# Patient Record
Sex: Female | Born: 1945 | Race: Black or African American | Hispanic: No | Marital: Married | Smoking: Never smoker
Health system: Southern US, Community
[De-identification: ages and names within clinical notes are randomized; demographics above are authoritative.]

## PROBLEM LIST (undated history)

## (undated) DIAGNOSIS — E119 Type 2 diabetes mellitus without complications: Secondary | ICD-10-CM

## (undated) DIAGNOSIS — H269 Unspecified cataract: Secondary | ICD-10-CM

## (undated) DIAGNOSIS — R011 Cardiac murmur, unspecified: Secondary | ICD-10-CM

## (undated) DIAGNOSIS — G459 Transient cerebral ischemic attack, unspecified: Secondary | ICD-10-CM

## (undated) DIAGNOSIS — I10 Essential (primary) hypertension: Secondary | ICD-10-CM

## (undated) DIAGNOSIS — D649 Anemia, unspecified: Secondary | ICD-10-CM

## (undated) HISTORY — DX: Anemia, unspecified: D64.9

## (undated) HISTORY — DX: Transient cerebral ischemic attack, unspecified: G45.9

## (undated) HISTORY — DX: Type 2 diabetes mellitus without complications: E11.9

## (undated) HISTORY — DX: Cardiac murmur, unspecified: R01.1

## (undated) HISTORY — DX: Essential (primary) hypertension: I10

## (undated) HISTORY — DX: Unspecified cataract: H26.9

---

## 2004-02-21 HISTORY — PX: DILATION AND CURETTAGE OF UTERUS: SHX78

## 2005-04-06 ENCOUNTER — Ambulatory Visit (HOSPITAL_COMMUNITY): Admission: RE | Admit: 2005-04-06 | Discharge: 2005-04-06 | Payer: Self-pay | Admitting: Family Medicine

## 2006-01-16 ENCOUNTER — Other Ambulatory Visit: Admission: RE | Admit: 2006-01-16 | Discharge: 2006-01-16 | Payer: Self-pay | Admitting: Obstetrics and Gynecology

## 2006-04-10 ENCOUNTER — Ambulatory Visit (HOSPITAL_COMMUNITY): Admission: RE | Admit: 2006-04-10 | Discharge: 2006-04-10 | Payer: Self-pay | Admitting: Family Medicine

## 2006-04-10 IMAGING — MG MM DIGITAL SCREENING BILAT
4 series · 4 of 4 positions shown · non-contrast
Comparison: none

DG SCREEN MAMMOGRAM BILATERAL
Bilateral CC and MLO view(s) were taken.
Prior study comparison: [DATE], bilateral screening mammogram.

DIGITAL SCREENING MAMMOGRAM WITH CAD:
There are scattered fibroglandular densities.  There is no dominant mass, architectural distortion 
or calcification to suggest malignancy.

[R CC]
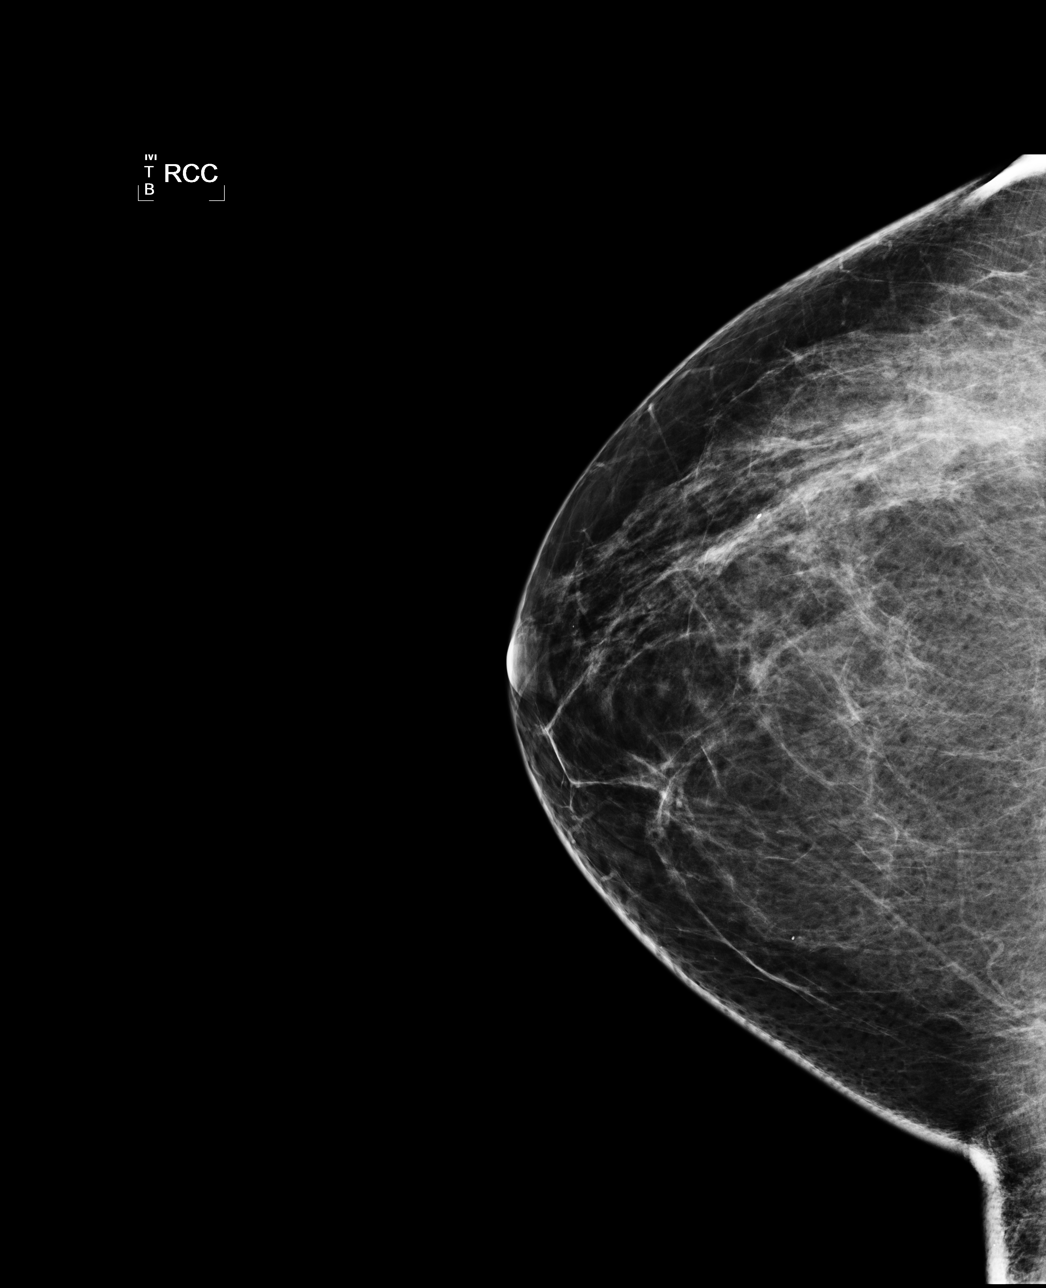

[R MLO]
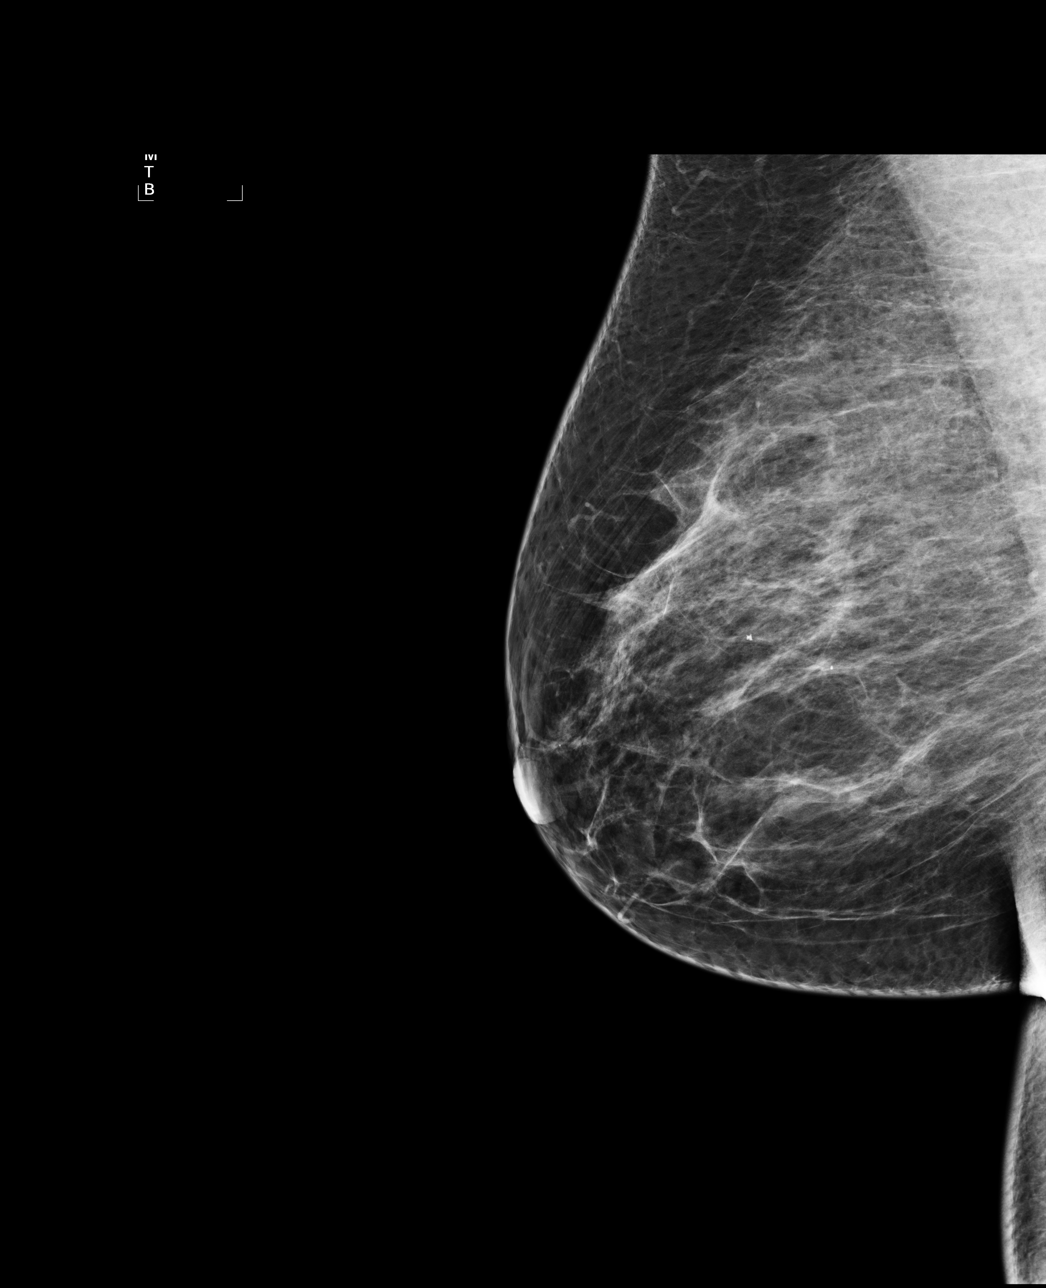

[L CC]
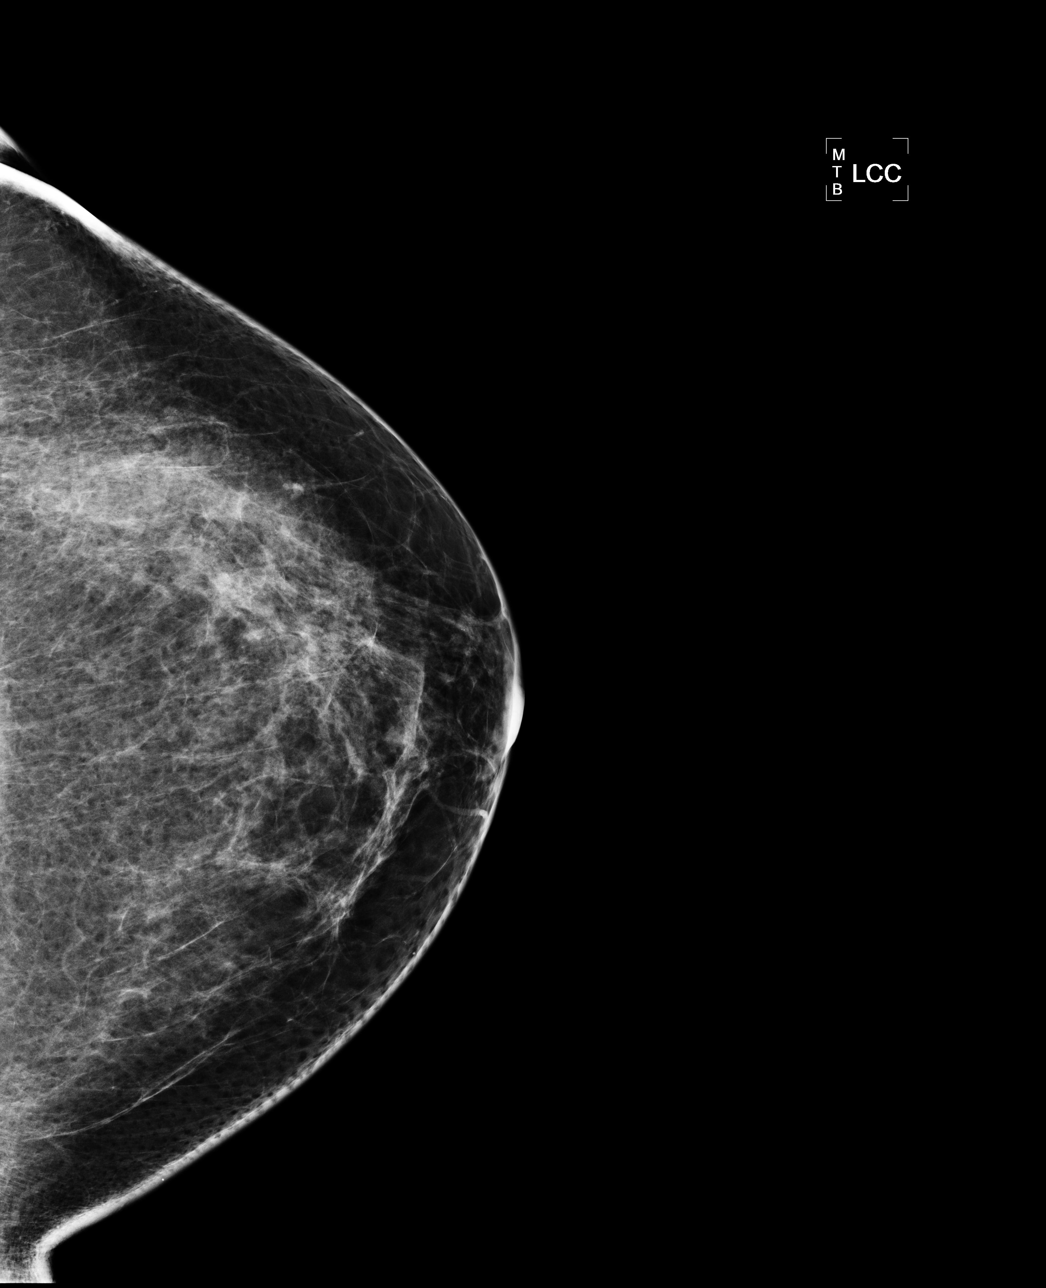

[L MLO]
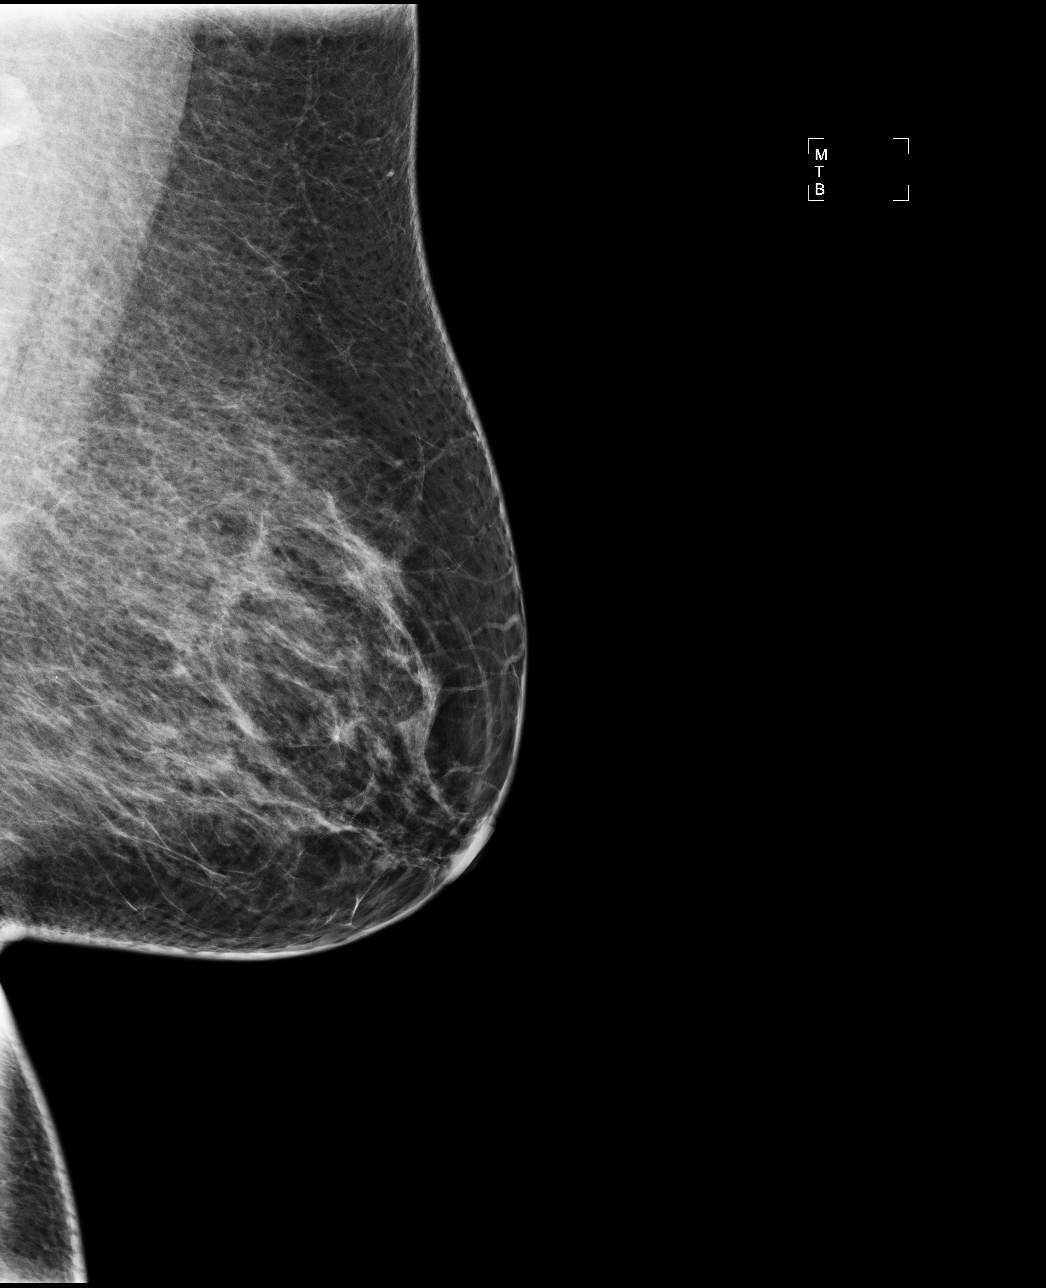

[4 of 4 positions shown; findings below may reference images not displayed]

IMPRESSION: No mammographic evidence of malignancy.  Suggest yearly screening mammography.

ASSESSMENT: Negative - BI-RADS 1

Screening mammogram in 1 year.
ANALYZED BY COMPUTER AIDED DETECTION. , THIS PROCEDURE WAS A DIGITAL MAMMOGRAM.

## 2007-04-12 ENCOUNTER — Ambulatory Visit (HOSPITAL_COMMUNITY): Admission: RE | Admit: 2007-04-12 | Discharge: 2007-04-12 | Payer: Self-pay | Admitting: Family Medicine

## 2007-04-12 IMAGING — MG MM DIGITAL SCREENING BILAT
4 series · 4 of 4 positions shown · non-contrast
Comparison: Prior studies.

DG SCREEN MAMMOGRAM BILATERAL
Bilateral CC and MLO view(s) were taken.

DIGITAL SCREENING MAMMOGRAM WITH CAD:

[R CC]
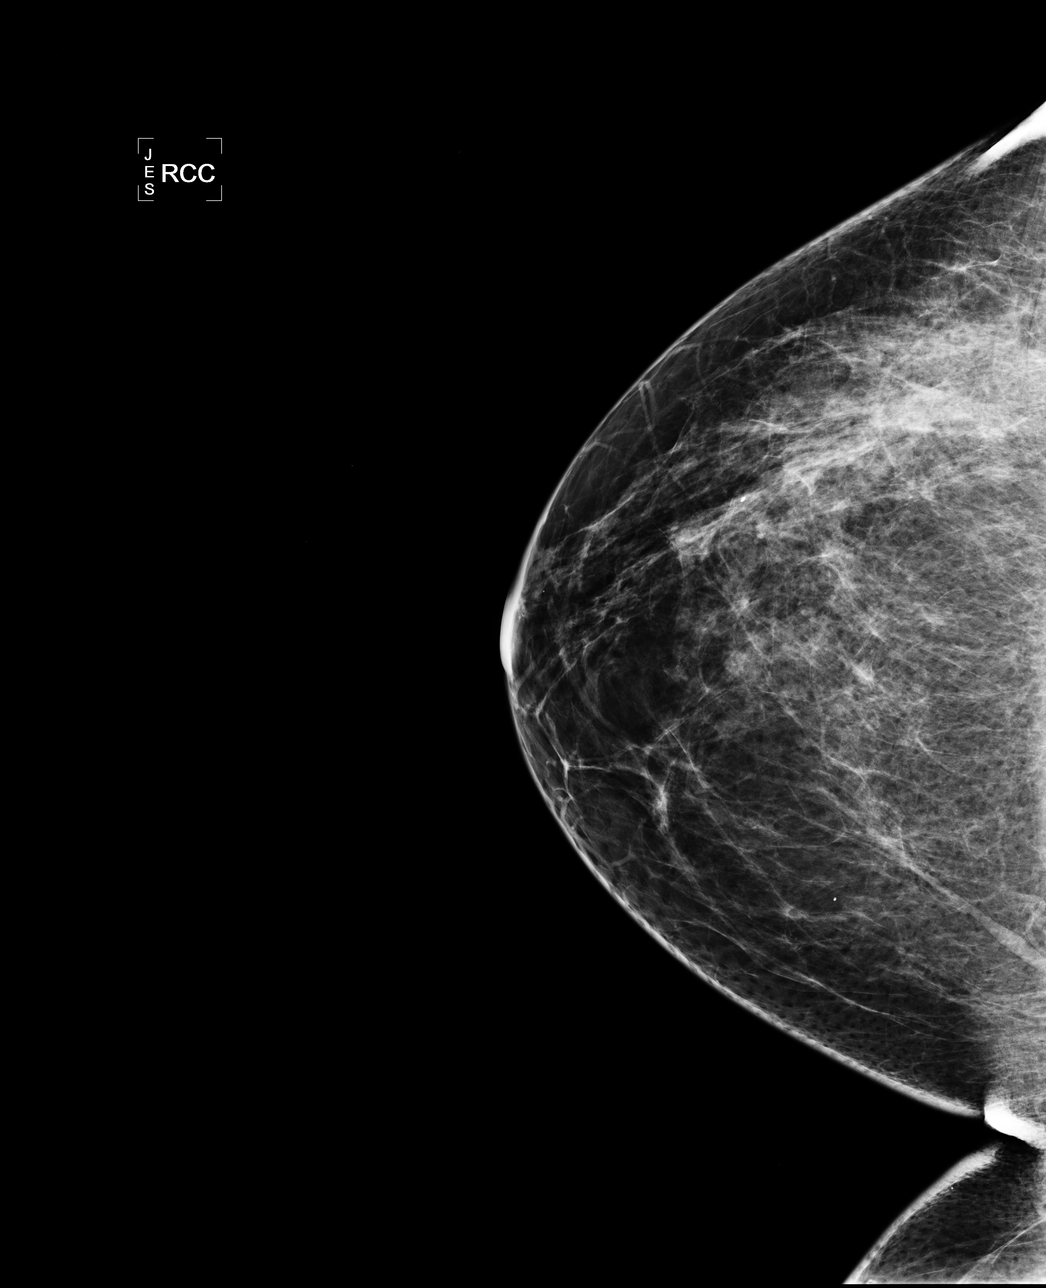

[R MLO]
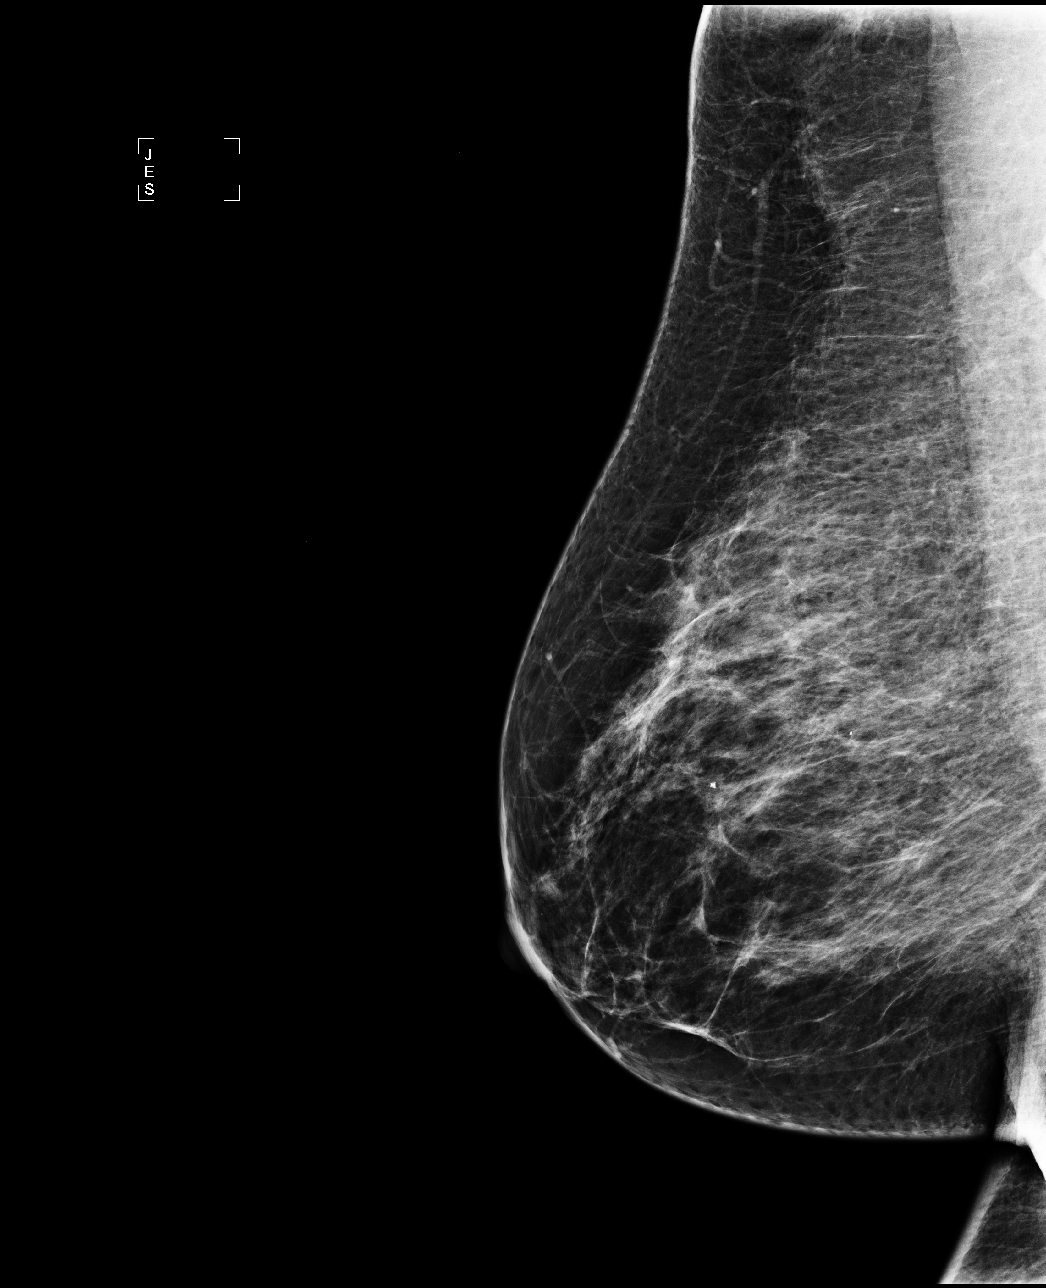

[L CC]
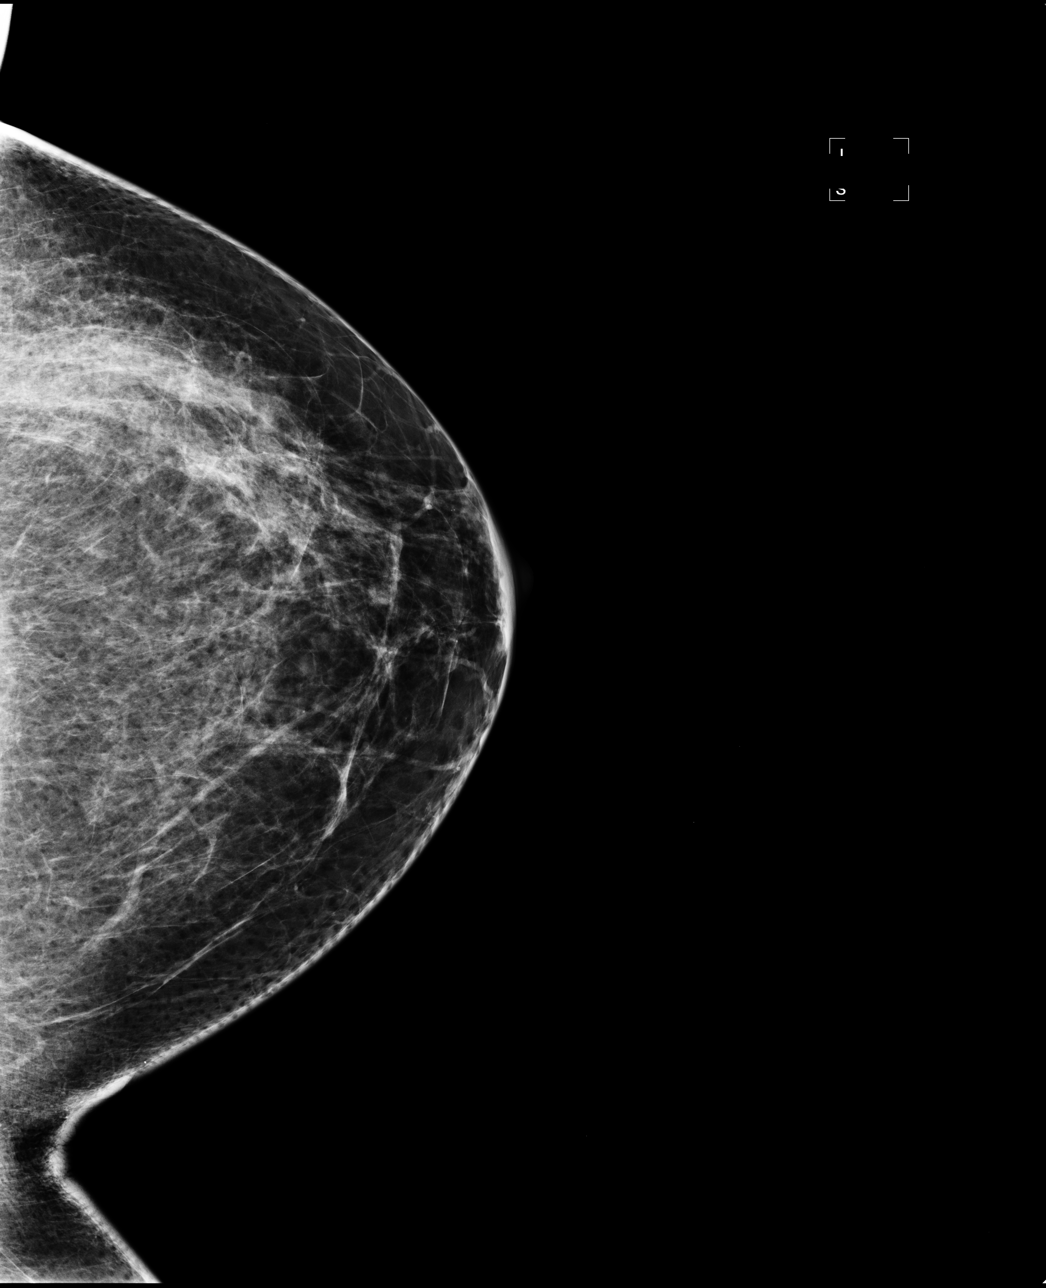

[L MLO]
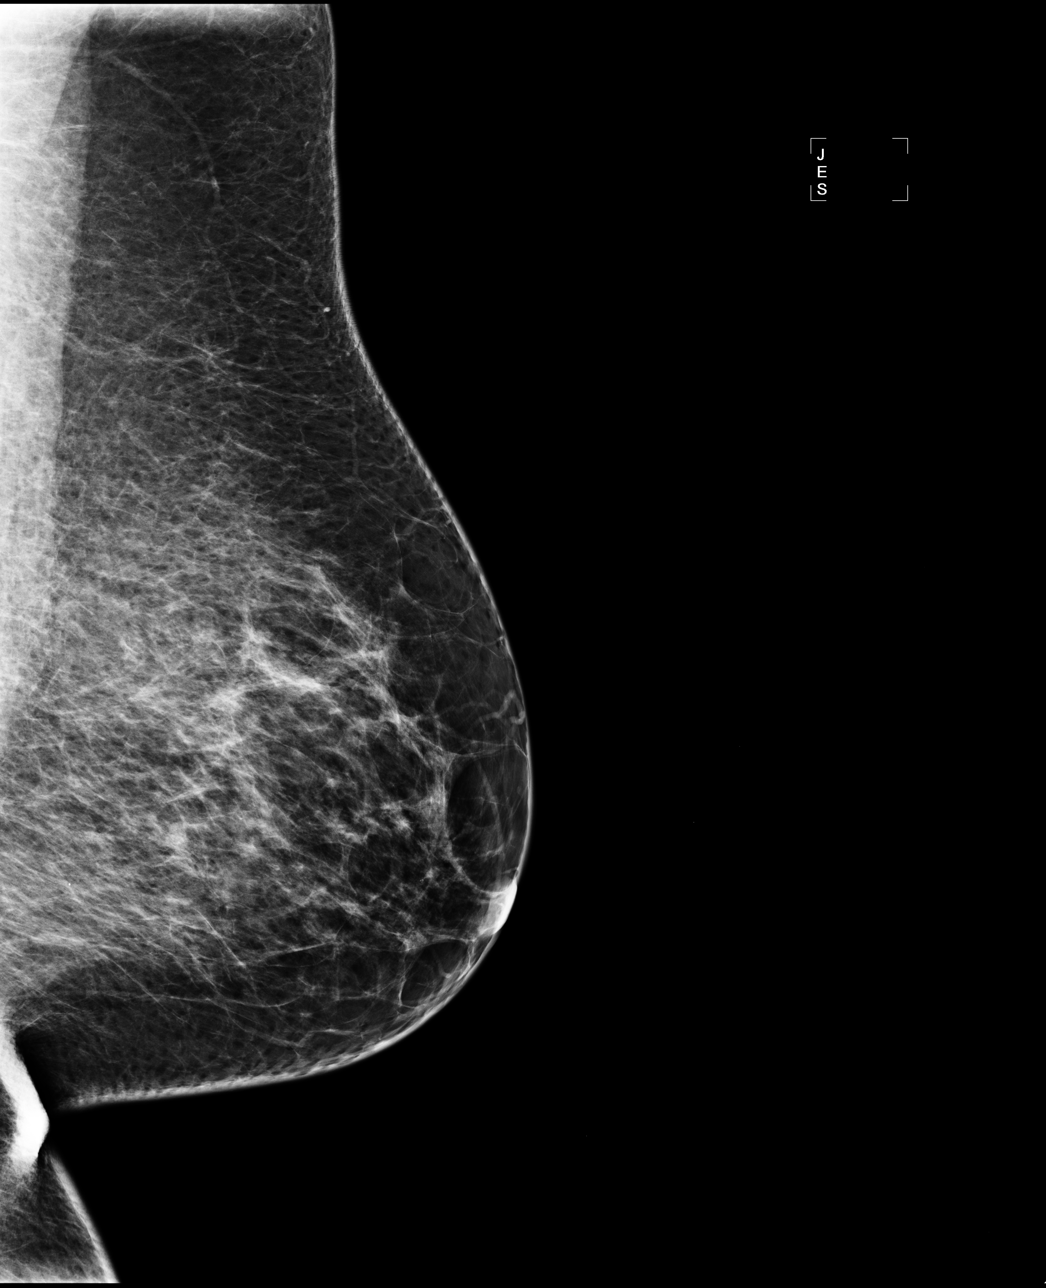

[4 of 4 positions shown; findings below may reference images not displayed]

There are scattered fibroglandular densities.  There is no dominant mass, architectural distortion 
or calcification to suggest malignancy.
IMPRESSION: No mammographic evidence of malignancy.  Suggest yearly screening mammography.

ASSESSMENT: Negative - BI-RADS 1

Screening mammogram in 1 year.
ANALYZED BY COMPUTER AIDED DETECTION. , THIS PROCEDURE WAS A DIGITAL MAMMOGRAM.

## 2008-02-07 ENCOUNTER — Other Ambulatory Visit: Admission: RE | Admit: 2008-02-07 | Discharge: 2008-02-07 | Payer: Self-pay | Admitting: Family Medicine

## 2008-04-13 ENCOUNTER — Ambulatory Visit (HOSPITAL_COMMUNITY): Admission: RE | Admit: 2008-04-13 | Discharge: 2008-04-13 | Payer: Self-pay | Admitting: Family Medicine

## 2008-04-13 IMAGING — MG MM DIGITAL SCREENING BILAT
5 series · 5 of 5 positions shown · non-contrast
Comparison: none

DG SCREEN MAMMOGRAM BILATERAL
Bilateral CC and MLO view(s) were taken.

DIGITAL SCREENING MAMMOGRAM WITH CAD:
There are scattered fibroglandular densities.  No masses or malignant type calcifications are 
identified.  Compared with prior studies.

[R CC]
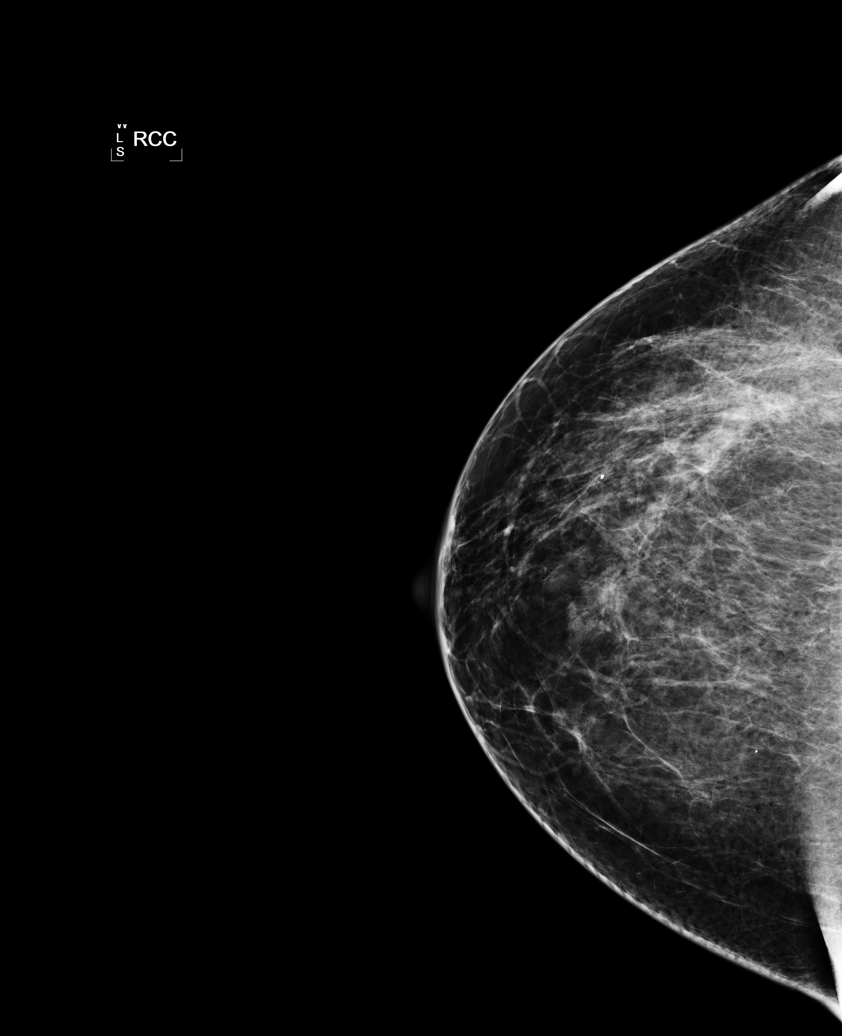

[R MLO (1 of 2)]
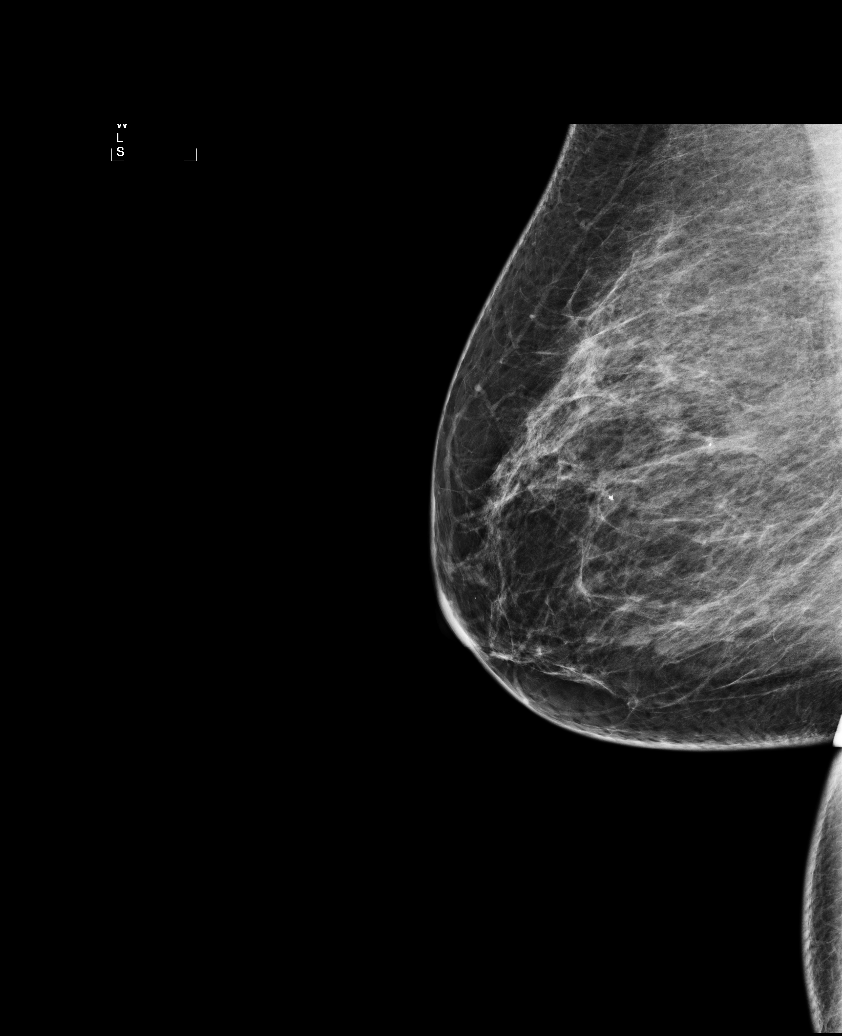

[L CC]
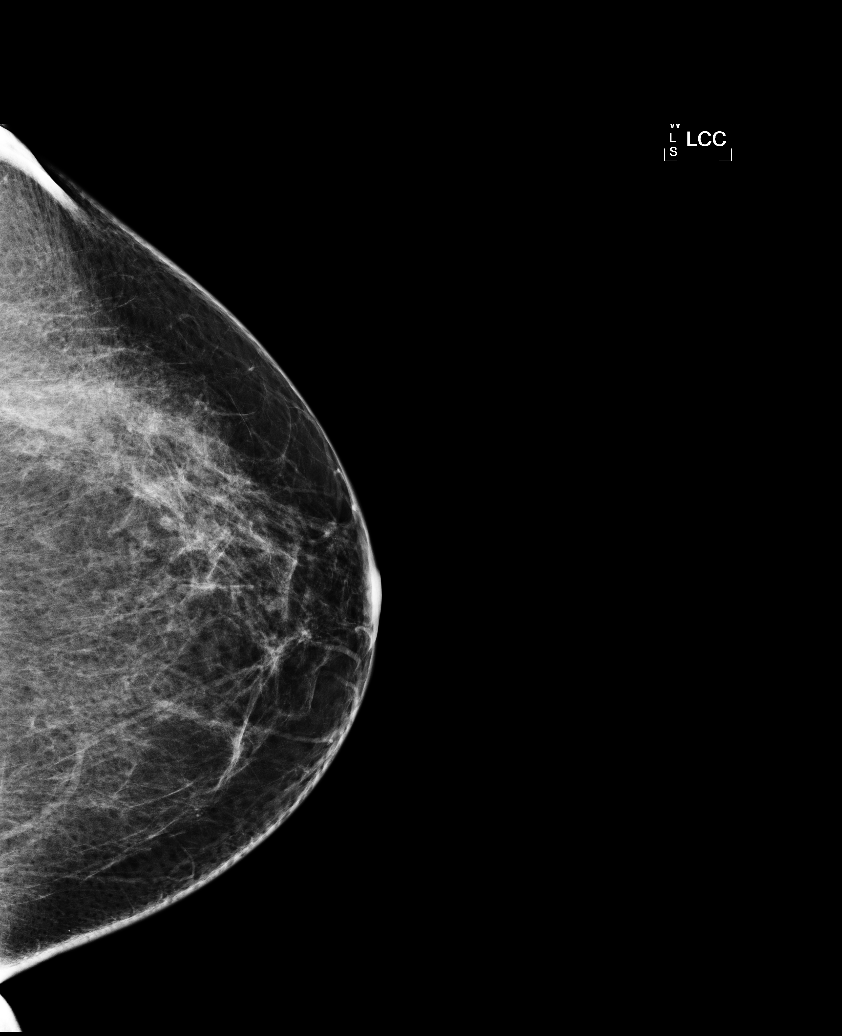

[L MLO]
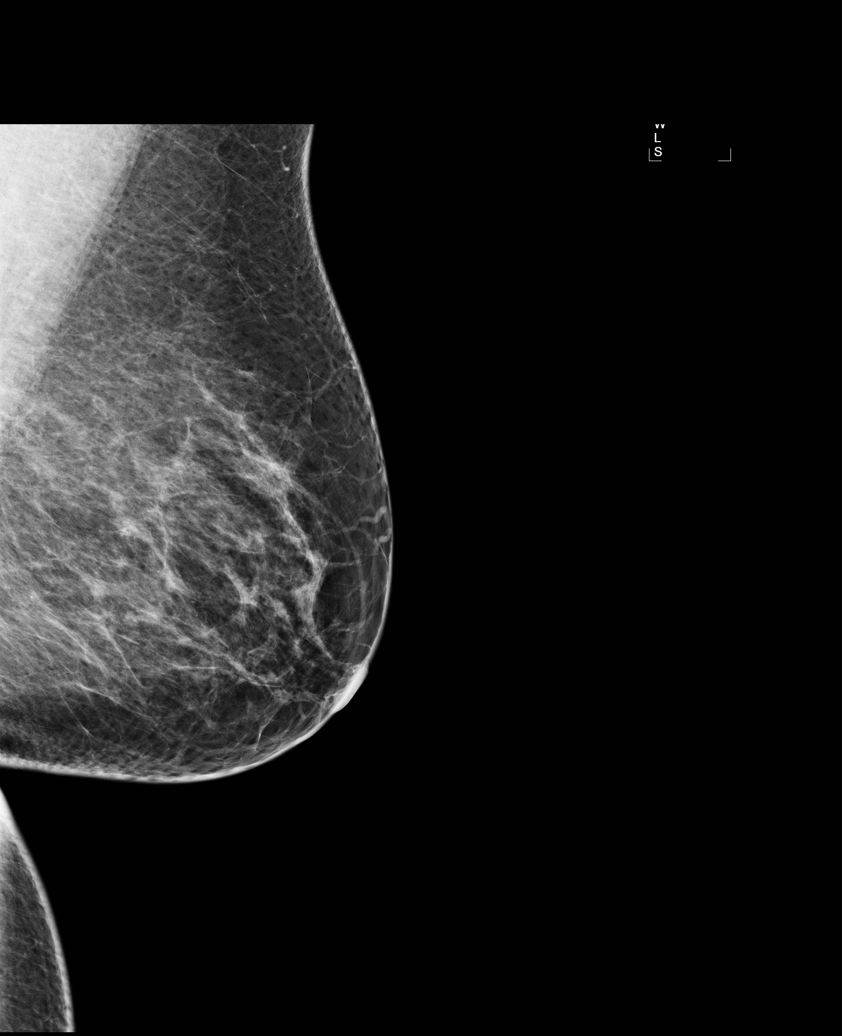

[R MLO (2 of 2)]
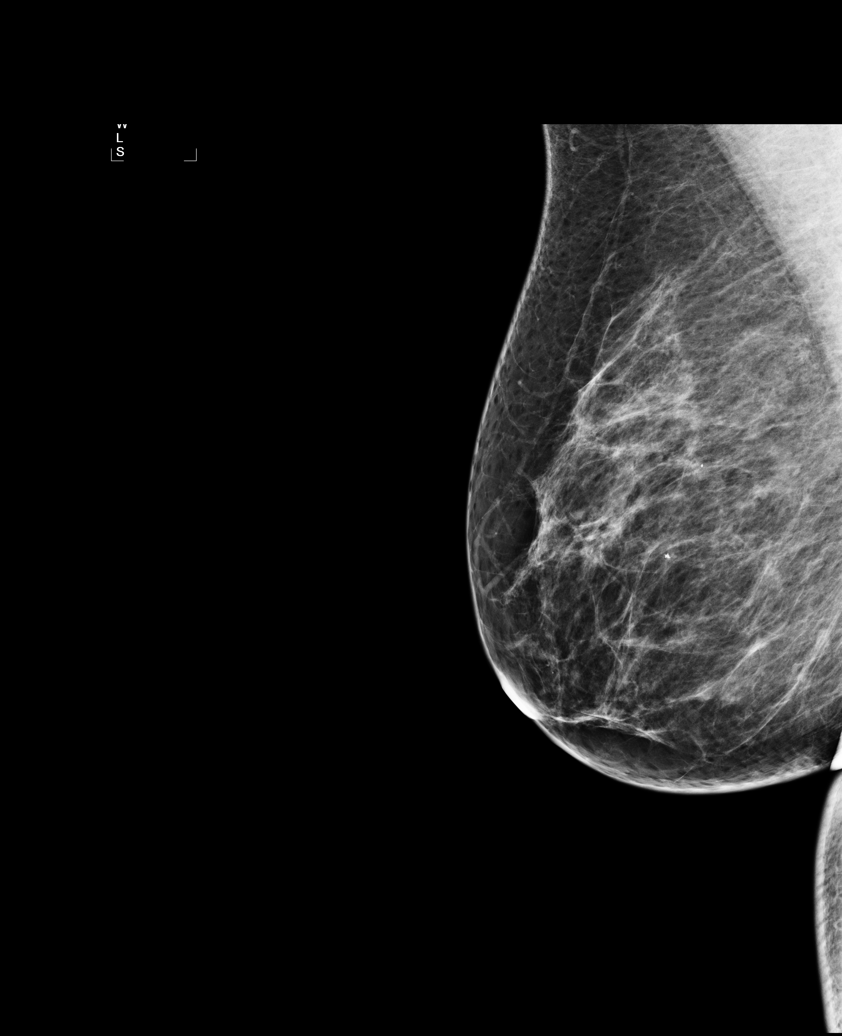

[5 of 5 positions shown; findings below may reference images not displayed]

IMPRESSION: No specific mammographic evidence of malignancy.  Next screening mammogram is recommended in one 
year.

ASSESSMENT: Negative - BI-RADS 1

Screening mammogram in 1 year.
ANALYZED BY COMPUTER AIDED DETECTION. , THIS PROCEDURE WAS A DIGITAL MAMMOGRAM.

## 2009-03-31 ENCOUNTER — Other Ambulatory Visit: Admission: RE | Admit: 2009-03-31 | Discharge: 2009-03-31 | Payer: Self-pay | Admitting: Family Medicine

## 2009-04-14 ENCOUNTER — Ambulatory Visit (HOSPITAL_COMMUNITY): Admission: RE | Admit: 2009-04-14 | Discharge: 2009-04-14 | Payer: Self-pay | Admitting: Family Medicine

## 2009-04-14 IMAGING — MG MM DIGITAL SCREENING
4 series · 4 of 4 positions shown · non-contrast
Comparison: Prior studies.

DG SCREEN MAMMOGRAM BILATERAL
Bilateral CC and MLO view(s) were taken.
Prior study comparison: [DATE], DG screen mammogram bilateral.

DIGITAL SCREENING MAMMOGRAM WITH CAD:

[R CC]
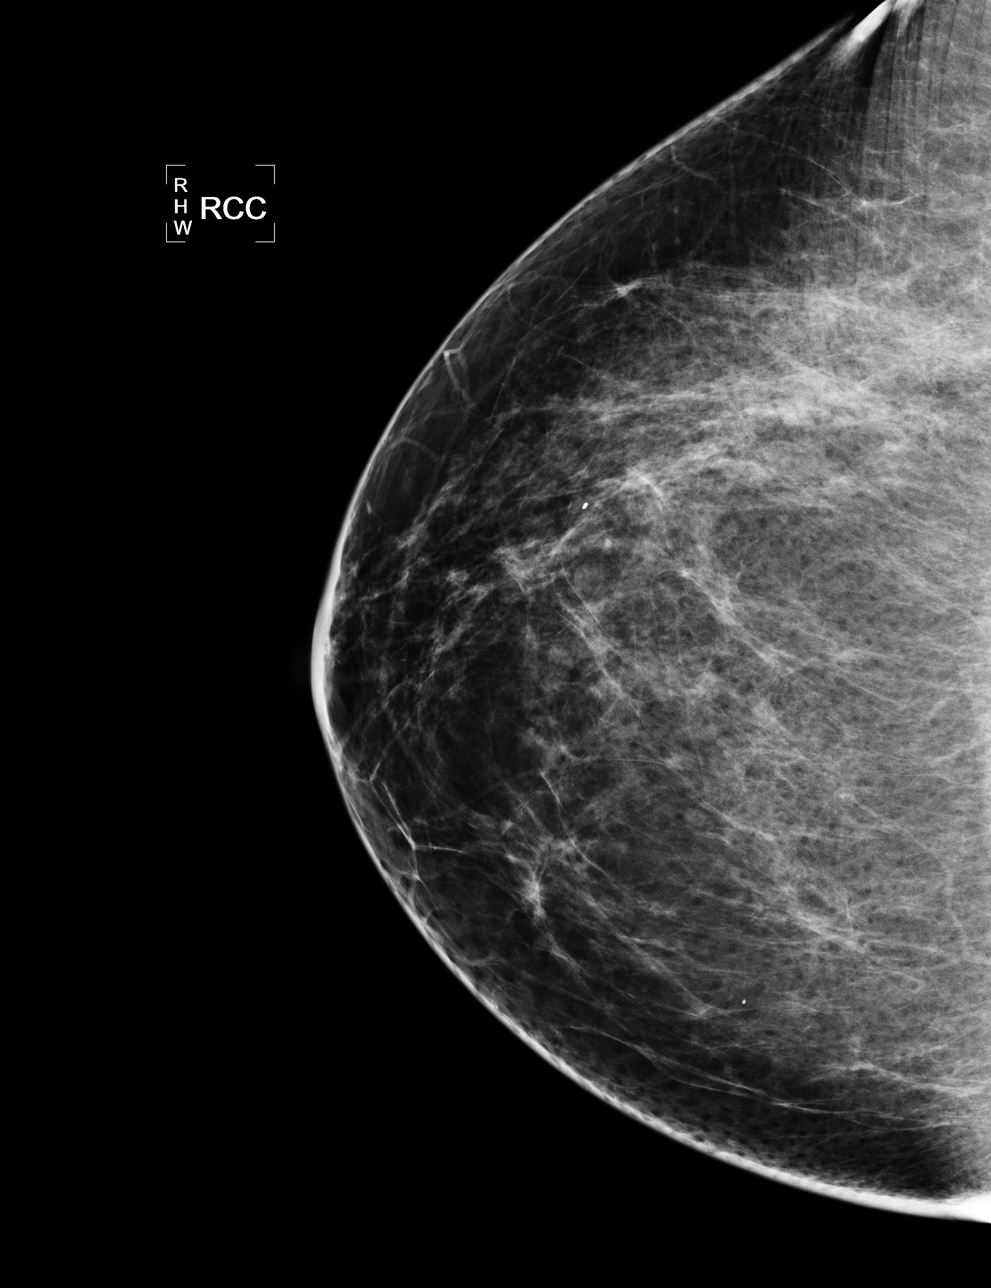

[R MLO]
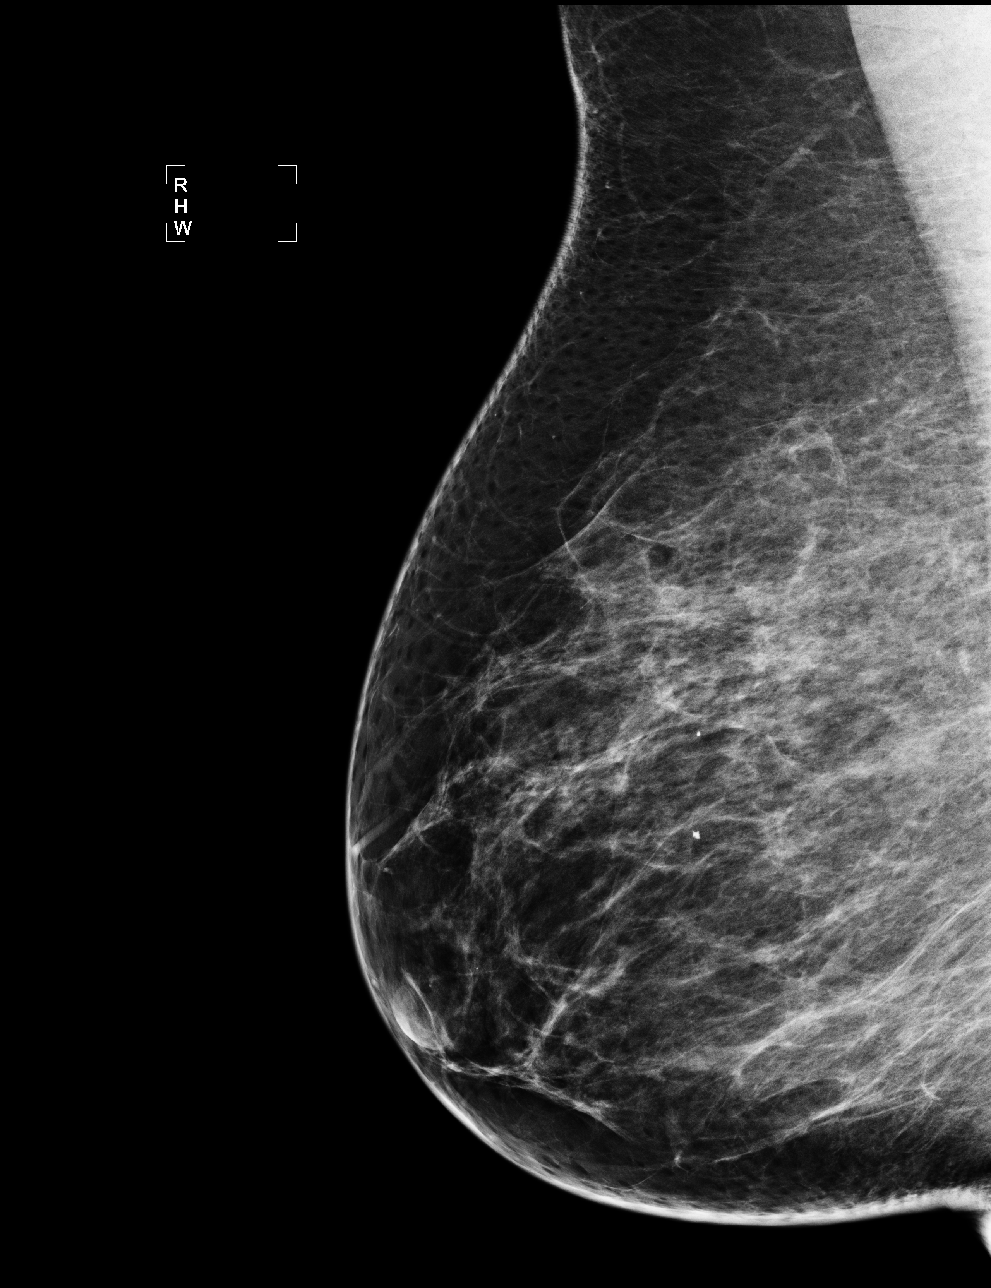

[L CC]
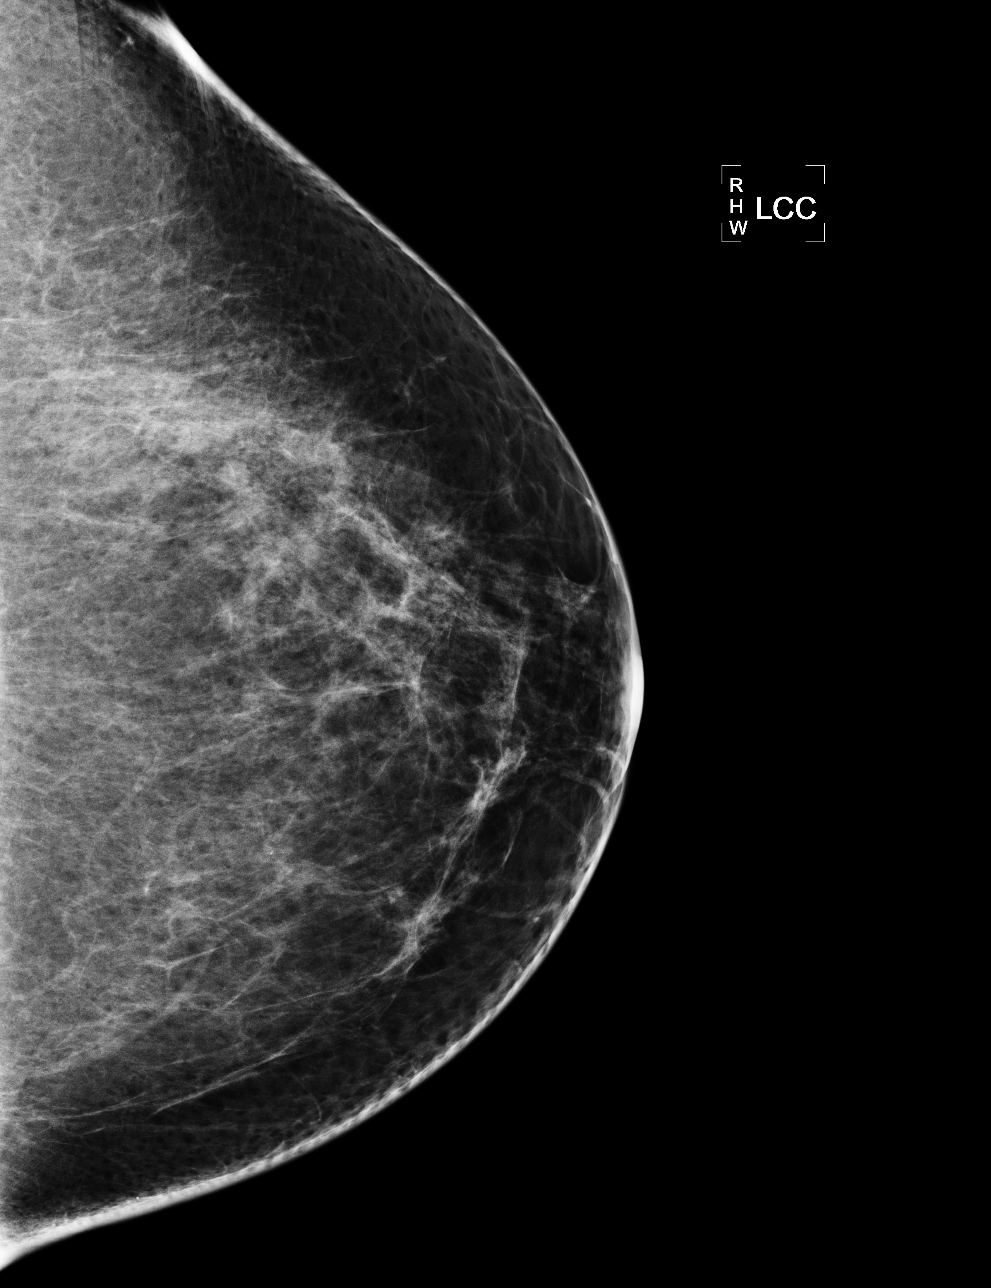

[L MLO]
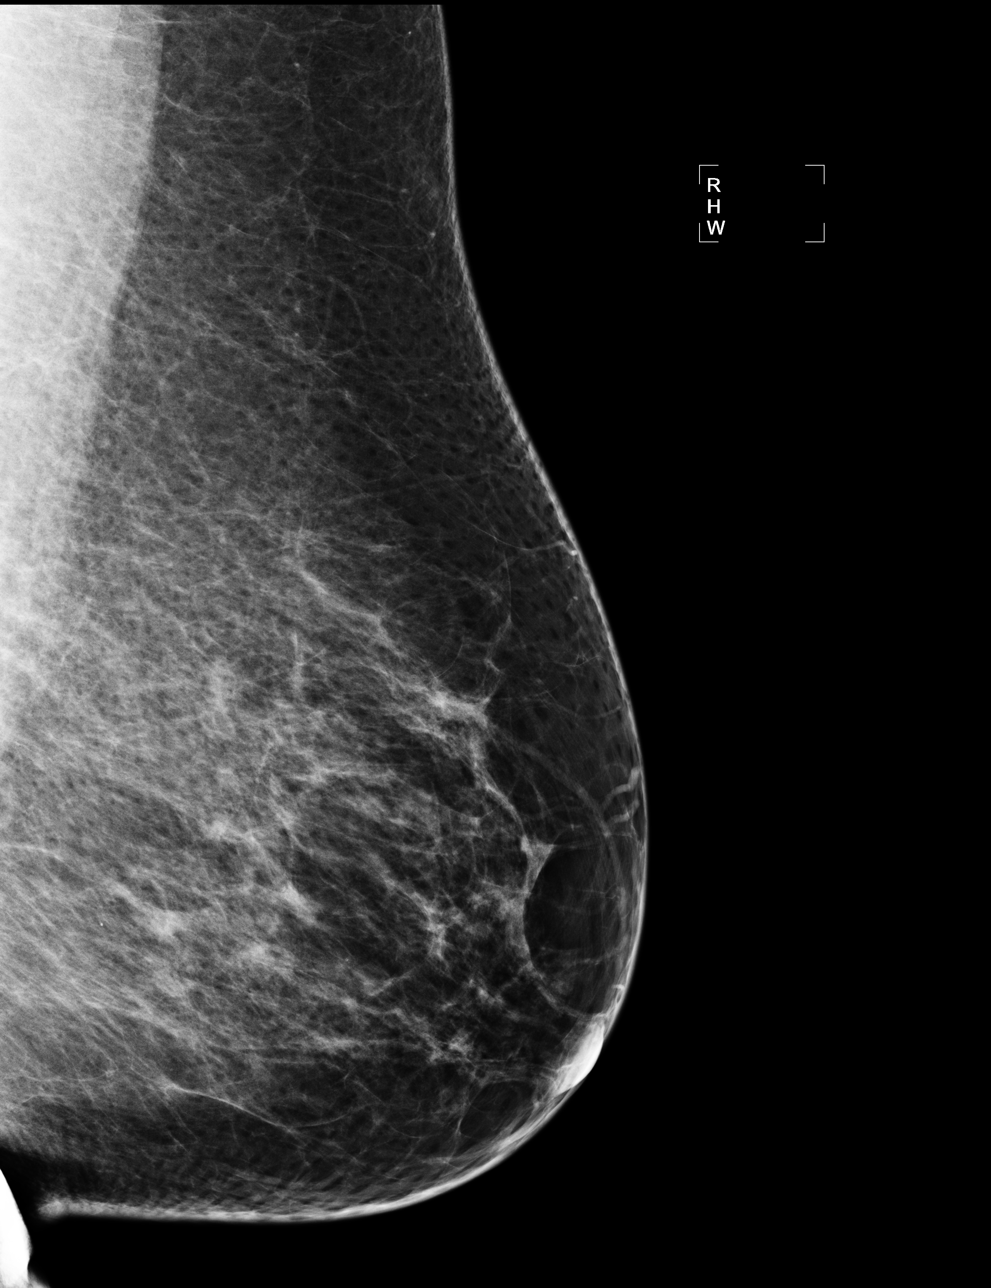

[4 of 4 positions shown; findings below may reference images not displayed]

There are scattered fibroglandular densities.  A possible mass is noted in the left breast.  Spot 
compression views and possibly sonography are recommended for further evaluation.  The right breast
is unremarkable.

Images were processed with CAD.
IMPRESSION: Possible mass, left breast.  Additional evaluation is indicated.  The patient will be contacted for
additional studies and a supplemental report will follow.

ASSESSMENT: Need additional imaging evaluation and/or prior mammograms for comparison - BI-RADS 0 -
Left

Further imaging of the left breast.
,

## 2009-04-20 ENCOUNTER — Encounter: Admission: RE | Admit: 2009-04-20 | Discharge: 2009-04-20 | Payer: Self-pay | Admitting: Family Medicine

## 2009-04-20 IMAGING — MG MM DIGITAL DIAG LTD L
3 series · 3 of 3 positions shown · non-contrast
Comparison: [DATE] and [DATE]

CLINICAL DATA: Possible mass left breast identified on recent
screening mammogram.

DIGITAL DIAGNOSTIC LEFT MAMMOGRAM WITHOUT CAD AND LEFT BREAST
ULTRASOUND:

[L CC (1 of 2)]
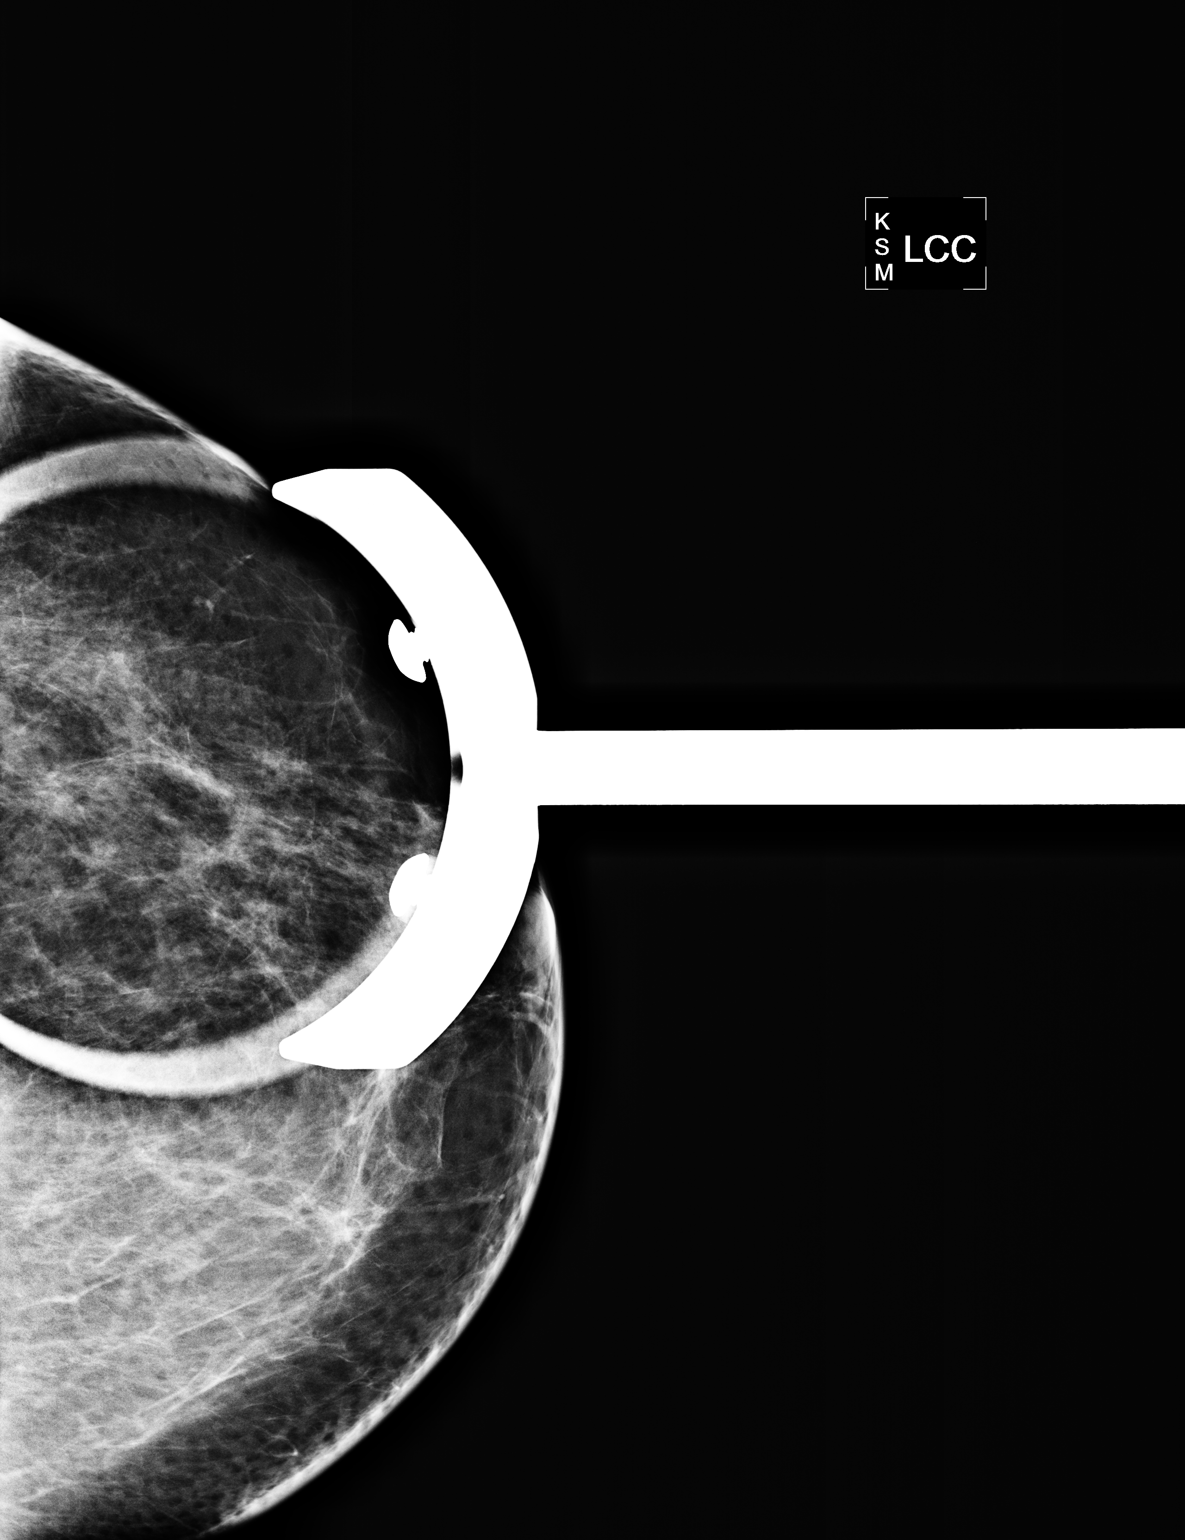

[L CC (2 of 2)]
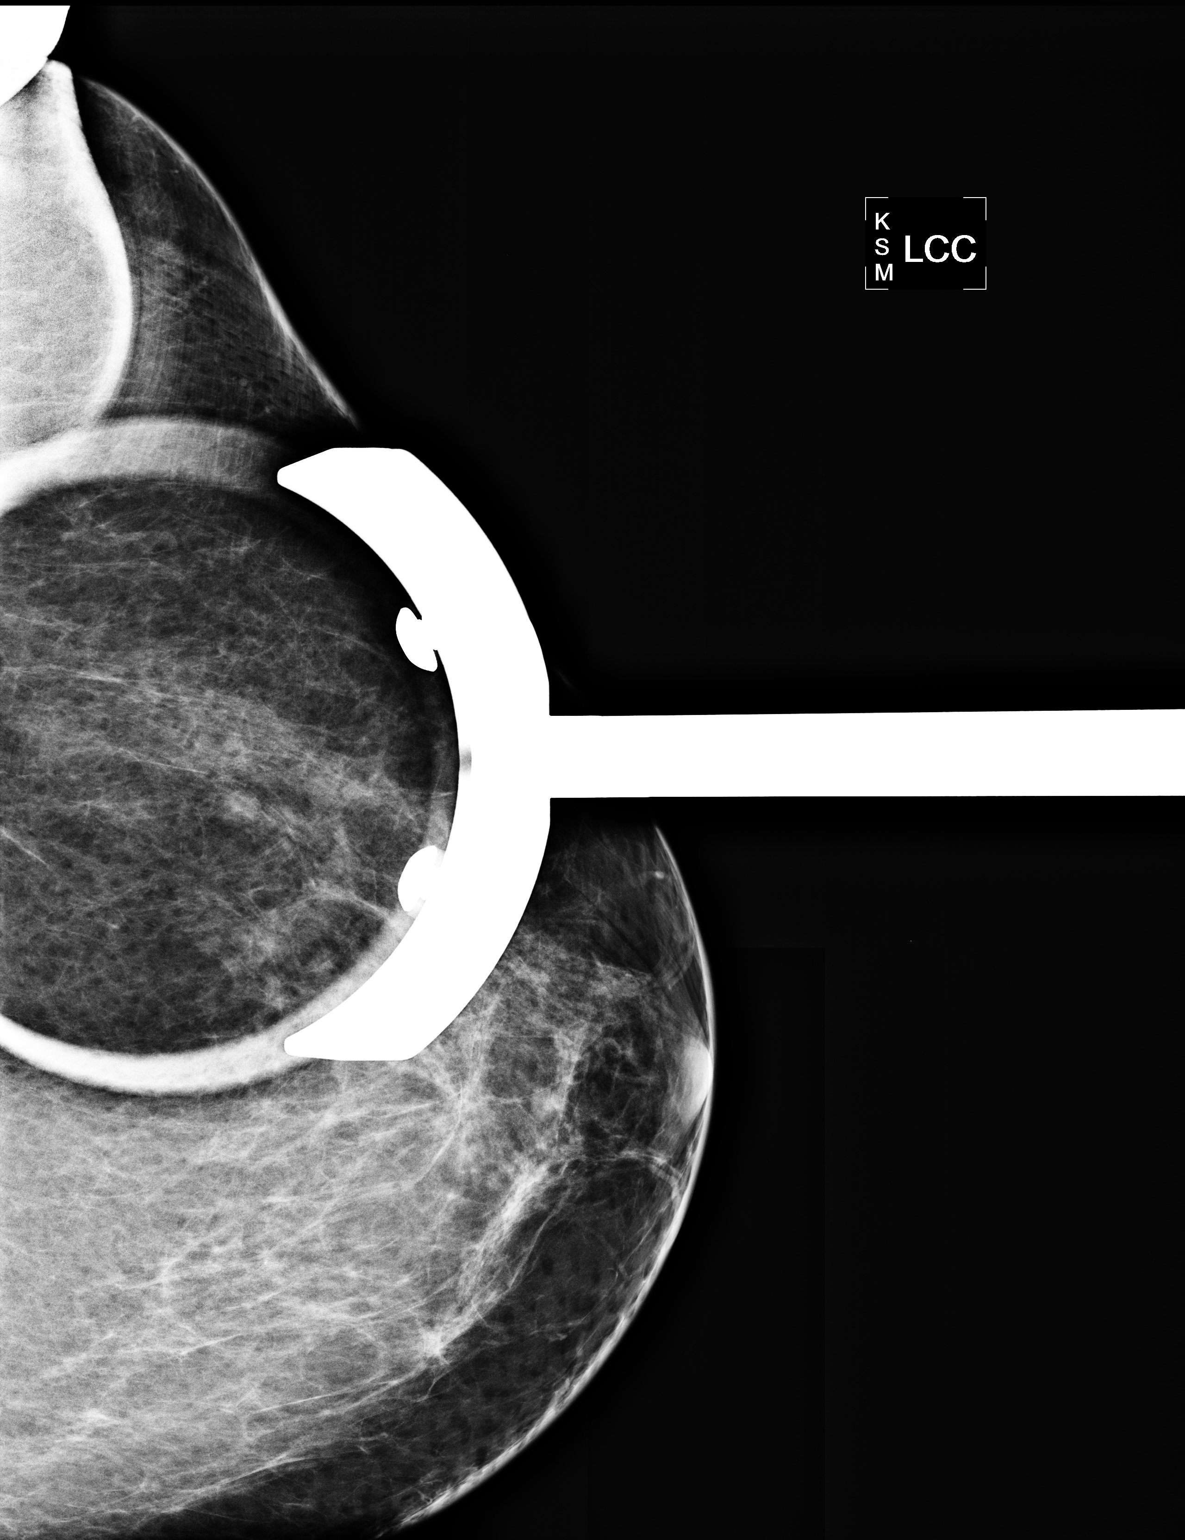

[L MLO]
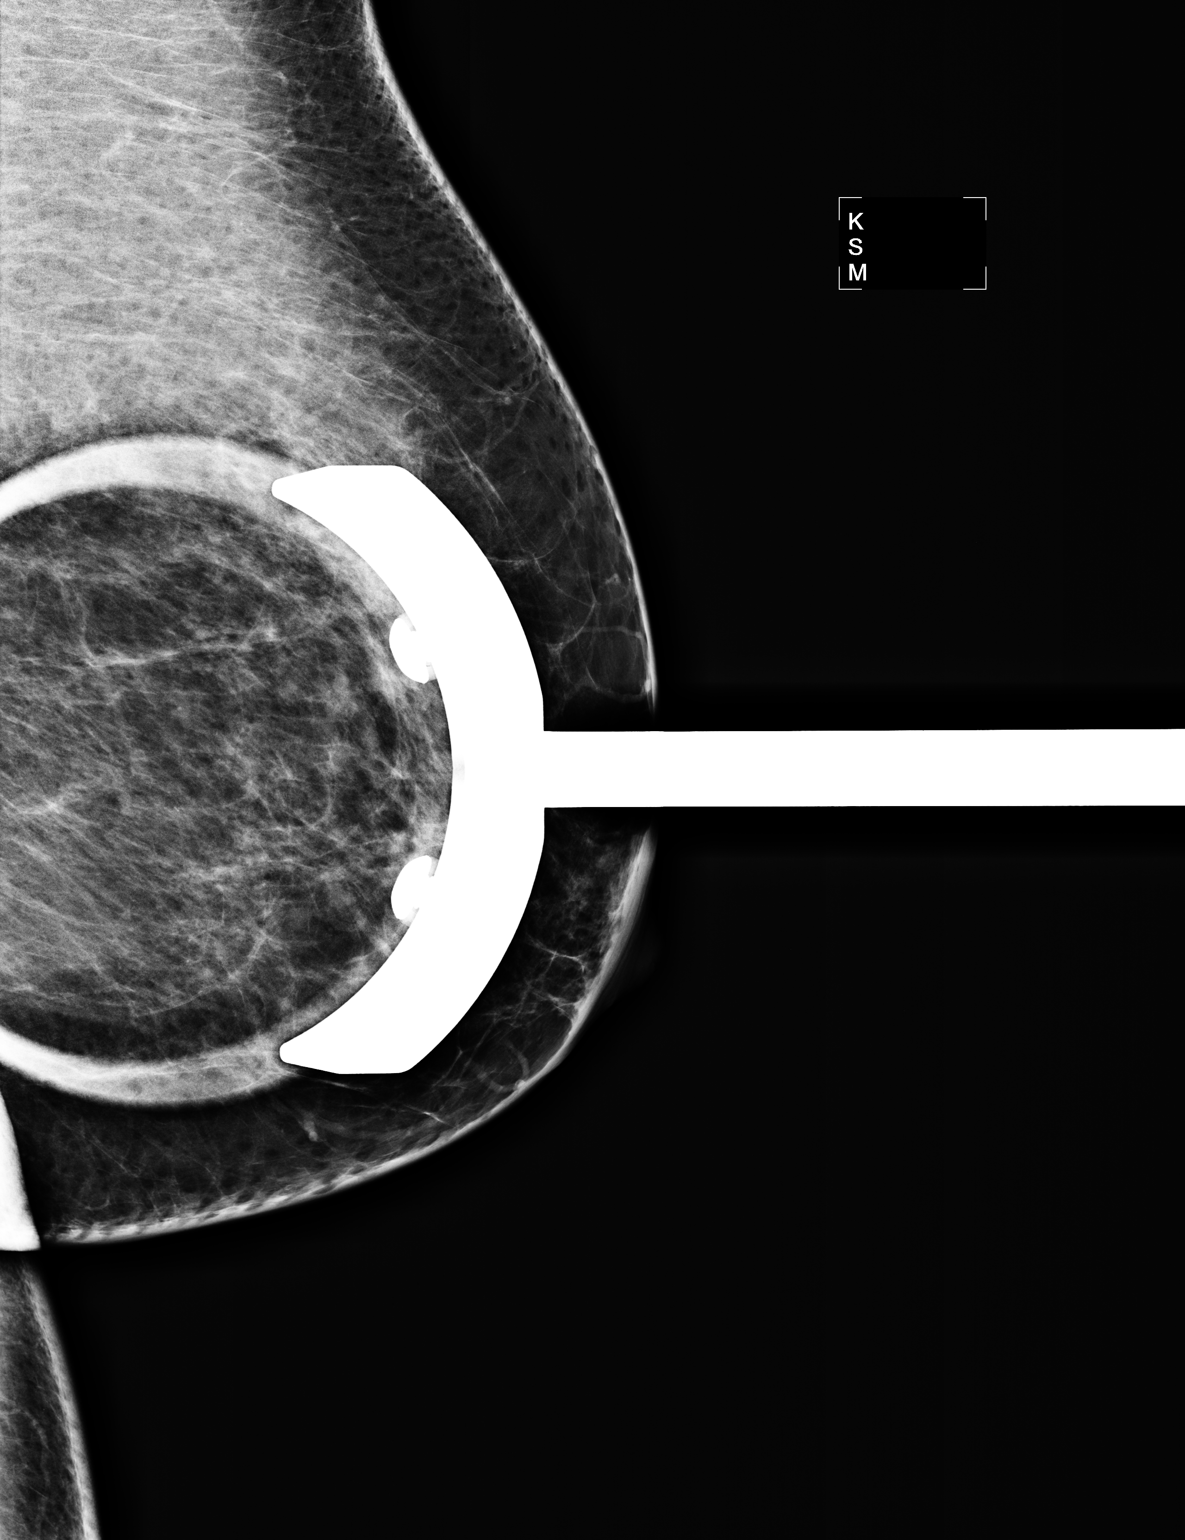

[3 of 3 positions shown; findings below may reference images not displayed]

FINDINGS: Focal spot compression views of the outer left breast at
the level of the nipple show scattered fibroglandular densities.
No discrete mass is identified.

On physical exam, no mass or thickening is palpated in the outer
left breast.

Ultrasound is performed, showing a few scattered tiny cysts in the
outer left breast in the 3 o'clock region.   At the 3 o'clock
position 4 cm from the nipple is a 5 mm cyst.  No solid mass or
acoustical shadowing is identified.
IMPRESSION: No evidence of malignancy is identified in the left breast.  A few
sub-centimeter cysts are seen in the 3 o'clock region of the left
breast.  Screening mammogram of both breasts is recommended in 1
year.

BI-RADS CATEGORY 2:  Benign finding(s).

## 2009-04-20 IMAGING — US US BREAST L
1 series · 5 of 5 positions shown · non-contrast
Comparison: [DATE] and [DATE]

CLINICAL DATA: Possible mass left breast identified on recent
screening mammogram.

DIGITAL DIAGNOSTIC LEFT MAMMOGRAM WITHOUT CAD AND LEFT BREAST
ULTRASOUND:

[Series 1: us breast left · 5 of 5 slices shown]
[im 1/5]
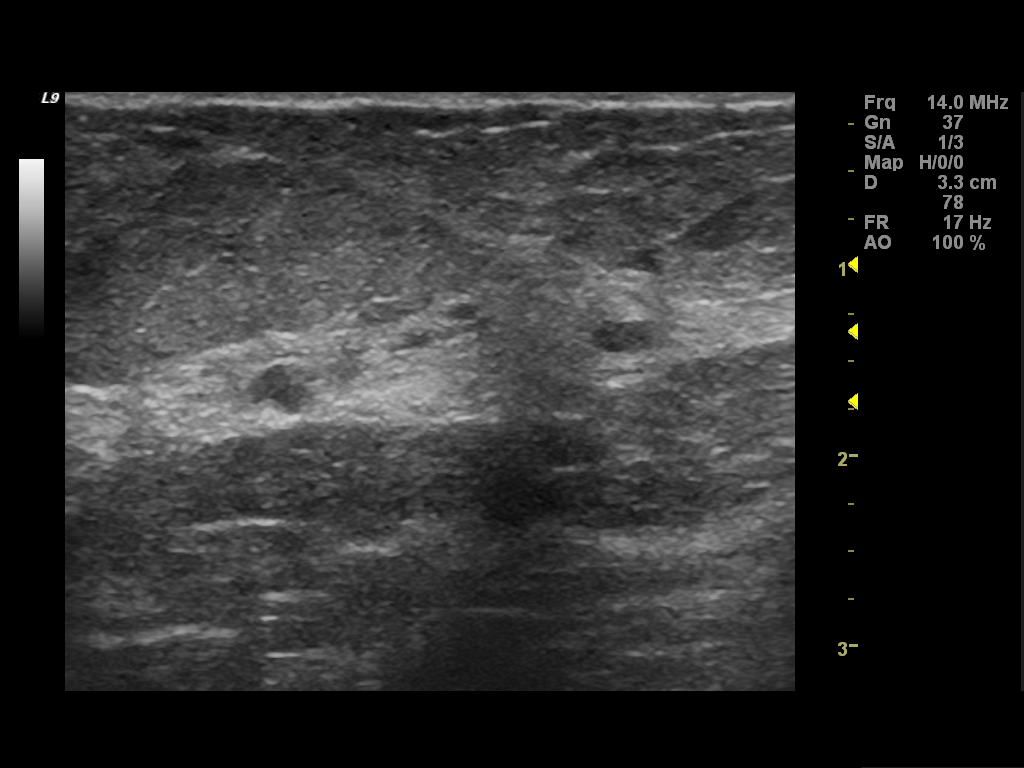
[im 2/5]
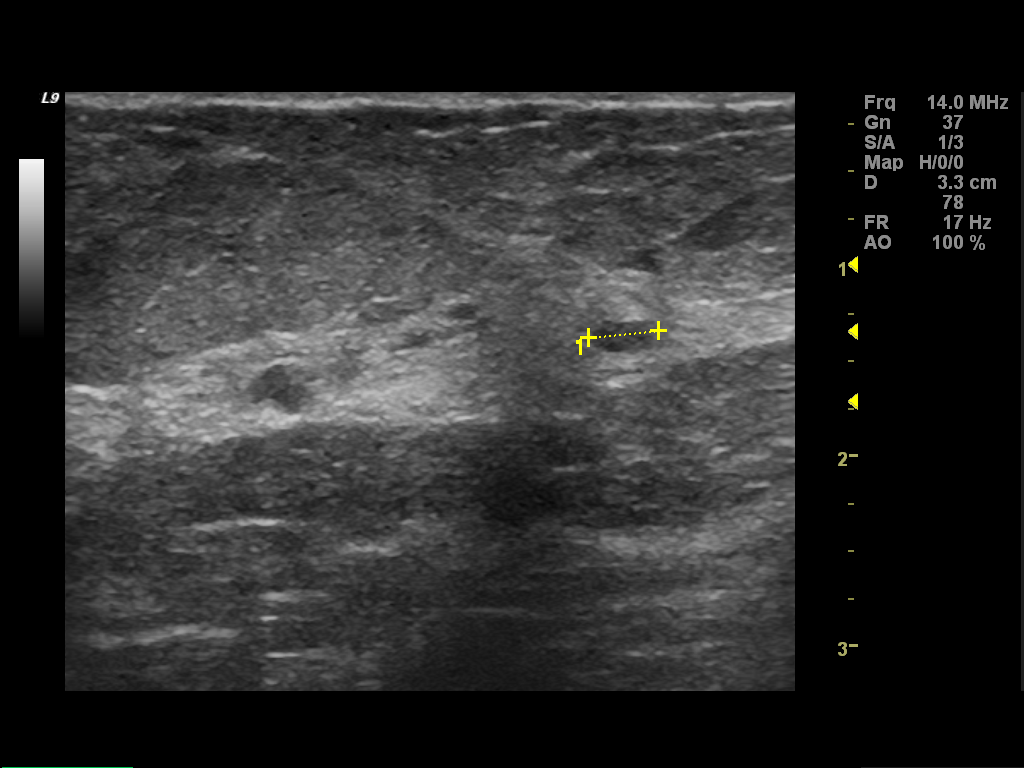
[im 3/5]
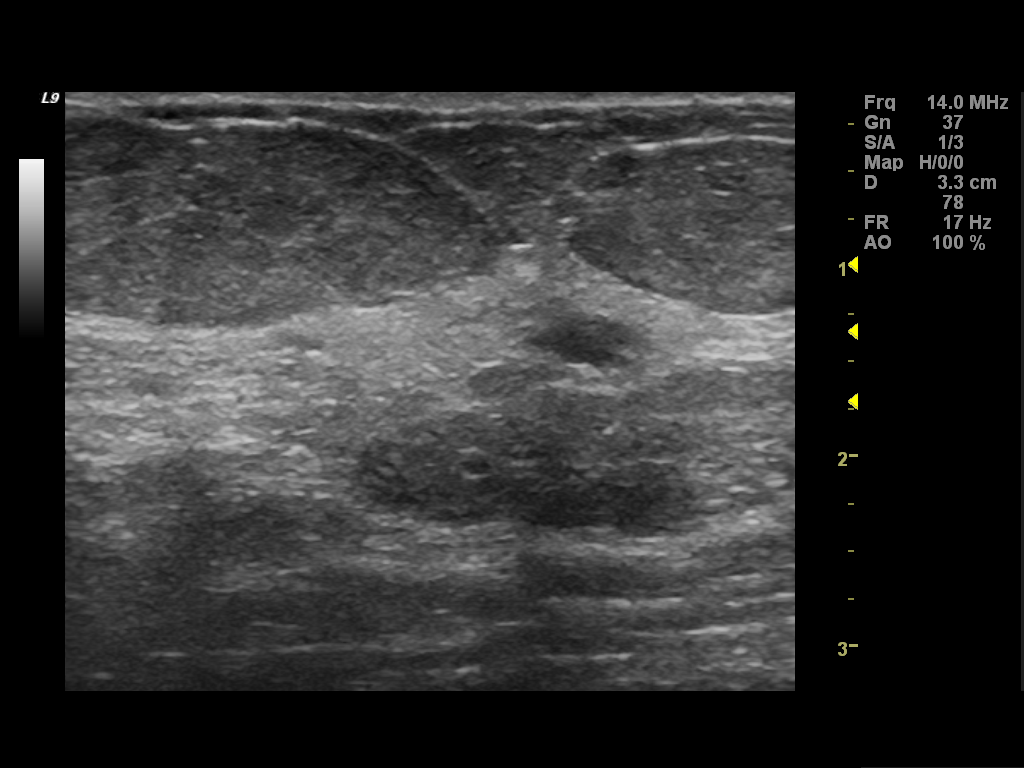
[im 4/5]
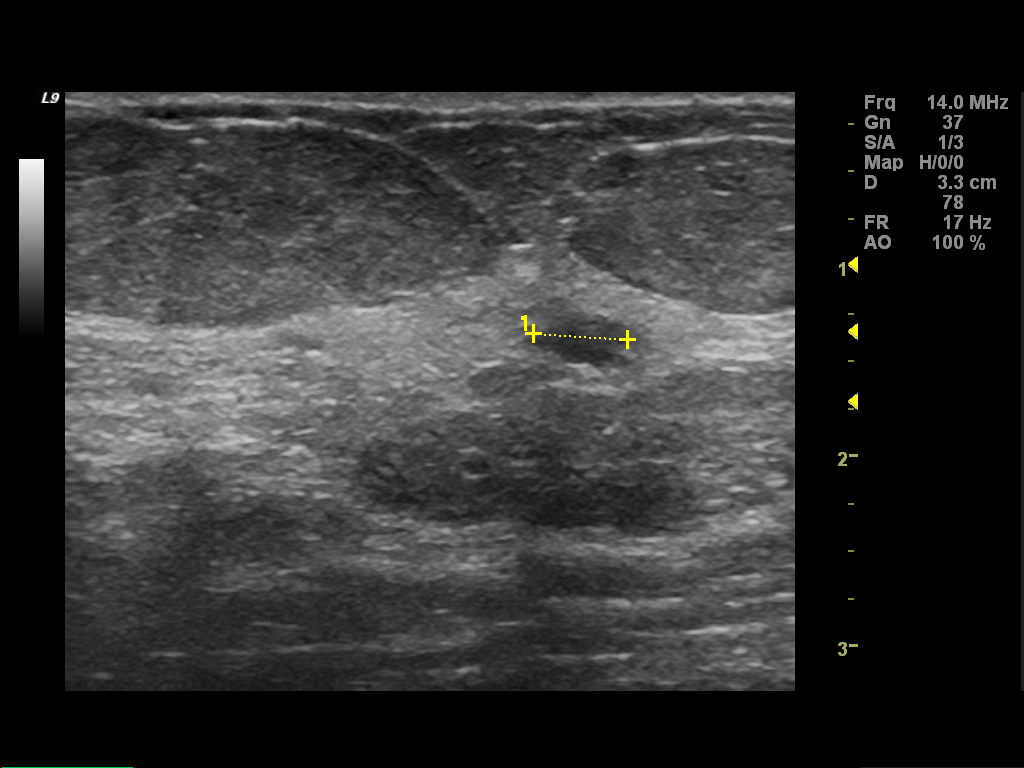
[im 5/5]
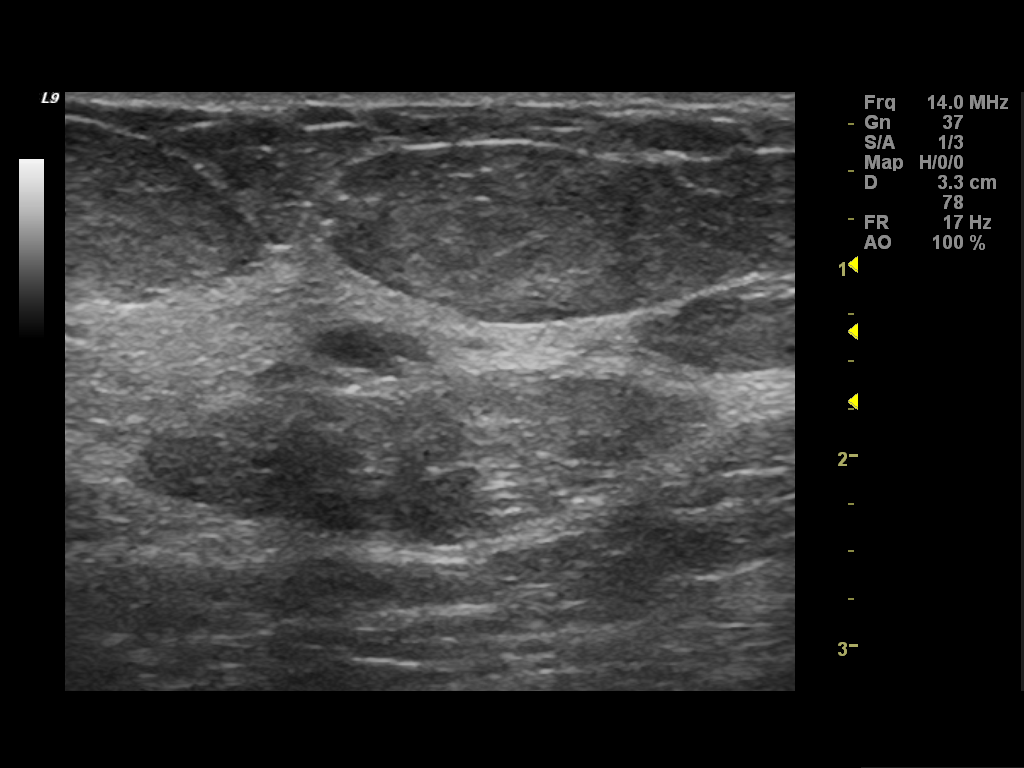

[5 of 5 positions shown; findings below may reference images not displayed]

FINDINGS: Focal spot compression views of the outer left breast at
the level of the nipple show scattered fibroglandular densities.
No discrete mass is identified.

On physical exam, no mass or thickening is palpated in the outer
left breast.

Ultrasound is performed, showing a few scattered tiny cysts in the
outer left breast in the 3 o'clock region.   At the 3 o'clock
position 4 cm from the nipple is a 5 mm cyst.  No solid mass or
acoustical shadowing is identified.
IMPRESSION: No evidence of malignancy is identified in the left breast.  A few
sub-centimeter cysts are seen in the 3 o'clock region of the left
breast.  Screening mammogram of both breasts is recommended in 1
year.

BI-RADS CATEGORY 2:  Benign finding(s).

## 2010-03-12 ENCOUNTER — Other Ambulatory Visit: Payer: Self-pay | Admitting: Family Medicine

## 2010-03-12 DIAGNOSIS — Z1231 Encounter for screening mammogram for malignant neoplasm of breast: Secondary | ICD-10-CM

## 2010-04-15 ENCOUNTER — Ambulatory Visit
Admission: RE | Admit: 2010-04-15 | Discharge: 2010-04-15 | Disposition: A | Discharge status: Home / Self Care | Payer: 59 | Source: Ambulatory Visit | Attending: Family Medicine | Admitting: Family Medicine

## 2010-04-15 DIAGNOSIS — Z1231 Encounter for screening mammogram for malignant neoplasm of breast: Secondary | ICD-10-CM

## 2010-04-15 IMAGING — MG MM DIGITAL SCREENING {BCG}
4 series · 4 of 4 positions shown · non-contrast
Comparison: none

DG SCREEN MAMMOGRAM BILATERAL
Bilateral CC and MLO view(s) were taken.

DIGITAL SCREENING MAMMOGRAM WITH CAD:
There are scattered fibroglandular densities.  No masses or malignant type calcifications are 
identified.  Compared with prior studies.
Images were processed with CAD.

[R CC]
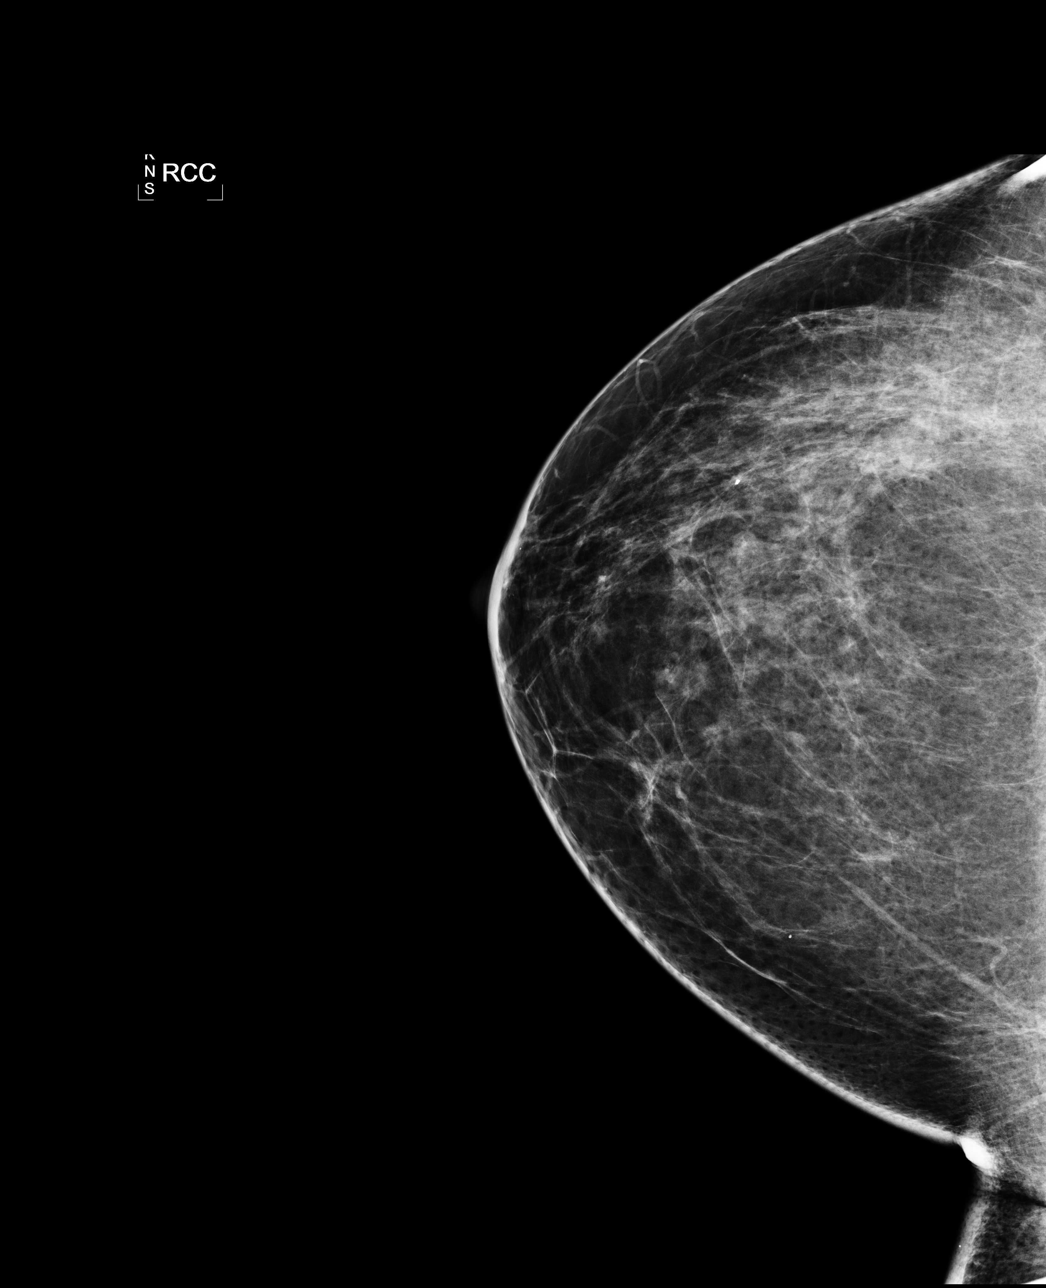

[L CC]
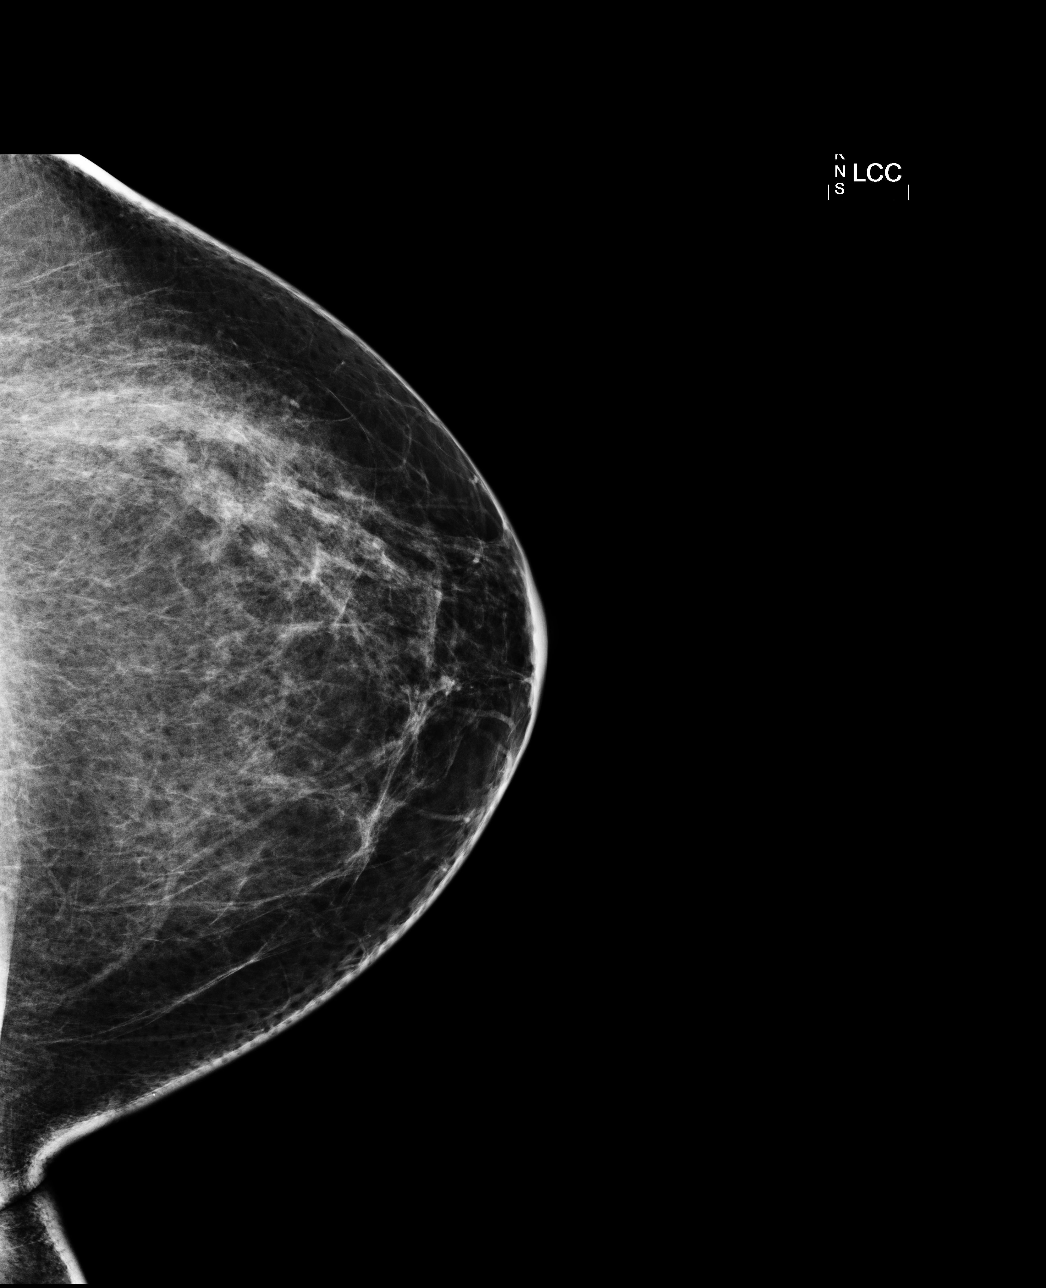

[L MLO]
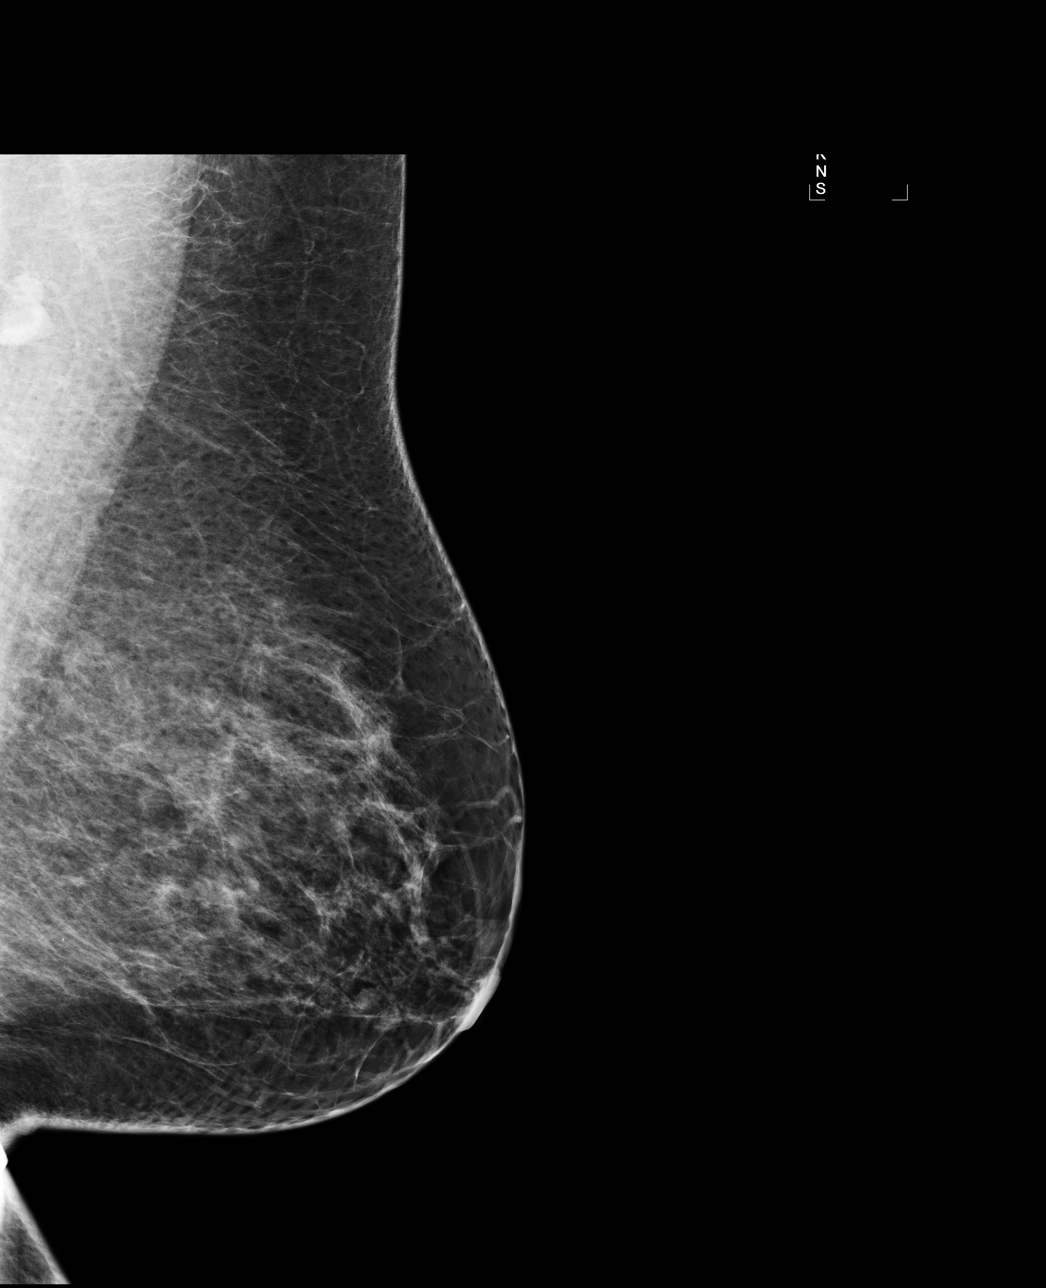

[R MLO]
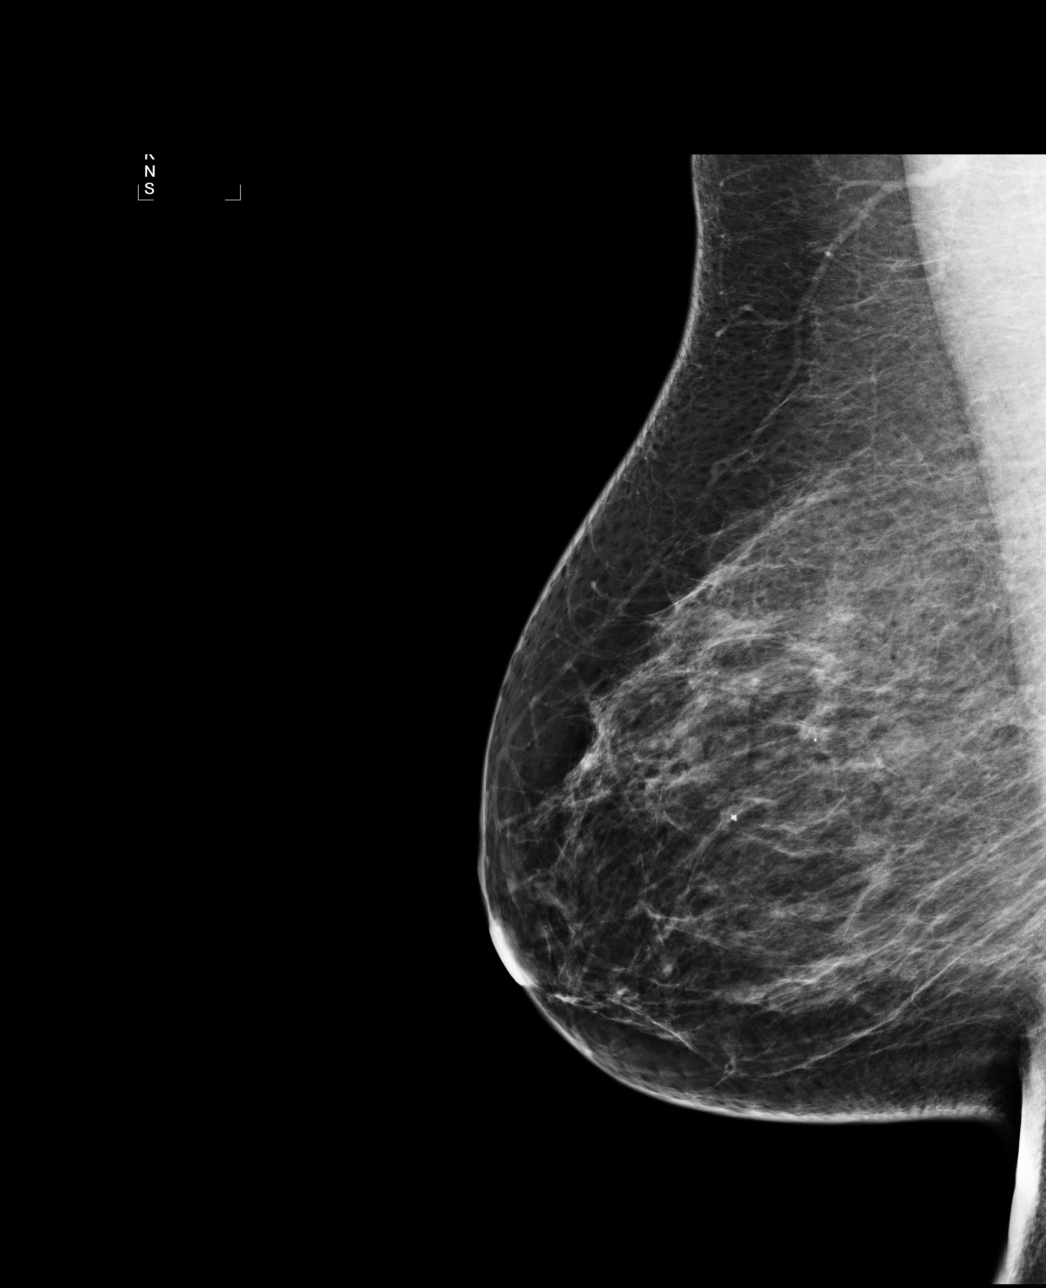

[4 of 4 positions shown; findings below may reference images not displayed]

IMPRESSION: No specific mammographic evidence of malignancy.  Next screening mammogram is recommended in one 
year.

A result letter of this screening mammogram will be mailed directly to the patient.

ASSESSMENT: Negative - BI-RADS 1

Screening mammogram in 1 year.
,

## 2011-02-24 DIAGNOSIS — E11359 Type 2 diabetes mellitus with proliferative diabetic retinopathy without macular edema: Secondary | ICD-10-CM | POA: Diagnosis not present

## 2011-02-24 DIAGNOSIS — H579 Unspecified disorder of eye and adnexa: Secondary | ICD-10-CM | POA: Diagnosis not present

## 2011-02-24 DIAGNOSIS — E119 Type 2 diabetes mellitus without complications: Secondary | ICD-10-CM | POA: Diagnosis not present

## 2011-02-24 DIAGNOSIS — E1139 Type 2 diabetes mellitus with other diabetic ophthalmic complication: Secondary | ICD-10-CM | POA: Diagnosis not present

## 2011-02-24 DIAGNOSIS — E113599 Type 2 diabetes mellitus with proliferative diabetic retinopathy without macular edema, unspecified eye: Secondary | ICD-10-CM | POA: Insufficient documentation

## 2011-03-14 ENCOUNTER — Other Ambulatory Visit (HOSPITAL_COMMUNITY): Payer: Self-pay | Admitting: Family Medicine

## 2011-03-14 DIAGNOSIS — Z1231 Encounter for screening mammogram for malignant neoplasm of breast: Secondary | ICD-10-CM

## 2011-04-12 DIAGNOSIS — E1139 Type 2 diabetes mellitus with other diabetic ophthalmic complication: Secondary | ICD-10-CM | POA: Diagnosis not present

## 2011-04-12 DIAGNOSIS — Z0000 Encounter for general adult medical examination without abnormal findings: Secondary | ICD-10-CM | POA: Diagnosis not present

## 2011-04-12 DIAGNOSIS — I10 Essential (primary) hypertension: Secondary | ICD-10-CM | POA: Diagnosis not present

## 2011-04-12 DIAGNOSIS — Z79899 Other long term (current) drug therapy: Secondary | ICD-10-CM | POA: Diagnosis not present

## 2011-04-12 DIAGNOSIS — E7800 Pure hypercholesterolemia, unspecified: Secondary | ICD-10-CM | POA: Diagnosis not present

## 2011-04-12 DIAGNOSIS — E669 Obesity, unspecified: Secondary | ICD-10-CM | POA: Diagnosis not present

## 2011-04-12 DIAGNOSIS — E11329 Type 2 diabetes mellitus with mild nonproliferative diabetic retinopathy without macular edema: Secondary | ICD-10-CM | POA: Diagnosis not present

## 2011-04-12 DIAGNOSIS — Z1211 Encounter for screening for malignant neoplasm of colon: Secondary | ICD-10-CM | POA: Diagnosis not present

## 2011-04-17 ENCOUNTER — Ambulatory Visit (HOSPITAL_COMMUNITY)
Admission: RE | Admit: 2011-04-17 | Discharge: 2011-04-17 | Disposition: A | Discharge status: Home / Self Care | Payer: Medicare Other | Source: Ambulatory Visit | Attending: Family Medicine | Admitting: Family Medicine

## 2011-04-17 DIAGNOSIS — Z1231 Encounter for screening mammogram for malignant neoplasm of breast: Secondary | ICD-10-CM | POA: Diagnosis not present

## 2011-04-17 IMAGING — MG MM DIGITAL SCREENING BILAT
5 series · 5 of 5 positions shown · non-contrast
Comparison: none

DG SCREEN MAMMOGRAM BILATERAL
Bilateral CC and MLO view(s) were taken.
Technologist: HAITAO.(HAITAO)(M)

DIGITAL SCREENING MAMMOGRAM WITH CAD:
There are scattered fibroglandular densities.  No new masses or malignant type calcifications are 
identified.  Compared with prior studies.
Images were processed with CAD.

[R CC]
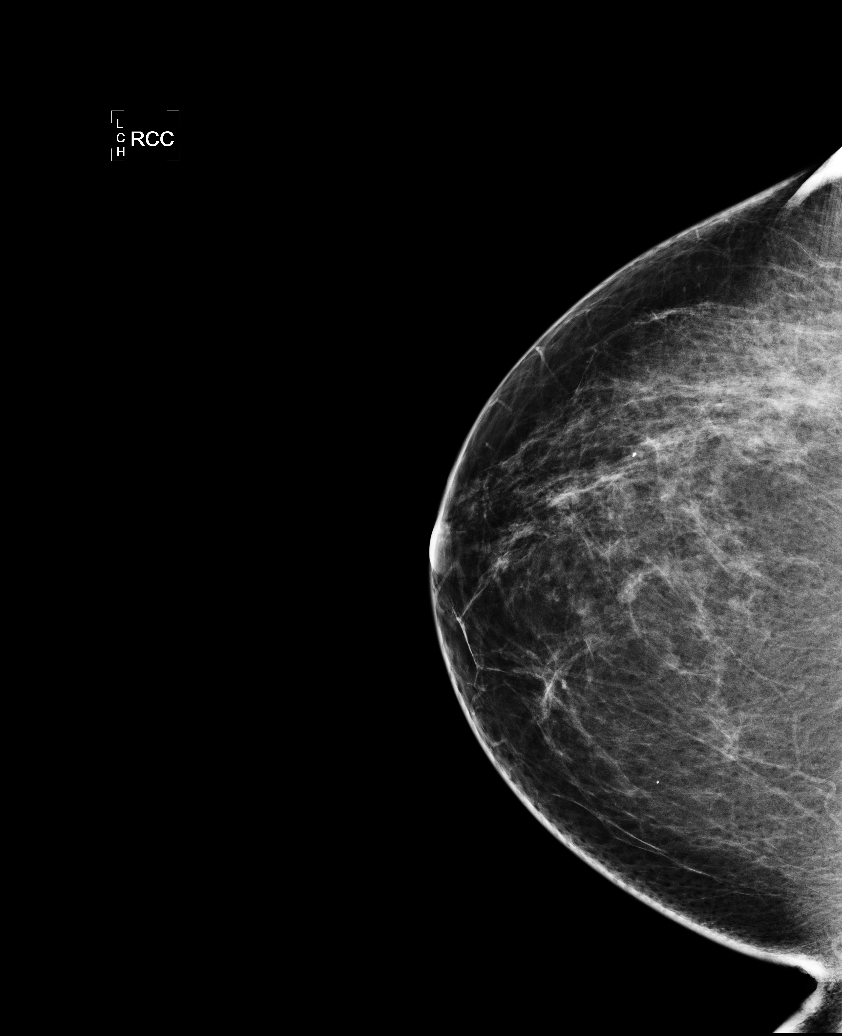

[R MLO]
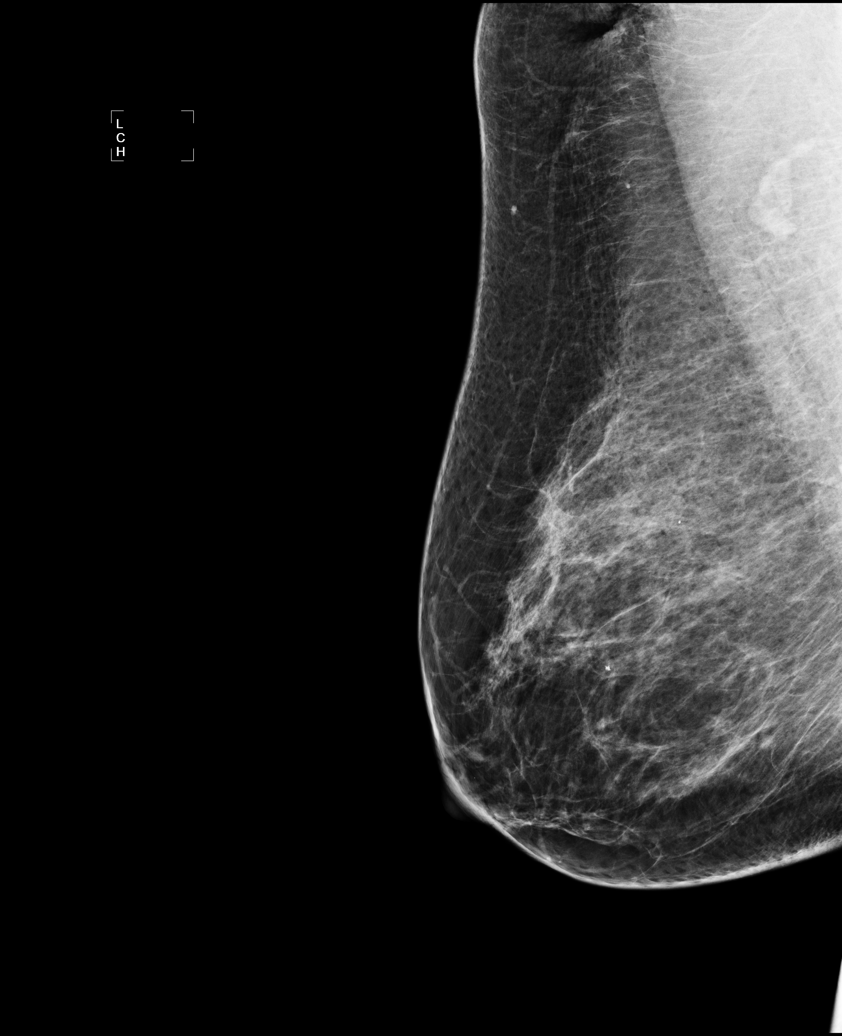

[L CC]
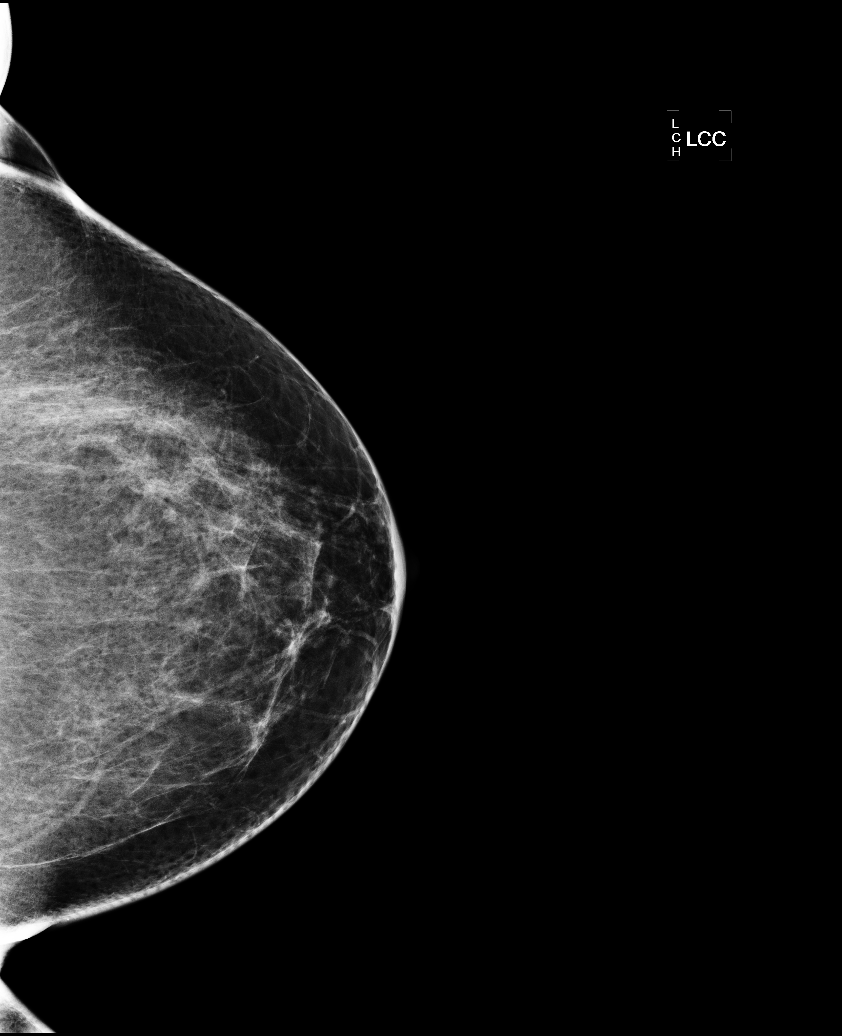

[L MLO (1 of 2)]
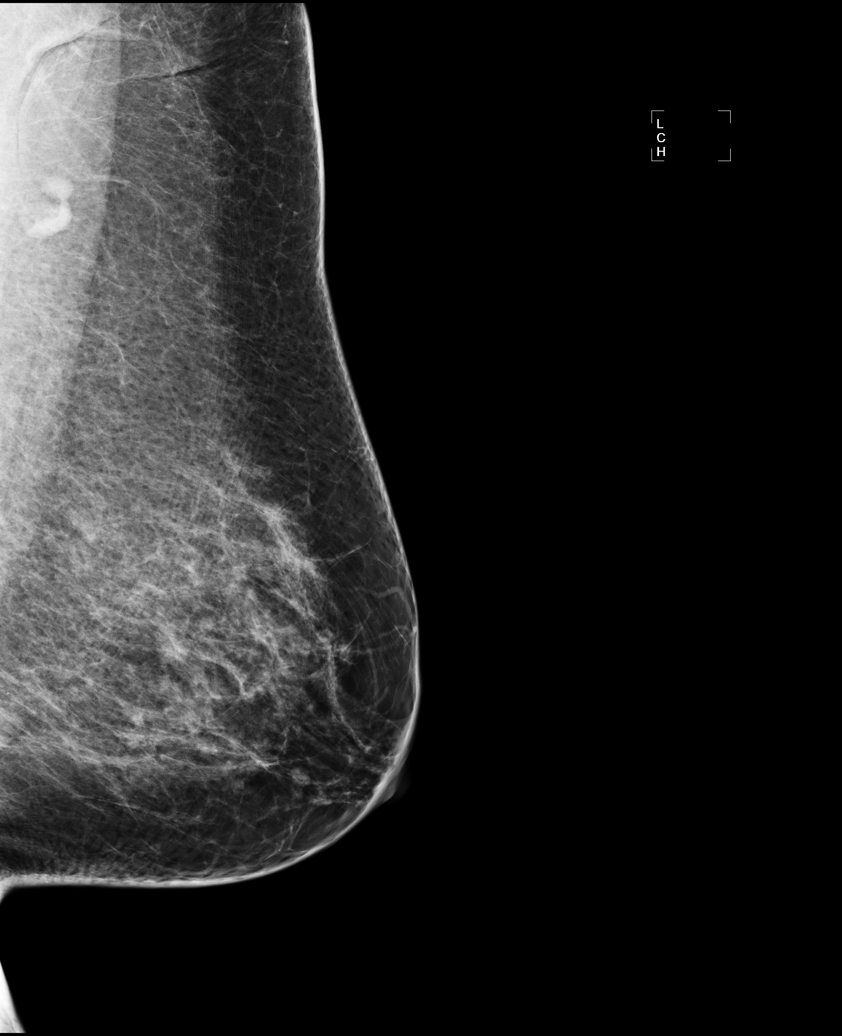

[L MLO (2 of 2)]
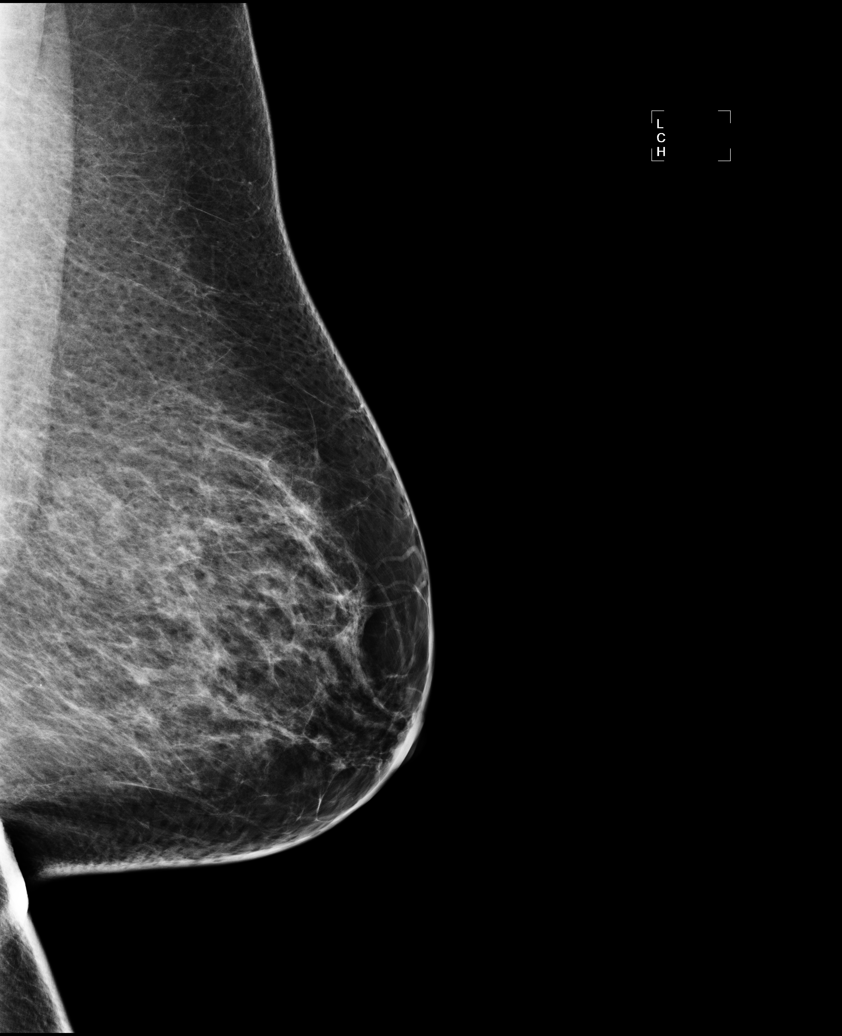

[5 of 5 positions shown; findings below may reference images not displayed]

IMPRESSION: No specific mammographic evidence of malignancy.  Next screening mammogram is recommended in one 
year.

A result letter of this screening mammogram will be mailed directly to the patient.

ASSESSMENT: Negative - BI-RADS 1

Screening mammogram in 1 year.
,

## 2011-05-12 DIAGNOSIS — M1710 Unilateral primary osteoarthritis, unspecified knee: Secondary | ICD-10-CM | POA: Diagnosis not present

## 2011-05-17 DIAGNOSIS — N951 Menopausal and female climacteric states: Secondary | ICD-10-CM | POA: Diagnosis not present

## 2011-06-28 DIAGNOSIS — E119 Type 2 diabetes mellitus without complications: Secondary | ICD-10-CM | POA: Diagnosis not present

## 2011-06-28 DIAGNOSIS — H2510 Age-related nuclear cataract, unspecified eye: Secondary | ICD-10-CM | POA: Diagnosis not present

## 2011-07-19 DIAGNOSIS — E1139 Type 2 diabetes mellitus with other diabetic ophthalmic complication: Secondary | ICD-10-CM | POA: Diagnosis not present

## 2011-07-19 DIAGNOSIS — E7800 Pure hypercholesterolemia, unspecified: Secondary | ICD-10-CM | POA: Diagnosis not present

## 2011-08-25 DIAGNOSIS — E119 Type 2 diabetes mellitus without complications: Secondary | ICD-10-CM | POA: Diagnosis not present

## 2011-08-25 DIAGNOSIS — E11359 Type 2 diabetes mellitus with proliferative diabetic retinopathy without macular edema: Secondary | ICD-10-CM | POA: Diagnosis not present

## 2011-08-25 DIAGNOSIS — E1139 Type 2 diabetes mellitus with other diabetic ophthalmic complication: Secondary | ICD-10-CM | POA: Diagnosis not present

## 2011-08-25 DIAGNOSIS — H579 Unspecified disorder of eye and adnexa: Secondary | ICD-10-CM | POA: Diagnosis not present

## 2011-10-11 DIAGNOSIS — E669 Obesity, unspecified: Secondary | ICD-10-CM | POA: Diagnosis not present

## 2011-10-11 DIAGNOSIS — Z23 Encounter for immunization: Secondary | ICD-10-CM | POA: Diagnosis not present

## 2011-10-11 DIAGNOSIS — I10 Essential (primary) hypertension: Secondary | ICD-10-CM | POA: Diagnosis not present

## 2011-10-11 DIAGNOSIS — E7800 Pure hypercholesterolemia, unspecified: Secondary | ICD-10-CM | POA: Diagnosis not present

## 2011-10-11 DIAGNOSIS — E1139 Type 2 diabetes mellitus with other diabetic ophthalmic complication: Secondary | ICD-10-CM | POA: Diagnosis not present

## 2011-10-11 DIAGNOSIS — E1165 Type 2 diabetes mellitus with hyperglycemia: Secondary | ICD-10-CM | POA: Diagnosis not present

## 2011-10-11 DIAGNOSIS — Z79899 Other long term (current) drug therapy: Secondary | ICD-10-CM | POA: Diagnosis not present

## 2011-10-11 DIAGNOSIS — E11329 Type 2 diabetes mellitus with mild nonproliferative diabetic retinopathy without macular edema: Secondary | ICD-10-CM | POA: Diagnosis not present

## 2011-11-02 DIAGNOSIS — Z23 Encounter for immunization: Secondary | ICD-10-CM | POA: Diagnosis not present

## 2012-01-26 DIAGNOSIS — E11359 Type 2 diabetes mellitus with proliferative diabetic retinopathy without macular edema: Secondary | ICD-10-CM | POA: Diagnosis not present

## 2012-01-26 DIAGNOSIS — E119 Type 2 diabetes mellitus without complications: Secondary | ICD-10-CM | POA: Diagnosis not present

## 2012-03-22 ENCOUNTER — Other Ambulatory Visit (HOSPITAL_COMMUNITY): Payer: Self-pay | Admitting: Family Medicine

## 2012-03-22 DIAGNOSIS — Z1231 Encounter for screening mammogram for malignant neoplasm of breast: Secondary | ICD-10-CM

## 2012-04-18 ENCOUNTER — Ambulatory Visit (HOSPITAL_COMMUNITY)
Admission: RE | Admit: 2012-04-18 | Discharge: 2012-04-18 | Disposition: A | Discharge status: Home / Self Care | Payer: Medicare Other | Source: Ambulatory Visit | Attending: Family Medicine | Admitting: Family Medicine

## 2012-04-18 DIAGNOSIS — Z1231 Encounter for screening mammogram for malignant neoplasm of breast: Secondary | ICD-10-CM | POA: Diagnosis not present

## 2012-04-18 IMAGING — MG MM DIGITAL SCREENING BILAT
4 series · 4 of 4 positions shown · non-contrast
Comparison: Previous exams.

CLINICAL DATA: Screening.

DIGITAL BILATERAL SCREENING MAMMOGRAM WITH CAD

[R CC]
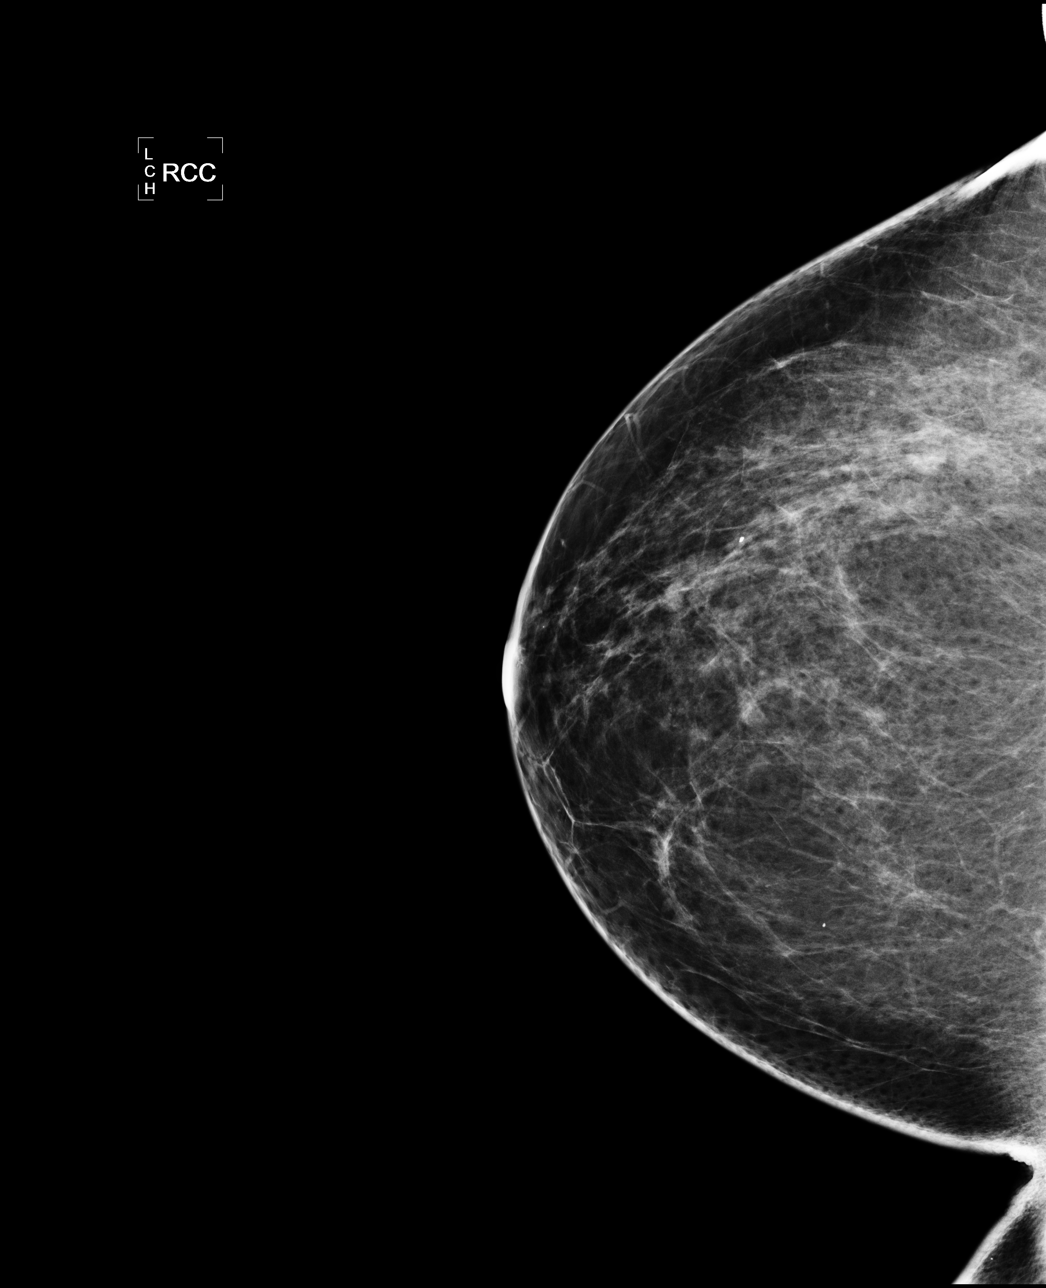

[R MLO]
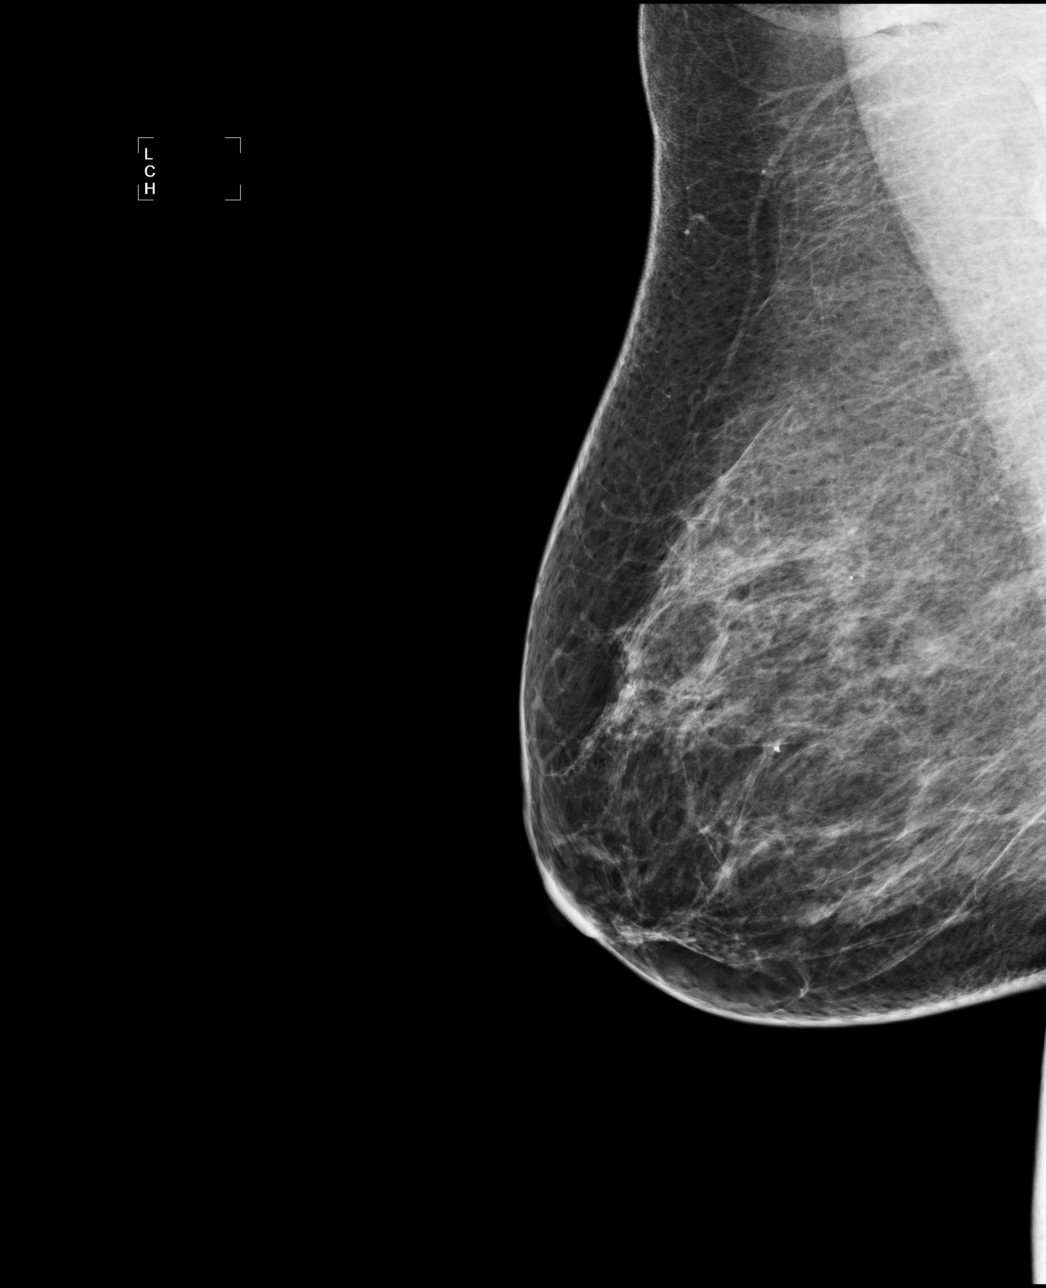

[L CC]
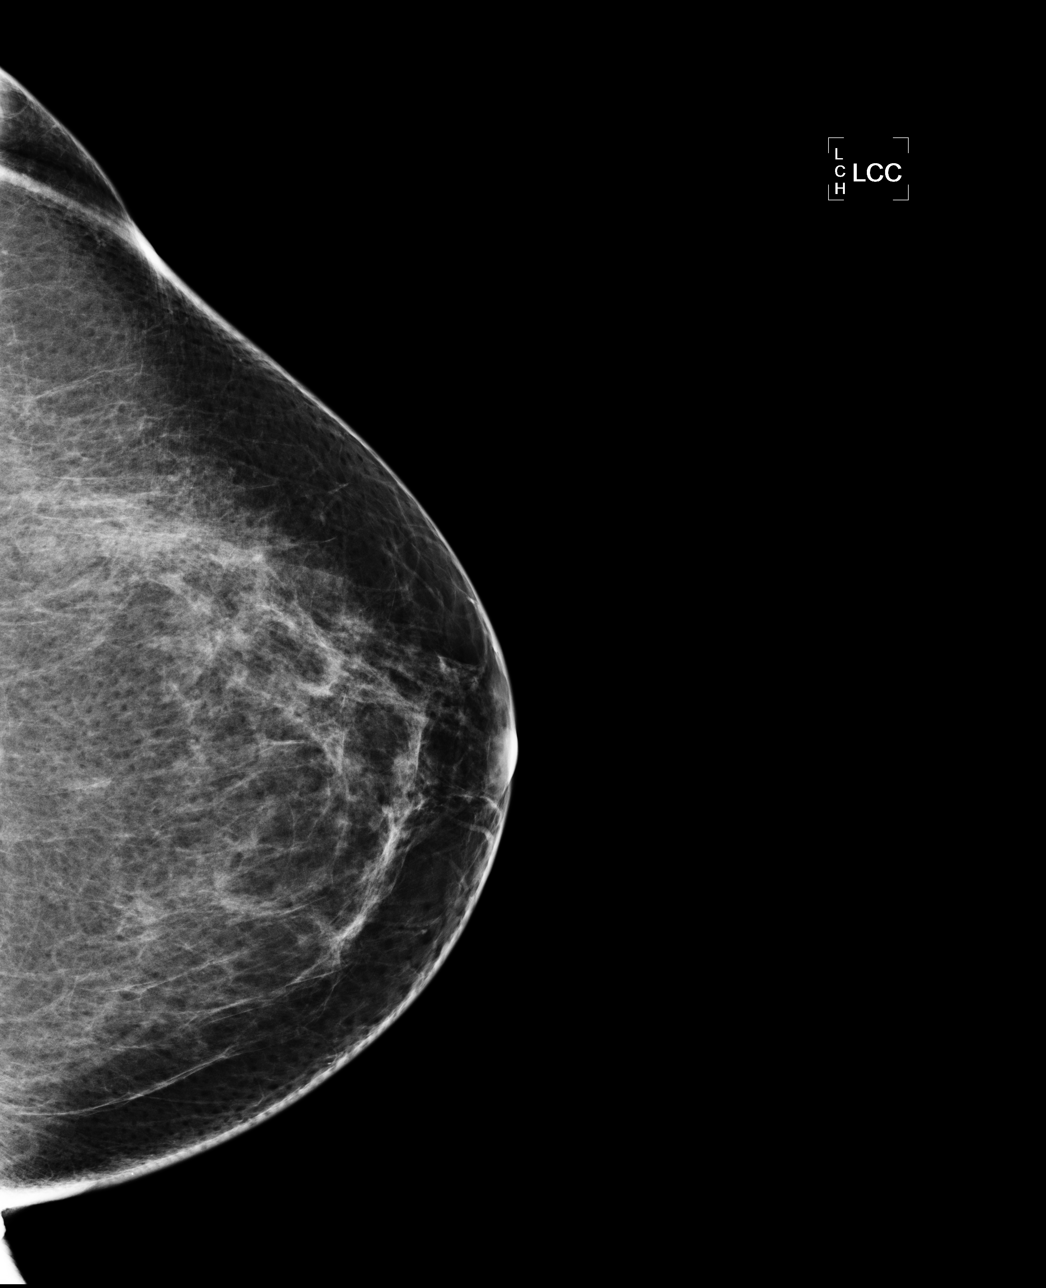

[L MLO]
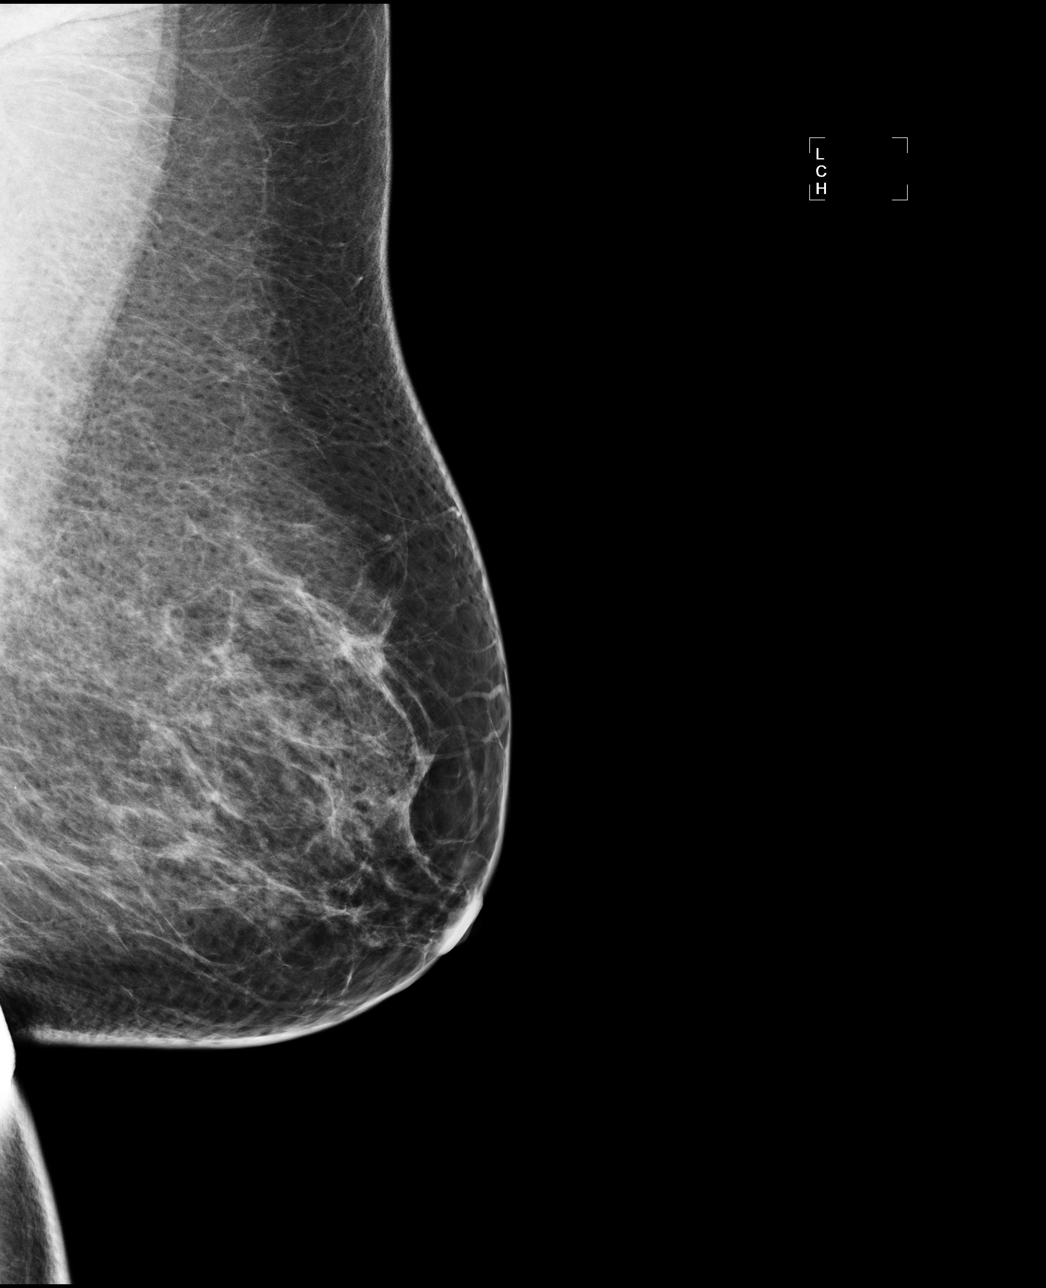

[4 of 4 positions shown; findings below may reference images not displayed]

FINDINGS: ACR Breast Density Category 2: There is a scattered fibroglandular
pattern.

No suspicious masses, architectural distortion, or calcifications
are present.

Images were processed with CAD.
IMPRESSION: No mammographic evidence of malignancy.

A result letter of this screening mammogram will be mailed directly
to the patient.

RECOMMENDATION:
Screening mammogram in one year. (Code:[FI])

BI-RADS CATEGORY 1:  Negative.

## 2012-04-24 DIAGNOSIS — E11329 Type 2 diabetes mellitus with mild nonproliferative diabetic retinopathy without macular edema: Secondary | ICD-10-CM | POA: Diagnosis not present

## 2012-04-24 DIAGNOSIS — E1139 Type 2 diabetes mellitus with other diabetic ophthalmic complication: Secondary | ICD-10-CM | POA: Diagnosis not present

## 2012-04-24 DIAGNOSIS — Z0000 Encounter for general adult medical examination without abnormal findings: Secondary | ICD-10-CM | POA: Diagnosis not present

## 2012-04-24 DIAGNOSIS — E1165 Type 2 diabetes mellitus with hyperglycemia: Secondary | ICD-10-CM | POA: Diagnosis not present

## 2012-04-24 DIAGNOSIS — I10 Essential (primary) hypertension: Secondary | ICD-10-CM | POA: Diagnosis not present

## 2012-04-24 DIAGNOSIS — N182 Chronic kidney disease, stage 2 (mild): Secondary | ICD-10-CM | POA: Diagnosis not present

## 2012-04-24 DIAGNOSIS — E1129 Type 2 diabetes mellitus with other diabetic kidney complication: Secondary | ICD-10-CM | POA: Diagnosis not present

## 2012-07-23 DIAGNOSIS — E119 Type 2 diabetes mellitus without complications: Secondary | ICD-10-CM | POA: Diagnosis not present

## 2012-07-23 DIAGNOSIS — H4310 Vitreous hemorrhage, unspecified eye: Secondary | ICD-10-CM | POA: Diagnosis not present

## 2012-07-23 DIAGNOSIS — E11359 Type 2 diabetes mellitus with proliferative diabetic retinopathy without macular edema: Secondary | ICD-10-CM | POA: Diagnosis not present

## 2012-07-24 DIAGNOSIS — E669 Obesity, unspecified: Secondary | ICD-10-CM | POA: Diagnosis not present

## 2012-07-24 DIAGNOSIS — E1139 Type 2 diabetes mellitus with other diabetic ophthalmic complication: Secondary | ICD-10-CM | POA: Diagnosis not present

## 2012-07-24 DIAGNOSIS — I10 Essential (primary) hypertension: Secondary | ICD-10-CM | POA: Diagnosis not present

## 2012-07-24 DIAGNOSIS — S81009A Unspecified open wound, unspecified knee, initial encounter: Secondary | ICD-10-CM | POA: Diagnosis not present

## 2012-07-24 DIAGNOSIS — S81809A Unspecified open wound, unspecified lower leg, initial encounter: Secondary | ICD-10-CM | POA: Diagnosis not present

## 2012-10-25 ENCOUNTER — Encounter: Payer: Self-pay | Admitting: Internal Medicine

## 2012-10-25 DIAGNOSIS — I10 Essential (primary) hypertension: Secondary | ICD-10-CM | POA: Diagnosis not present

## 2012-10-25 DIAGNOSIS — D649 Anemia, unspecified: Secondary | ICD-10-CM | POA: Diagnosis not present

## 2012-10-25 DIAGNOSIS — E7800 Pure hypercholesterolemia, unspecified: Secondary | ICD-10-CM | POA: Diagnosis not present

## 2012-10-25 DIAGNOSIS — E669 Obesity, unspecified: Secondary | ICD-10-CM | POA: Diagnosis not present

## 2012-10-25 DIAGNOSIS — E1139 Type 2 diabetes mellitus with other diabetic ophthalmic complication: Secondary | ICD-10-CM | POA: Diagnosis not present

## 2012-10-25 DIAGNOSIS — D539 Nutritional anemia, unspecified: Secondary | ICD-10-CM | POA: Diagnosis not present

## 2012-11-21 ENCOUNTER — Ambulatory Visit (AMBULATORY_SURGERY_CENTER): Payer: Self-pay | Admitting: *Deleted

## 2012-11-21 VITALS — Ht 70.0 in | Wt 243.0 lb

## 2012-11-21 DIAGNOSIS — Z1211 Encounter for screening for malignant neoplasm of colon: Secondary | ICD-10-CM

## 2012-11-21 MED ORDER — NA SULFATE-K SULFATE-MG SULF 17.5-3.13-1.6 GM/177ML PO SOLN: 1.0000 | Freq: Once | ORAL | Status: DC

## 2012-11-21 NOTE — Progress Notes (Signed)
No allergies to eggs or soy. No problems with anesthesia.  

## 2012-12-12 ENCOUNTER — Encounter: Payer: Self-pay | Admitting: Internal Medicine

## 2012-12-12 ENCOUNTER — Ambulatory Visit (AMBULATORY_SURGERY_CENTER): Payer: Medicare Other | Admitting: Internal Medicine

## 2012-12-12 VITALS — BP 122/63 | HR 46 | Temp 96.0°F | Resp 16 | Ht 70.0 in | Wt 243.0 lb

## 2012-12-12 DIAGNOSIS — K5730 Diverticulosis of large intestine without perforation or abscess without bleeding: Secondary | ICD-10-CM | POA: Diagnosis not present

## 2012-12-12 DIAGNOSIS — E669 Obesity, unspecified: Secondary | ICD-10-CM | POA: Diagnosis not present

## 2012-12-12 DIAGNOSIS — E119 Type 2 diabetes mellitus without complications: Secondary | ICD-10-CM | POA: Diagnosis not present

## 2012-12-12 DIAGNOSIS — D126 Benign neoplasm of colon, unspecified: Secondary | ICD-10-CM

## 2012-12-12 DIAGNOSIS — Z1211 Encounter for screening for malignant neoplasm of colon: Secondary | ICD-10-CM

## 2012-12-12 DIAGNOSIS — I10 Essential (primary) hypertension: Secondary | ICD-10-CM | POA: Diagnosis not present

## 2012-12-12 MED ORDER — SODIUM CHLORIDE 0.9 % IV SOLN: 500.0000 mL | INTRAVENOUS | Status: DC

## 2012-12-12 NOTE — Progress Notes (Signed)
Report to pacu rn, vss, bbs=clear 

## 2012-12-12 NOTE — Patient Instructions (Addendum)
There was one tiny polyp removed. You also have diverticulosis. I suspect the polyp is benign and you could still go up to 10 years for next colonoscopy but will let you know.  I will let you know pathology results and when to have another routine colonoscopy by mail.   It is the time of year to have a vaccination to prevent the flu (influenza virus). Please have this done through your primary care provider or you can get this done at local pharmacies or the [facility]. It would be very helpful if you notify your primary care provider when and where you had the vaccination given by messaging them in My Chart, leaving a message or faxing the information.  I appreciate the opportunity to care for you. Iva Boop, MD, FACG    YOU HAD AN ENDOSCOPIC PROCEDURE TODAY AT THE [facility]: Refer to the procedure report that was given to you for any specific questions about what was found during the examination.  If the procedure report does not answer your questions, please call your gastroenterologist to clarify.  If you requested that your care partner not be given the details of your procedure findings, then the procedure report has been included in a sealed envelope for you to review at your convenience later.  YOU SHOULD EXPECT: Some feelings of bloating in the abdomen. Passage of more gas than usual.  Walking can help get rid of the air that was put into your GI tract during the procedure and reduce the bloating. If you had a lower endoscopy (such as a colonoscopy or flexible sigmoidoscopy) you may notice spotting of blood in your stool or on the toilet paper. If you underwent a bowel prep for your procedure, then you may not have a normal bowel movement for a few days.  DIET: Your first meal following the procedure should be a light meal and then it is ok to progress to your normal diet.  A half-sandwich or bowl of soup is an example of a good first meal.  Heavy or fried foods are  harder to digest and may make you feel nauseous or bloated.  Likewise meals heavy in dairy and vegetables can cause extra gas to form and this can also increase the bloating.  Drink plenty of fluids but you should avoid alcoholic beverages for 24 hours.  ACTIVITY: Your care partner should take you home directly after the procedure.  You should plan to take it easy, moving slowly for the rest of the day.  You can resume normal activity the day after the procedure however you should NOT DRIVE or use heavy machinery for 24 hours (because of the sedation medicines used during the test).    SYMPTOMS TO REPORT IMMEDIATELY: A gastroenterologist can be reached at any hour.  During normal business hours, 8:30 AM to 5:00 PM Monday through Friday, call [phone].  After hours and on weekends, please call the GI answering service at [phone] who will take a message and have the physician on call contact you.   Following lower endoscopy (colonoscopy or flexible sigmoidoscopy):  Excessive amounts of blood in the stool  Significant tenderness or worsening of abdominal pains  Swelling of the abdomen that is new, acute  Fever of 100F or higher  FOLLOW UP: If any biopsies were taken you will be contacted by phone or by letter within the next 1-3 weeks.  Call your gastroenterologist if you have not heard about the biopsies in  3 weeks.  Our staff will call the home number listed on your records the next business day following your procedure to check on you and address any questions or concerns that you may have at that time regarding the information given to you following your procedure. This is a courtesy call and so if there is no answer at the home number and we have not heard from you through the emergency physician on call, we will assume that you have returned to your regular daily activities without incident.  SIGNATURES/CONFIDENTIALITY: You and/or your care partner have signed paperwork which will  be entered into your electronic medical record.  These signatures attest to the fact that that the information above on your After Visit Summary has been reviewed and is understood.  Full responsibility of the confidentiality of this discharge information lies with you and/or your care-partner.

## 2012-12-12 NOTE — Progress Notes (Signed)
Called to room to assist during endoscopic procedure.  Patient ID and intended procedure confirmed with present staff. Received instructions for my participation in the procedure from the performing physician. ewm 

## 2012-12-12 NOTE — Op Note (Signed)
[facility] [address] [place], [address]   COLONOSCOPY PROCEDURE REPORT  PATIENT: Caroline Blair, Caroline Blair  MR#: [id number] BIRTHDATE: 09-09-1945 , 66  yrs. old GENDER: Female ENDOSCOPIST: Iva Boop, MD, [facility] PROCEDURE DATE:  12/12/2012 PROCEDURE:   Colonoscopy with biopsy First Screening Colonoscopy - Avg.  risk and is 50 yrs.  old or older - No.  Prior Negative Screening - Now for repeat screening. 10 or more years since last screening  History of Adenoma - Now for follow-up colonoscopy & has been > or = to 3 yrs.  N/A  Polyps Removed Today? Yes. ASA CLASS:   Class III INDICATIONS:average risk screening and Last colonoscopy performed 10 years ago. MEDICATIONS: Propofol (Diprivan) 330 mg IV, MAC sedation, administered by CRNA, and These medications were titrated to patient response per physician's verbal order  DESCRIPTION OF PROCEDURE:   After the risks benefits and alternatives of the procedure were thoroughly explained, informed consent was obtained.  A digital rectal exam revealed no abnormalities of the rectum.   The LB [id number]  endoscope was introduced through the anus and advanced to the cecum, which was identified by both the appendix and ileocecal valve. No adverse events experienced.   The quality of the prep was excellent using Suprep  The instrument was then slowly withdrawn as the colon was fully examined.      COLON FINDINGS: A sessile polyp measuring 2 mm in size was found at the cecum.  A biopsy was performed using cold forceps.   Moderate diverticulosis was noted in the sigmoid colon.   The colon mucosa was otherwise normal.   A right colon retroflexion was performed. Retroflexed views revealed no abnormalities. The time to cecum=5 minutes 22 seconds.  Withdrawal time=11 minutes 54 seconds.  The scope was withdrawn and the procedure completed. COMPLICATIONS: There were no complications.  ENDOSCOPIC IMPRESSION: 1.   Sessile  polyp measuring 2 mm in size was found at the cecum; biopsy was performed using cold forceps 2.   Moderate diverticulosis was noted in the sigmoid colon 3.   The colon mucosa was otherwise normal - excellent prep - second screening  RECOMMENDATIONS: Timing of repeat colonoscopy will be determined by pathology findings.   eSigned:  Iva Boop, MD, [facility] 12/12/2012 8:40 AM   cc: The Patient  and Shirlean Mylar, MD

## 2012-12-12 NOTE — Progress Notes (Signed)
Patient did not experience any of the following events: a burn prior to discharge; a fall within the facility; wrong site/side/patient/procedure/implant event; or a hospital transfer or hospital admission upon discharge from the facility. (G8907) Patient did not have preoperative order for IV antibiotic SSI prophylaxis. (G8918)  

## 2012-12-13 ENCOUNTER — Telehealth: Payer: Self-pay | Admitting: *Deleted

## 2012-12-13 NOTE — Telephone Encounter (Signed)
  Follow up Call-  Call back number 12/12/2012  Post procedure Call Back phone  # [phone]  Permission to leave phone message Yes     Patient questions:  Do you have a fever, pain , or abdominal swelling? no Pain Score  0 *  Have you tolerated food without any problems? yes  Have you been able to return to your normal activities? yes  Do you have any questions about your discharge instructions: Diet   no Medications  no Follow up visit  no  Do you have questions or concerns about your Care? no  Actions: * If pain score is 4 or above: No action needed, pain <4.

## 2012-12-21 ENCOUNTER — Encounter: Payer: Self-pay | Admitting: Internal Medicine

## 2012-12-21 NOTE — Progress Notes (Signed)
Quick Note:  Benign mucosal polyp Repeat colonoscopy 2024 ______

## 2012-12-23 DIAGNOSIS — Z23 Encounter for immunization: Secondary | ICD-10-CM | POA: Diagnosis not present

## 2012-12-25 DIAGNOSIS — D649 Anemia, unspecified: Secondary | ICD-10-CM | POA: Diagnosis not present

## 2013-01-22 ENCOUNTER — Other Ambulatory Visit: Payer: Self-pay | Admitting: Obstetrics & Gynecology

## 2013-01-22 DIAGNOSIS — N950 Postmenopausal bleeding: Secondary | ICD-10-CM | POA: Diagnosis not present

## 2013-01-22 DIAGNOSIS — C549 Malignant neoplasm of corpus uteri, unspecified: Secondary | ICD-10-CM | POA: Diagnosis not present

## 2013-01-28 DIAGNOSIS — C549 Malignant neoplasm of corpus uteri, unspecified: Secondary | ICD-10-CM | POA: Diagnosis not present

## 2013-02-11 DIAGNOSIS — E11359 Type 2 diabetes mellitus with proliferative diabetic retinopathy without macular edema: Secondary | ICD-10-CM | POA: Diagnosis not present

## 2013-02-11 DIAGNOSIS — E119 Type 2 diabetes mellitus without complications: Secondary | ICD-10-CM | POA: Diagnosis not present

## 2013-02-11 DIAGNOSIS — H4310 Vitreous hemorrhage, unspecified eye: Secondary | ICD-10-CM | POA: Diagnosis not present

## 2013-02-12 DIAGNOSIS — C549 Malignant neoplasm of corpus uteri, unspecified: Secondary | ICD-10-CM | POA: Diagnosis not present

## 2013-02-17 DIAGNOSIS — E785 Hyperlipidemia, unspecified: Secondary | ICD-10-CM | POA: Diagnosis not present

## 2013-02-17 DIAGNOSIS — Z86010 Personal history of colonic polyps: Secondary | ICD-10-CM | POA: Diagnosis not present

## 2013-02-17 DIAGNOSIS — I10 Essential (primary) hypertension: Secondary | ICD-10-CM | POA: Diagnosis not present

## 2013-02-17 DIAGNOSIS — Z7982 Long term (current) use of aspirin: Secondary | ICD-10-CM | POA: Diagnosis not present

## 2013-02-17 DIAGNOSIS — C549 Malignant neoplasm of corpus uteri, unspecified: Secondary | ICD-10-CM | POA: Diagnosis not present

## 2013-02-17 DIAGNOSIS — R011 Cardiac murmur, unspecified: Secondary | ICD-10-CM | POA: Diagnosis not present

## 2013-02-17 DIAGNOSIS — Z79899 Other long term (current) drug therapy: Secondary | ICD-10-CM | POA: Diagnosis not present

## 2013-02-17 DIAGNOSIS — E119 Type 2 diabetes mellitus without complications: Secondary | ICD-10-CM | POA: Diagnosis not present

## 2013-02-21 DIAGNOSIS — C541 Malignant neoplasm of endometrium: Secondary | ICD-10-CM | POA: Insufficient documentation

## 2013-03-06 DIAGNOSIS — C549 Malignant neoplasm of corpus uteri, unspecified: Secondary | ICD-10-CM | POA: Diagnosis not present

## 2013-03-06 DIAGNOSIS — Z86010 Personal history of colonic polyps: Secondary | ICD-10-CM | POA: Diagnosis not present

## 2013-03-06 DIAGNOSIS — E119 Type 2 diabetes mellitus without complications: Secondary | ICD-10-CM | POA: Diagnosis not present

## 2013-03-06 DIAGNOSIS — Z7982 Long term (current) use of aspirin: Secondary | ICD-10-CM | POA: Diagnosis not present

## 2013-03-06 DIAGNOSIS — E785 Hyperlipidemia, unspecified: Secondary | ICD-10-CM | POA: Diagnosis not present

## 2013-03-06 DIAGNOSIS — N950 Postmenopausal bleeding: Secondary | ICD-10-CM | POA: Diagnosis not present

## 2013-03-06 DIAGNOSIS — Z01818 Encounter for other preprocedural examination: Secondary | ICD-10-CM | POA: Diagnosis not present

## 2013-03-06 DIAGNOSIS — Z79899 Other long term (current) drug therapy: Secondary | ICD-10-CM | POA: Diagnosis not present

## 2013-03-06 DIAGNOSIS — I10 Essential (primary) hypertension: Secondary | ICD-10-CM | POA: Diagnosis not present

## 2013-03-06 DIAGNOSIS — M129 Arthropathy, unspecified: Secondary | ICD-10-CM | POA: Diagnosis not present

## 2013-03-13 DIAGNOSIS — E785 Hyperlipidemia, unspecified: Secondary | ICD-10-CM | POA: Diagnosis not present

## 2013-03-13 DIAGNOSIS — E119 Type 2 diabetes mellitus without complications: Secondary | ICD-10-CM | POA: Diagnosis not present

## 2013-03-13 DIAGNOSIS — C549 Malignant neoplasm of corpus uteri, unspecified: Secondary | ICD-10-CM | POA: Diagnosis not present

## 2013-03-13 DIAGNOSIS — I10 Essential (primary) hypertension: Secondary | ICD-10-CM | POA: Diagnosis not present

## 2013-03-13 DIAGNOSIS — M129 Arthropathy, unspecified: Secondary | ICD-10-CM | POA: Diagnosis not present

## 2013-03-13 DIAGNOSIS — D251 Intramural leiomyoma of uterus: Secondary | ICD-10-CM | POA: Diagnosis not present

## 2013-03-13 DIAGNOSIS — Z86010 Personal history of colonic polyps: Secondary | ICD-10-CM | POA: Diagnosis not present

## 2013-03-14 DIAGNOSIS — M129 Arthropathy, unspecified: Secondary | ICD-10-CM | POA: Diagnosis not present

## 2013-03-14 DIAGNOSIS — C549 Malignant neoplasm of corpus uteri, unspecified: Secondary | ICD-10-CM | POA: Diagnosis not present

## 2013-03-14 DIAGNOSIS — E785 Hyperlipidemia, unspecified: Secondary | ICD-10-CM | POA: Diagnosis not present

## 2013-03-14 DIAGNOSIS — I10 Essential (primary) hypertension: Secondary | ICD-10-CM | POA: Diagnosis not present

## 2013-03-14 DIAGNOSIS — Z86010 Personal history of colonic polyps: Secondary | ICD-10-CM | POA: Diagnosis not present

## 2013-03-14 DIAGNOSIS — E119 Type 2 diabetes mellitus without complications: Secondary | ICD-10-CM | POA: Diagnosis not present

## 2013-03-28 ENCOUNTER — Other Ambulatory Visit (HOSPITAL_COMMUNITY): Payer: Self-pay | Admitting: Family Medicine

## 2013-03-28 DIAGNOSIS — Z1231 Encounter for screening mammogram for malignant neoplasm of breast: Secondary | ICD-10-CM

## 2013-05-01 DIAGNOSIS — E7800 Pure hypercholesterolemia, unspecified: Secondary | ICD-10-CM | POA: Diagnosis not present

## 2013-05-01 DIAGNOSIS — Z0000 Encounter for general adult medical examination without abnormal findings: Secondary | ICD-10-CM | POA: Diagnosis not present

## 2013-05-01 DIAGNOSIS — D509 Iron deficiency anemia, unspecified: Secondary | ICD-10-CM | POA: Diagnosis not present

## 2013-05-01 DIAGNOSIS — E1139 Type 2 diabetes mellitus with other diabetic ophthalmic complication: Secondary | ICD-10-CM | POA: Diagnosis not present

## 2013-05-01 DIAGNOSIS — E538 Deficiency of other specified B group vitamins: Secondary | ICD-10-CM | POA: Diagnosis not present

## 2013-05-01 DIAGNOSIS — I10 Essential (primary) hypertension: Secondary | ICD-10-CM | POA: Diagnosis not present

## 2013-05-01 DIAGNOSIS — Z23 Encounter for immunization: Secondary | ICD-10-CM | POA: Diagnosis not present

## 2013-05-05 ENCOUNTER — Ambulatory Visit (HOSPITAL_COMMUNITY)
Admission: RE | Admit: 2013-05-05 | Discharge: 2013-05-05 | Disposition: A | Discharge status: Home / Self Care | Payer: Medicare Other | Source: Ambulatory Visit | Attending: Family Medicine | Admitting: Family Medicine

## 2013-05-05 DIAGNOSIS — Z1231 Encounter for screening mammogram for malignant neoplasm of breast: Secondary | ICD-10-CM | POA: Diagnosis not present

## 2013-05-22 DIAGNOSIS — C549 Malignant neoplasm of corpus uteri, unspecified: Secondary | ICD-10-CM | POA: Diagnosis not present

## 2013-05-22 DIAGNOSIS — M79609 Pain in unspecified limb: Secondary | ICD-10-CM | POA: Diagnosis not present

## 2013-05-22 DIAGNOSIS — M7989 Other specified soft tissue disorders: Secondary | ICD-10-CM | POA: Diagnosis not present

## 2013-05-30 DIAGNOSIS — K219 Gastro-esophageal reflux disease without esophagitis: Secondary | ICD-10-CM | POA: Diagnosis not present

## 2013-05-30 DIAGNOSIS — Z9079 Acquired absence of other genital organ(s): Secondary | ICD-10-CM | POA: Diagnosis not present

## 2013-05-30 DIAGNOSIS — E119 Type 2 diabetes mellitus without complications: Secondary | ICD-10-CM | POA: Diagnosis not present

## 2013-05-30 DIAGNOSIS — Z791 Long term (current) use of non-steroidal anti-inflammatories (NSAID): Secondary | ICD-10-CM | POA: Diagnosis not present

## 2013-05-30 DIAGNOSIS — C7982 Secondary malignant neoplasm of genital organs: Secondary | ICD-10-CM | POA: Diagnosis not present

## 2013-05-30 DIAGNOSIS — Z7982 Long term (current) use of aspirin: Secondary | ICD-10-CM | POA: Diagnosis not present

## 2013-05-30 DIAGNOSIS — C549 Malignant neoplasm of corpus uteri, unspecified: Secondary | ICD-10-CM | POA: Diagnosis not present

## 2013-05-30 DIAGNOSIS — M7989 Other specified soft tissue disorders: Secondary | ICD-10-CM | POA: Diagnosis not present

## 2013-05-30 DIAGNOSIS — M129 Arthropathy, unspecified: Secondary | ICD-10-CM | POA: Diagnosis not present

## 2013-05-30 DIAGNOSIS — R011 Cardiac murmur, unspecified: Secondary | ICD-10-CM | POA: Diagnosis not present

## 2013-05-30 DIAGNOSIS — I10 Essential (primary) hypertension: Secondary | ICD-10-CM | POA: Diagnosis not present

## 2013-05-30 DIAGNOSIS — E785 Hyperlipidemia, unspecified: Secondary | ICD-10-CM | POA: Diagnosis not present

## 2013-05-30 DIAGNOSIS — C55 Malignant neoplasm of uterus, part unspecified: Secondary | ICD-10-CM | POA: Diagnosis not present

## 2013-05-30 DIAGNOSIS — Z86010 Personal history of colonic polyps: Secondary | ICD-10-CM | POA: Diagnosis not present

## 2013-05-30 DIAGNOSIS — Z90710 Acquired absence of both cervix and uterus: Secondary | ICD-10-CM | POA: Diagnosis not present

## 2013-06-02 DIAGNOSIS — C549 Malignant neoplasm of corpus uteri, unspecified: Secondary | ICD-10-CM | POA: Diagnosis not present

## 2013-06-02 DIAGNOSIS — Z90710 Acquired absence of both cervix and uterus: Secondary | ICD-10-CM | POA: Diagnosis not present

## 2013-06-02 DIAGNOSIS — IMO0001 Reserved for inherently not codable concepts without codable children: Secondary | ICD-10-CM | POA: Diagnosis not present

## 2013-06-02 DIAGNOSIS — I890 Lymphedema, not elsewhere classified: Secondary | ICD-10-CM | POA: Diagnosis not present

## 2013-06-16 DIAGNOSIS — C549 Malignant neoplasm of corpus uteri, unspecified: Secondary | ICD-10-CM | POA: Diagnosis not present

## 2013-06-17 DIAGNOSIS — Z90710 Acquired absence of both cervix and uterus: Secondary | ICD-10-CM | POA: Diagnosis not present

## 2013-06-17 DIAGNOSIS — C7982 Secondary malignant neoplasm of genital organs: Secondary | ICD-10-CM | POA: Diagnosis not present

## 2013-06-17 DIAGNOSIS — Z510 Encounter for antineoplastic radiation therapy: Secondary | ICD-10-CM | POA: Diagnosis not present

## 2013-06-17 DIAGNOSIS — Z8542 Personal history of malignant neoplasm of other parts of uterus: Secondary | ICD-10-CM | POA: Diagnosis not present

## 2013-06-17 DIAGNOSIS — C549 Malignant neoplasm of corpus uteri, unspecified: Secondary | ICD-10-CM | POA: Diagnosis not present

## 2013-06-20 DIAGNOSIS — C549 Malignant neoplasm of corpus uteri, unspecified: Secondary | ICD-10-CM | POA: Diagnosis not present

## 2013-06-20 DIAGNOSIS — Z90710 Acquired absence of both cervix and uterus: Secondary | ICD-10-CM | POA: Diagnosis not present

## 2013-06-20 DIAGNOSIS — Z8542 Personal history of malignant neoplasm of other parts of uterus: Secondary | ICD-10-CM | POA: Diagnosis not present

## 2013-06-20 DIAGNOSIS — Z510 Encounter for antineoplastic radiation therapy: Secondary | ICD-10-CM | POA: Diagnosis not present

## 2013-06-20 DIAGNOSIS — C7982 Secondary malignant neoplasm of genital organs: Secondary | ICD-10-CM | POA: Diagnosis not present

## 2013-06-23 DIAGNOSIS — Z90710 Acquired absence of both cervix and uterus: Secondary | ICD-10-CM | POA: Diagnosis not present

## 2013-06-23 DIAGNOSIS — C7982 Secondary malignant neoplasm of genital organs: Secondary | ICD-10-CM | POA: Diagnosis not present

## 2013-06-23 DIAGNOSIS — C549 Malignant neoplasm of corpus uteri, unspecified: Secondary | ICD-10-CM | POA: Diagnosis not present

## 2013-06-23 DIAGNOSIS — Z510 Encounter for antineoplastic radiation therapy: Secondary | ICD-10-CM | POA: Diagnosis not present

## 2013-06-23 DIAGNOSIS — Z8542 Personal history of malignant neoplasm of other parts of uterus: Secondary | ICD-10-CM | POA: Diagnosis not present

## 2013-06-25 DIAGNOSIS — IMO0001 Reserved for inherently not codable concepts without codable children: Secondary | ICD-10-CM | POA: Diagnosis not present

## 2013-06-25 DIAGNOSIS — I890 Lymphedema, not elsewhere classified: Secondary | ICD-10-CM | POA: Diagnosis not present

## 2013-06-26 DIAGNOSIS — Z90710 Acquired absence of both cervix and uterus: Secondary | ICD-10-CM | POA: Diagnosis not present

## 2013-06-26 DIAGNOSIS — Z510 Encounter for antineoplastic radiation therapy: Secondary | ICD-10-CM | POA: Diagnosis not present

## 2013-06-26 DIAGNOSIS — Z8542 Personal history of malignant neoplasm of other parts of uterus: Secondary | ICD-10-CM | POA: Diagnosis not present

## 2013-06-26 DIAGNOSIS — C7982 Secondary malignant neoplasm of genital organs: Secondary | ICD-10-CM | POA: Diagnosis not present

## 2013-06-26 DIAGNOSIS — C549 Malignant neoplasm of corpus uteri, unspecified: Secondary | ICD-10-CM | POA: Diagnosis not present

## 2013-06-29 ENCOUNTER — Encounter: Payer: Self-pay | Admitting: *Deleted

## 2013-06-29 DIAGNOSIS — E119 Type 2 diabetes mellitus without complications: Secondary | ICD-10-CM

## 2013-06-29 DIAGNOSIS — R011 Cardiac murmur, unspecified: Secondary | ICD-10-CM | POA: Insufficient documentation

## 2013-06-29 DIAGNOSIS — I10 Essential (primary) hypertension: Secondary | ICD-10-CM | POA: Insufficient documentation

## 2013-06-29 DIAGNOSIS — E1122 Type 2 diabetes mellitus with diabetic chronic kidney disease: Secondary | ICD-10-CM | POA: Insufficient documentation

## 2013-07-07 DIAGNOSIS — I890 Lymphedema, not elsewhere classified: Secondary | ICD-10-CM | POA: Diagnosis not present

## 2013-07-07 DIAGNOSIS — IMO0001 Reserved for inherently not codable concepts without codable children: Secondary | ICD-10-CM | POA: Diagnosis not present

## 2013-09-18 DIAGNOSIS — Z9079 Acquired absence of other genital organ(s): Secondary | ICD-10-CM | POA: Diagnosis not present

## 2013-09-18 DIAGNOSIS — Z794 Long term (current) use of insulin: Secondary | ICD-10-CM | POA: Diagnosis not present

## 2013-09-18 DIAGNOSIS — E119 Type 2 diabetes mellitus without complications: Secondary | ICD-10-CM | POA: Diagnosis not present

## 2013-09-18 DIAGNOSIS — Z86010 Personal history of colonic polyps: Secondary | ICD-10-CM | POA: Diagnosis not present

## 2013-09-18 DIAGNOSIS — Z90710 Acquired absence of both cervix and uterus: Secondary | ICD-10-CM | POA: Diagnosis not present

## 2013-09-18 DIAGNOSIS — Z79899 Other long term (current) drug therapy: Secondary | ICD-10-CM | POA: Diagnosis not present

## 2013-09-18 DIAGNOSIS — I10 Essential (primary) hypertension: Secondary | ICD-10-CM | POA: Diagnosis not present

## 2013-09-18 DIAGNOSIS — E785 Hyperlipidemia, unspecified: Secondary | ICD-10-CM | POA: Diagnosis not present

## 2013-09-18 DIAGNOSIS — Z7982 Long term (current) use of aspirin: Secondary | ICD-10-CM | POA: Diagnosis not present

## 2013-09-18 DIAGNOSIS — R011 Cardiac murmur, unspecified: Secondary | ICD-10-CM | POA: Diagnosis not present

## 2013-09-18 DIAGNOSIS — I890 Lymphedema, not elsewhere classified: Secondary | ICD-10-CM | POA: Diagnosis not present

## 2013-09-18 DIAGNOSIS — C549 Malignant neoplasm of corpus uteri, unspecified: Secondary | ICD-10-CM | POA: Diagnosis not present

## 2013-09-23 DIAGNOSIS — E11359 Type 2 diabetes mellitus with proliferative diabetic retinopathy without macular edema: Secondary | ICD-10-CM | POA: Diagnosis not present

## 2013-09-23 DIAGNOSIS — E1139 Type 2 diabetes mellitus with other diabetic ophthalmic complication: Secondary | ICD-10-CM | POA: Diagnosis not present

## 2013-09-23 DIAGNOSIS — H4310 Vitreous hemorrhage, unspecified eye: Secondary | ICD-10-CM | POA: Diagnosis not present

## 2013-11-05 DIAGNOSIS — E1139 Type 2 diabetes mellitus with other diabetic ophthalmic complication: Secondary | ICD-10-CM | POA: Diagnosis not present

## 2013-11-05 DIAGNOSIS — D509 Iron deficiency anemia, unspecified: Secondary | ICD-10-CM | POA: Diagnosis not present

## 2013-11-05 DIAGNOSIS — Z23 Encounter for immunization: Secondary | ICD-10-CM | POA: Diagnosis not present

## 2013-11-05 DIAGNOSIS — E538 Deficiency of other specified B group vitamins: Secondary | ICD-10-CM | POA: Diagnosis not present

## 2013-11-05 DIAGNOSIS — I10 Essential (primary) hypertension: Secondary | ICD-10-CM | POA: Diagnosis not present

## 2013-11-05 DIAGNOSIS — E7800 Pure hypercholesterolemia, unspecified: Secondary | ICD-10-CM | POA: Diagnosis not present

## 2013-11-05 DIAGNOSIS — E11329 Type 2 diabetes mellitus with mild nonproliferative diabetic retinopathy without macular edema: Secondary | ICD-10-CM | POA: Diagnosis not present

## 2013-11-05 DIAGNOSIS — C549 Malignant neoplasm of corpus uteri, unspecified: Secondary | ICD-10-CM | POA: Diagnosis not present

## 2013-12-18 DIAGNOSIS — Z9079 Acquired absence of other genital organ(s): Secondary | ICD-10-CM | POA: Diagnosis not present

## 2013-12-18 DIAGNOSIS — I890 Lymphedema, not elsewhere classified: Secondary | ICD-10-CM | POA: Diagnosis not present

## 2013-12-18 DIAGNOSIS — Z90722 Acquired absence of ovaries, bilateral: Secondary | ICD-10-CM | POA: Diagnosis not present

## 2013-12-18 DIAGNOSIS — Z08 Encounter for follow-up examination after completed treatment for malignant neoplasm: Secondary | ICD-10-CM | POA: Diagnosis not present

## 2013-12-18 DIAGNOSIS — Z7982 Long term (current) use of aspirin: Secondary | ICD-10-CM | POA: Diagnosis not present

## 2013-12-18 DIAGNOSIS — C541 Malignant neoplasm of endometrium: Secondary | ICD-10-CM | POA: Diagnosis not present

## 2013-12-18 DIAGNOSIS — Z86010 Personal history of colonic polyps: Secondary | ICD-10-CM | POA: Diagnosis not present

## 2013-12-18 DIAGNOSIS — E119 Type 2 diabetes mellitus without complications: Secondary | ICD-10-CM | POA: Diagnosis not present

## 2013-12-18 DIAGNOSIS — Z90710 Acquired absence of both cervix and uterus: Secondary | ICD-10-CM | POA: Diagnosis not present

## 2013-12-18 DIAGNOSIS — Z79899 Other long term (current) drug therapy: Secondary | ICD-10-CM | POA: Diagnosis not present

## 2013-12-18 DIAGNOSIS — I10 Essential (primary) hypertension: Secondary | ICD-10-CM | POA: Diagnosis not present

## 2013-12-18 DIAGNOSIS — Z6834 Body mass index (BMI) 34.0-34.9, adult: Secondary | ICD-10-CM | POA: Diagnosis not present

## 2013-12-18 DIAGNOSIS — R011 Cardiac murmur, unspecified: Secondary | ICD-10-CM | POA: Diagnosis not present

## 2013-12-18 DIAGNOSIS — E669 Obesity, unspecified: Secondary | ICD-10-CM | POA: Diagnosis not present

## 2013-12-18 DIAGNOSIS — E785 Hyperlipidemia, unspecified: Secondary | ICD-10-CM | POA: Diagnosis not present

## 2013-12-18 DIAGNOSIS — Z8542 Personal history of malignant neoplasm of other parts of uterus: Secondary | ICD-10-CM | POA: Diagnosis not present

## 2014-02-23 DIAGNOSIS — C541 Malignant neoplasm of endometrium: Secondary | ICD-10-CM | POA: Diagnosis not present

## 2014-03-19 DIAGNOSIS — Z6834 Body mass index (BMI) 34.0-34.9, adult: Secondary | ICD-10-CM | POA: Diagnosis not present

## 2014-03-19 DIAGNOSIS — Z7982 Long term (current) use of aspirin: Secondary | ICD-10-CM | POA: Diagnosis not present

## 2014-03-19 DIAGNOSIS — Z8542 Personal history of malignant neoplasm of other parts of uterus: Secondary | ICD-10-CM | POA: Diagnosis not present

## 2014-03-19 DIAGNOSIS — E119 Type 2 diabetes mellitus without complications: Secondary | ICD-10-CM | POA: Diagnosis not present

## 2014-03-19 DIAGNOSIS — I10 Essential (primary) hypertension: Secondary | ICD-10-CM | POA: Diagnosis not present

## 2014-03-19 DIAGNOSIS — I890 Lymphedema, not elsewhere classified: Secondary | ICD-10-CM | POA: Diagnosis not present

## 2014-03-19 DIAGNOSIS — Z79899 Other long term (current) drug therapy: Secondary | ICD-10-CM | POA: Diagnosis not present

## 2014-03-19 DIAGNOSIS — Z08 Encounter for follow-up examination after completed treatment for malignant neoplasm: Secondary | ICD-10-CM | POA: Diagnosis not present

## 2014-03-19 DIAGNOSIS — E669 Obesity, unspecified: Secondary | ICD-10-CM | POA: Diagnosis not present

## 2014-03-19 DIAGNOSIS — Z90722 Acquired absence of ovaries, bilateral: Secondary | ICD-10-CM | POA: Diagnosis not present

## 2014-03-19 DIAGNOSIS — E785 Hyperlipidemia, unspecified: Secondary | ICD-10-CM | POA: Diagnosis not present

## 2014-03-19 DIAGNOSIS — Z9079 Acquired absence of other genital organ(s): Secondary | ICD-10-CM | POA: Diagnosis not present

## 2014-03-19 DIAGNOSIS — Z90710 Acquired absence of both cervix and uterus: Secondary | ICD-10-CM | POA: Diagnosis not present

## 2014-04-10 DIAGNOSIS — H2513 Age-related nuclear cataract, bilateral: Secondary | ICD-10-CM | POA: Diagnosis not present

## 2014-04-10 DIAGNOSIS — IMO0002 Reserved for concepts with insufficient information to code with codable children: Secondary | ICD-10-CM | POA: Insufficient documentation

## 2014-04-10 DIAGNOSIS — E11359 Type 2 diabetes mellitus with proliferative diabetic retinopathy without macular edema: Secondary | ICD-10-CM | POA: Diagnosis not present

## 2014-04-13 ENCOUNTER — Other Ambulatory Visit (HOSPITAL_COMMUNITY): Payer: Self-pay | Admitting: Family Medicine

## 2014-04-13 DIAGNOSIS — Z1231 Encounter for screening mammogram for malignant neoplasm of breast: Secondary | ICD-10-CM

## 2014-04-30 DIAGNOSIS — E780 Pure hypercholesterolemia: Secondary | ICD-10-CM | POA: Diagnosis not present

## 2014-04-30 DIAGNOSIS — E559 Vitamin D deficiency, unspecified: Secondary | ICD-10-CM | POA: Diagnosis not present

## 2014-04-30 DIAGNOSIS — E11339 Type 2 diabetes mellitus with moderate nonproliferative diabetic retinopathy without macular edema: Secondary | ICD-10-CM | POA: Diagnosis not present

## 2014-04-30 DIAGNOSIS — D509 Iron deficiency anemia, unspecified: Secondary | ICD-10-CM | POA: Diagnosis not present

## 2014-05-07 DIAGNOSIS — E785 Hyperlipidemia, unspecified: Secondary | ICD-10-CM | POA: Diagnosis not present

## 2014-05-07 DIAGNOSIS — Z0000 Encounter for general adult medical examination without abnormal findings: Secondary | ICD-10-CM | POA: Diagnosis not present

## 2014-05-07 DIAGNOSIS — N183 Chronic kidney disease, stage 3 (moderate): Secondary | ICD-10-CM | POA: Diagnosis not present

## 2014-05-07 DIAGNOSIS — C541 Malignant neoplasm of endometrium: Secondary | ICD-10-CM | POA: Diagnosis not present

## 2014-05-07 DIAGNOSIS — I10 Essential (primary) hypertension: Secondary | ICD-10-CM | POA: Diagnosis not present

## 2014-05-07 DIAGNOSIS — E11339 Type 2 diabetes mellitus with moderate nonproliferative diabetic retinopathy without macular edema: Secondary | ICD-10-CM | POA: Diagnosis not present

## 2014-05-07 DIAGNOSIS — E669 Obesity, unspecified: Secondary | ICD-10-CM | POA: Diagnosis not present

## 2014-05-07 DIAGNOSIS — D509 Iron deficiency anemia, unspecified: Secondary | ICD-10-CM | POA: Diagnosis not present

## 2014-05-13 ENCOUNTER — Other Ambulatory Visit (HOSPITAL_COMMUNITY): Payer: Self-pay | Admitting: Family Medicine

## 2014-05-13 ENCOUNTER — Ambulatory Visit (HOSPITAL_COMMUNITY)
Admission: RE | Admit: 2014-05-13 | Discharge: 2014-05-13 | Disposition: A | Discharge status: Home / Self Care | Payer: Medicare Other | Source: Ambulatory Visit | Attending: Family Medicine | Admitting: Family Medicine

## 2014-05-13 DIAGNOSIS — Z1231 Encounter for screening mammogram for malignant neoplasm of breast: Secondary | ICD-10-CM | POA: Diagnosis not present

## 2014-08-10 DIAGNOSIS — E1121 Type 2 diabetes mellitus with diabetic nephropathy: Secondary | ICD-10-CM | POA: Diagnosis not present

## 2014-08-10 DIAGNOSIS — R739 Hyperglycemia, unspecified: Secondary | ICD-10-CM | POA: Diagnosis not present

## 2014-09-24 DIAGNOSIS — Z8542 Personal history of malignant neoplasm of other parts of uterus: Secondary | ICD-10-CM | POA: Diagnosis not present

## 2014-09-24 DIAGNOSIS — E119 Type 2 diabetes mellitus without complications: Secondary | ICD-10-CM | POA: Diagnosis not present

## 2014-09-24 DIAGNOSIS — Z6831 Body mass index (BMI) 31.0-31.9, adult: Secondary | ICD-10-CM | POA: Diagnosis not present

## 2014-09-24 DIAGNOSIS — Z90722 Acquired absence of ovaries, bilateral: Secondary | ICD-10-CM | POA: Diagnosis not present

## 2014-09-24 DIAGNOSIS — I890 Lymphedema, not elsewhere classified: Secondary | ICD-10-CM | POA: Diagnosis not present

## 2014-09-24 DIAGNOSIS — C541 Malignant neoplasm of endometrium: Secondary | ICD-10-CM | POA: Diagnosis not present

## 2014-09-24 DIAGNOSIS — Z79899 Other long term (current) drug therapy: Secondary | ICD-10-CM | POA: Diagnosis not present

## 2014-09-24 DIAGNOSIS — E669 Obesity, unspecified: Secondary | ICD-10-CM | POA: Diagnosis not present

## 2014-09-24 DIAGNOSIS — Z08 Encounter for follow-up examination after completed treatment for malignant neoplasm: Secondary | ICD-10-CM | POA: Diagnosis not present

## 2014-09-24 DIAGNOSIS — Z90710 Acquired absence of both cervix and uterus: Secondary | ICD-10-CM | POA: Diagnosis not present

## 2014-11-17 DIAGNOSIS — E11359 Type 2 diabetes mellitus with proliferative diabetic retinopathy without macular edema: Secondary | ICD-10-CM | POA: Diagnosis not present

## 2014-11-17 DIAGNOSIS — H35033 Hypertensive retinopathy, bilateral: Secondary | ICD-10-CM | POA: Diagnosis not present

## 2014-11-17 DIAGNOSIS — H2513 Age-related nuclear cataract, bilateral: Secondary | ICD-10-CM | POA: Diagnosis not present

## 2014-11-17 DIAGNOSIS — H35371 Puckering of macula, right eye: Secondary | ICD-10-CM | POA: Diagnosis not present

## 2014-11-19 DIAGNOSIS — Z23 Encounter for immunization: Secondary | ICD-10-CM | POA: Diagnosis not present

## 2014-11-19 DIAGNOSIS — E11339 Type 2 diabetes mellitus with moderate nonproliferative diabetic retinopathy without macular edema: Secondary | ICD-10-CM | POA: Diagnosis not present

## 2014-11-19 DIAGNOSIS — E785 Hyperlipidemia, unspecified: Secondary | ICD-10-CM | POA: Diagnosis not present

## 2014-11-19 DIAGNOSIS — D509 Iron deficiency anemia, unspecified: Secondary | ICD-10-CM | POA: Diagnosis not present

## 2014-12-31 DIAGNOSIS — Z9079 Acquired absence of other genital organ(s): Secondary | ICD-10-CM | POA: Diagnosis not present

## 2014-12-31 DIAGNOSIS — E119 Type 2 diabetes mellitus without complications: Secondary | ICD-10-CM | POA: Diagnosis not present

## 2014-12-31 DIAGNOSIS — Z90722 Acquired absence of ovaries, bilateral: Secondary | ICD-10-CM | POA: Diagnosis not present

## 2014-12-31 DIAGNOSIS — Z8542 Personal history of malignant neoplasm of other parts of uterus: Secondary | ICD-10-CM | POA: Diagnosis not present

## 2014-12-31 DIAGNOSIS — Z90710 Acquired absence of both cervix and uterus: Secondary | ICD-10-CM | POA: Diagnosis not present

## 2014-12-31 DIAGNOSIS — Z7982 Long term (current) use of aspirin: Secondary | ICD-10-CM | POA: Diagnosis not present

## 2014-12-31 DIAGNOSIS — Z08 Encounter for follow-up examination after completed treatment for malignant neoplasm: Secondary | ICD-10-CM | POA: Diagnosis not present

## 2014-12-31 DIAGNOSIS — I10 Essential (primary) hypertension: Secondary | ICD-10-CM | POA: Diagnosis not present

## 2014-12-31 DIAGNOSIS — I890 Lymphedema, not elsewhere classified: Secondary | ICD-10-CM | POA: Diagnosis not present

## 2014-12-31 DIAGNOSIS — Z86010 Personal history of colonic polyps: Secondary | ICD-10-CM | POA: Diagnosis not present

## 2014-12-31 DIAGNOSIS — C541 Malignant neoplasm of endometrium: Secondary | ICD-10-CM | POA: Diagnosis not present

## 2014-12-31 DIAGNOSIS — Z923 Personal history of irradiation: Secondary | ICD-10-CM | POA: Diagnosis not present

## 2015-04-07 ENCOUNTER — Other Ambulatory Visit: Payer: Self-pay

## 2015-04-07 DIAGNOSIS — Z1231 Encounter for screening mammogram for malignant neoplasm of breast: Secondary | ICD-10-CM

## 2015-04-12 DIAGNOSIS — E669 Obesity, unspecified: Secondary | ICD-10-CM | POA: Diagnosis not present

## 2015-04-12 DIAGNOSIS — Z08 Encounter for follow-up examination after completed treatment for malignant neoplasm: Secondary | ICD-10-CM | POA: Diagnosis not present

## 2015-04-12 DIAGNOSIS — Z8542 Personal history of malignant neoplasm of other parts of uterus: Secondary | ICD-10-CM | POA: Diagnosis not present

## 2015-04-12 DIAGNOSIS — Z7982 Long term (current) use of aspirin: Secondary | ICD-10-CM | POA: Diagnosis not present

## 2015-04-12 DIAGNOSIS — Z6829 Body mass index (BMI) 29.0-29.9, adult: Secondary | ICD-10-CM | POA: Diagnosis not present

## 2015-04-12 DIAGNOSIS — Z9889 Other specified postprocedural states: Secondary | ICD-10-CM | POA: Diagnosis not present

## 2015-04-12 DIAGNOSIS — Z90710 Acquired absence of both cervix and uterus: Secondary | ICD-10-CM | POA: Diagnosis not present

## 2015-04-12 DIAGNOSIS — Z90722 Acquired absence of ovaries, bilateral: Secondary | ICD-10-CM | POA: Diagnosis not present

## 2015-04-12 DIAGNOSIS — Z79899 Other long term (current) drug therapy: Secondary | ICD-10-CM | POA: Diagnosis not present

## 2015-04-12 DIAGNOSIS — I890 Lymphedema, not elsewhere classified: Secondary | ICD-10-CM | POA: Diagnosis not present

## 2015-04-12 DIAGNOSIS — I10 Essential (primary) hypertension: Secondary | ICD-10-CM | POA: Diagnosis not present

## 2015-04-12 DIAGNOSIS — E119 Type 2 diabetes mellitus without complications: Secondary | ICD-10-CM | POA: Diagnosis not present

## 2015-04-12 DIAGNOSIS — Z7984 Long term (current) use of oral hypoglycemic drugs: Secondary | ICD-10-CM | POA: Diagnosis not present

## 2015-04-28 DIAGNOSIS — H2513 Age-related nuclear cataract, bilateral: Secondary | ICD-10-CM | POA: Diagnosis not present

## 2015-04-28 DIAGNOSIS — H269 Unspecified cataract: Secondary | ICD-10-CM | POA: Diagnosis not present

## 2015-05-19 DIAGNOSIS — E785 Hyperlipidemia, unspecified: Secondary | ICD-10-CM | POA: Diagnosis not present

## 2015-05-19 DIAGNOSIS — E113393 Type 2 diabetes mellitus with moderate nonproliferative diabetic retinopathy without macular edema, bilateral: Secondary | ICD-10-CM | POA: Diagnosis not present

## 2015-05-19 DIAGNOSIS — Z7984 Long term (current) use of oral hypoglycemic drugs: Secondary | ICD-10-CM | POA: Diagnosis not present

## 2015-05-19 DIAGNOSIS — I10 Essential (primary) hypertension: Secondary | ICD-10-CM | POA: Diagnosis not present

## 2015-05-19 DIAGNOSIS — Z0000 Encounter for general adult medical examination without abnormal findings: Secondary | ICD-10-CM | POA: Diagnosis not present

## 2015-05-21 ENCOUNTER — Ambulatory Visit
Admission: RE | Admit: 2015-05-21 | Discharge: 2015-05-21 | Disposition: A | Discharge status: Home / Self Care | Payer: Medicare Other | Source: Ambulatory Visit

## 2015-05-21 DIAGNOSIS — Z1231 Encounter for screening mammogram for malignant neoplasm of breast: Secondary | ICD-10-CM

## 2015-06-17 DIAGNOSIS — Z8669 Personal history of other diseases of the nervous system and sense organs: Secondary | ICD-10-CM | POA: Diagnosis not present

## 2015-06-17 DIAGNOSIS — R011 Cardiac murmur, unspecified: Secondary | ICD-10-CM | POA: Diagnosis not present

## 2015-06-17 DIAGNOSIS — E119 Type 2 diabetes mellitus without complications: Secondary | ICD-10-CM | POA: Diagnosis not present

## 2015-06-17 DIAGNOSIS — H2513 Age-related nuclear cataract, bilateral: Secondary | ICD-10-CM | POA: Diagnosis not present

## 2015-06-17 DIAGNOSIS — H2511 Age-related nuclear cataract, right eye: Secondary | ICD-10-CM | POA: Diagnosis not present

## 2015-06-17 DIAGNOSIS — I10 Essential (primary) hypertension: Secondary | ICD-10-CM | POA: Diagnosis not present

## 2015-06-24 DIAGNOSIS — H2512 Age-related nuclear cataract, left eye: Secondary | ICD-10-CM | POA: Diagnosis not present

## 2015-06-24 DIAGNOSIS — Z7984 Long term (current) use of oral hypoglycemic drugs: Secondary | ICD-10-CM | POA: Diagnosis not present

## 2015-06-24 DIAGNOSIS — R011 Cardiac murmur, unspecified: Secondary | ICD-10-CM | POA: Diagnosis not present

## 2015-06-24 DIAGNOSIS — Z79899 Other long term (current) drug therapy: Secondary | ICD-10-CM | POA: Diagnosis not present

## 2015-06-24 DIAGNOSIS — M1990 Unspecified osteoarthritis, unspecified site: Secondary | ICD-10-CM | POA: Diagnosis not present

## 2015-06-24 DIAGNOSIS — H25811 Combined forms of age-related cataract, right eye: Secondary | ICD-10-CM | POA: Diagnosis not present

## 2015-06-24 DIAGNOSIS — I10 Essential (primary) hypertension: Secondary | ICD-10-CM | POA: Diagnosis not present

## 2015-06-24 DIAGNOSIS — E113593 Type 2 diabetes mellitus with proliferative diabetic retinopathy without macular edema, bilateral: Secondary | ICD-10-CM | POA: Diagnosis not present

## 2015-06-24 DIAGNOSIS — R0989 Other specified symptoms and signs involving the circulatory and respiratory systems: Secondary | ICD-10-CM | POA: Diagnosis not present

## 2015-06-24 DIAGNOSIS — Z7982 Long term (current) use of aspirin: Secondary | ICD-10-CM | POA: Diagnosis not present

## 2015-06-24 DIAGNOSIS — H35033 Hypertensive retinopathy, bilateral: Secondary | ICD-10-CM | POA: Diagnosis not present

## 2015-07-12 DIAGNOSIS — Z90722 Acquired absence of ovaries, bilateral: Secondary | ICD-10-CM | POA: Diagnosis not present

## 2015-07-12 DIAGNOSIS — Z90710 Acquired absence of both cervix and uterus: Secondary | ICD-10-CM | POA: Diagnosis not present

## 2015-07-12 DIAGNOSIS — E669 Obesity, unspecified: Secondary | ICD-10-CM | POA: Diagnosis not present

## 2015-07-12 DIAGNOSIS — Z08 Encounter for follow-up examination after completed treatment for malignant neoplasm: Secondary | ICD-10-CM | POA: Diagnosis not present

## 2015-07-12 DIAGNOSIS — Z9079 Acquired absence of other genital organ(s): Secondary | ICD-10-CM | POA: Diagnosis not present

## 2015-07-12 DIAGNOSIS — I890 Lymphedema, not elsewhere classified: Secondary | ICD-10-CM | POA: Diagnosis not present

## 2015-07-12 DIAGNOSIS — Z8542 Personal history of malignant neoplasm of other parts of uterus: Secondary | ICD-10-CM | POA: Diagnosis not present

## 2015-07-12 DIAGNOSIS — C541 Malignant neoplasm of endometrium: Secondary | ICD-10-CM | POA: Diagnosis not present

## 2015-07-12 DIAGNOSIS — Z6828 Body mass index (BMI) 28.0-28.9, adult: Secondary | ICD-10-CM | POA: Diagnosis not present

## 2015-07-15 DIAGNOSIS — I10 Essential (primary) hypertension: Secondary | ICD-10-CM | POA: Diagnosis not present

## 2015-07-15 DIAGNOSIS — Z7984 Long term (current) use of oral hypoglycemic drugs: Secondary | ICD-10-CM | POA: Diagnosis not present

## 2015-07-15 DIAGNOSIS — Z79899 Other long term (current) drug therapy: Secondary | ICD-10-CM | POA: Diagnosis not present

## 2015-07-15 DIAGNOSIS — E11319 Type 2 diabetes mellitus with unspecified diabetic retinopathy without macular edema: Secondary | ICD-10-CM | POA: Diagnosis not present

## 2015-07-15 DIAGNOSIS — H25811 Combined forms of age-related cataract, right eye: Secondary | ICD-10-CM | POA: Diagnosis not present

## 2015-07-15 DIAGNOSIS — H25813 Combined forms of age-related cataract, bilateral: Secondary | ICD-10-CM | POA: Diagnosis not present

## 2015-07-15 DIAGNOSIS — Z7982 Long term (current) use of aspirin: Secondary | ICD-10-CM | POA: Diagnosis not present

## 2015-10-08 DIAGNOSIS — H26493 Other secondary cataract, bilateral: Secondary | ICD-10-CM | POA: Diagnosis not present

## 2015-11-05 DIAGNOSIS — H26492 Other secondary cataract, left eye: Secondary | ICD-10-CM | POA: Diagnosis not present

## 2015-11-19 DIAGNOSIS — E785 Hyperlipidemia, unspecified: Secondary | ICD-10-CM | POA: Diagnosis not present

## 2015-11-19 DIAGNOSIS — E113393 Type 2 diabetes mellitus with moderate nonproliferative diabetic retinopathy without macular edema, bilateral: Secondary | ICD-10-CM | POA: Diagnosis not present

## 2015-11-19 DIAGNOSIS — Z23 Encounter for immunization: Secondary | ICD-10-CM | POA: Diagnosis not present

## 2015-11-19 DIAGNOSIS — I10 Essential (primary) hypertension: Secondary | ICD-10-CM | POA: Diagnosis not present

## 2015-11-19 DIAGNOSIS — Z7984 Long term (current) use of oral hypoglycemic drugs: Secondary | ICD-10-CM | POA: Diagnosis not present

## 2015-11-23 DIAGNOSIS — H35371 Puckering of macula, right eye: Secondary | ICD-10-CM | POA: Diagnosis not present

## 2015-11-23 DIAGNOSIS — Z961 Presence of intraocular lens: Secondary | ICD-10-CM | POA: Insufficient documentation

## 2015-11-23 DIAGNOSIS — H35033 Hypertensive retinopathy, bilateral: Secondary | ICD-10-CM | POA: Diagnosis not present

## 2015-11-23 DIAGNOSIS — H2513 Age-related nuclear cataract, bilateral: Secondary | ICD-10-CM | POA: Insufficient documentation

## 2015-11-23 DIAGNOSIS — E113593 Type 2 diabetes mellitus with proliferative diabetic retinopathy without macular edema, bilateral: Secondary | ICD-10-CM | POA: Diagnosis not present

## 2016-02-23 DIAGNOSIS — M2042 Other hammer toe(s) (acquired), left foot: Secondary | ICD-10-CM | POA: Diagnosis not present

## 2016-02-23 DIAGNOSIS — L97521 Non-pressure chronic ulcer of other part of left foot limited to breakdown of skin: Secondary | ICD-10-CM | POA: Diagnosis not present

## 2016-02-23 DIAGNOSIS — L600 Ingrowing nail: Secondary | ICD-10-CM | POA: Diagnosis not present

## 2016-02-24 DIAGNOSIS — Z9889 Other specified postprocedural states: Secondary | ICD-10-CM | POA: Diagnosis not present

## 2016-02-24 DIAGNOSIS — Z6828 Body mass index (BMI) 28.0-28.9, adult: Secondary | ICD-10-CM | POA: Diagnosis not present

## 2016-02-24 DIAGNOSIS — Z90710 Acquired absence of both cervix and uterus: Secondary | ICD-10-CM | POA: Diagnosis not present

## 2016-02-24 DIAGNOSIS — C541 Malignant neoplasm of endometrium: Secondary | ICD-10-CM | POA: Diagnosis not present

## 2016-02-24 DIAGNOSIS — Z8542 Personal history of malignant neoplasm of other parts of uterus: Secondary | ICD-10-CM | POA: Diagnosis not present

## 2016-02-24 DIAGNOSIS — E669 Obesity, unspecified: Secondary | ICD-10-CM | POA: Diagnosis not present

## 2016-02-24 DIAGNOSIS — Z08 Encounter for follow-up examination after completed treatment for malignant neoplasm: Secondary | ICD-10-CM | POA: Diagnosis not present

## 2016-02-24 DIAGNOSIS — Z90722 Acquired absence of ovaries, bilateral: Secondary | ICD-10-CM | POA: Diagnosis not present

## 2016-02-24 DIAGNOSIS — Z9079 Acquired absence of other genital organ(s): Secondary | ICD-10-CM | POA: Diagnosis not present

## 2016-03-07 DIAGNOSIS — R05 Cough: Secondary | ICD-10-CM | POA: Diagnosis not present

## 2016-04-05 DIAGNOSIS — M79672 Pain in left foot: Secondary | ICD-10-CM | POA: Diagnosis not present

## 2016-04-05 DIAGNOSIS — B351 Tinea unguium: Secondary | ICD-10-CM | POA: Diagnosis not present

## 2016-04-05 DIAGNOSIS — M79671 Pain in right foot: Secondary | ICD-10-CM | POA: Diagnosis not present

## 2016-04-10 ENCOUNTER — Other Ambulatory Visit: Payer: Self-pay | Admitting: Family Medicine

## 2016-04-10 DIAGNOSIS — Z1231 Encounter for screening mammogram for malignant neoplasm of breast: Secondary | ICD-10-CM

## 2016-05-03 DIAGNOSIS — M2042 Other hammer toe(s) (acquired), left foot: Secondary | ICD-10-CM | POA: Diagnosis not present

## 2016-05-17 DIAGNOSIS — Z7984 Long term (current) use of oral hypoglycemic drugs: Secondary | ICD-10-CM | POA: Diagnosis not present

## 2016-05-17 DIAGNOSIS — I10 Essential (primary) hypertension: Secondary | ICD-10-CM | POA: Diagnosis not present

## 2016-05-17 DIAGNOSIS — E113393 Type 2 diabetes mellitus with moderate nonproliferative diabetic retinopathy without macular edema, bilateral: Secondary | ICD-10-CM | POA: Diagnosis not present

## 2016-05-22 ENCOUNTER — Ambulatory Visit
Admission: RE | Admit: 2016-05-22 | Discharge: 2016-05-22 | Disposition: A | Discharge status: Home / Self Care | Payer: Medicare Other | Source: Ambulatory Visit | Attending: Family Medicine | Admitting: Family Medicine

## 2016-05-22 DIAGNOSIS — Z1231 Encounter for screening mammogram for malignant neoplasm of breast: Secondary | ICD-10-CM | POA: Diagnosis not present

## 2016-09-07 DIAGNOSIS — Z90722 Acquired absence of ovaries, bilateral: Secondary | ICD-10-CM | POA: Diagnosis not present

## 2016-09-07 DIAGNOSIS — Z6828 Body mass index (BMI) 28.0-28.9, adult: Secondary | ICD-10-CM | POA: Diagnosis not present

## 2016-09-07 DIAGNOSIS — Z90710 Acquired absence of both cervix and uterus: Secondary | ICD-10-CM | POA: Diagnosis not present

## 2016-09-07 DIAGNOSIS — Z9079 Acquired absence of other genital organ(s): Secondary | ICD-10-CM | POA: Diagnosis not present

## 2016-09-07 DIAGNOSIS — E669 Obesity, unspecified: Secondary | ICD-10-CM | POA: Diagnosis not present

## 2016-09-07 DIAGNOSIS — C541 Malignant neoplasm of endometrium: Secondary | ICD-10-CM | POA: Diagnosis not present

## 2016-09-07 DIAGNOSIS — Z08 Encounter for follow-up examination after completed treatment for malignant neoplasm: Secondary | ICD-10-CM | POA: Diagnosis not present

## 2016-09-07 DIAGNOSIS — E663 Overweight: Secondary | ICD-10-CM | POA: Diagnosis not present

## 2016-09-07 DIAGNOSIS — Z8542 Personal history of malignant neoplasm of other parts of uterus: Secondary | ICD-10-CM | POA: Diagnosis not present

## 2016-11-14 DIAGNOSIS — E113319 Type 2 diabetes mellitus with moderate nonproliferative diabetic retinopathy with macular edema, unspecified eye: Secondary | ICD-10-CM | POA: Diagnosis not present

## 2016-11-14 DIAGNOSIS — D509 Iron deficiency anemia, unspecified: Secondary | ICD-10-CM | POA: Diagnosis not present

## 2016-11-14 DIAGNOSIS — E785 Hyperlipidemia, unspecified: Secondary | ICD-10-CM | POA: Diagnosis not present

## 2016-11-14 DIAGNOSIS — E113313 Type 2 diabetes mellitus with moderate nonproliferative diabetic retinopathy with macular edema, bilateral: Secondary | ICD-10-CM | POA: Diagnosis not present

## 2016-11-14 DIAGNOSIS — Z7984 Long term (current) use of oral hypoglycemic drugs: Secondary | ICD-10-CM | POA: Diagnosis not present

## 2016-11-14 DIAGNOSIS — E538 Deficiency of other specified B group vitamins: Secondary | ICD-10-CM | POA: Diagnosis not present

## 2016-11-14 DIAGNOSIS — Z23 Encounter for immunization: Secondary | ICD-10-CM | POA: Diagnosis not present

## 2016-11-14 DIAGNOSIS — E113399 Type 2 diabetes mellitus with moderate nonproliferative diabetic retinopathy without macular edema, unspecified eye: Secondary | ICD-10-CM | POA: Diagnosis not present

## 2016-11-14 DIAGNOSIS — Z0000 Encounter for general adult medical examination without abnormal findings: Secondary | ICD-10-CM | POA: Diagnosis not present

## 2016-11-28 DIAGNOSIS — H35371 Puckering of macula, right eye: Secondary | ICD-10-CM | POA: Diagnosis not present

## 2016-11-28 DIAGNOSIS — Z961 Presence of intraocular lens: Secondary | ICD-10-CM | POA: Diagnosis not present

## 2016-11-28 DIAGNOSIS — E113593 Type 2 diabetes mellitus with proliferative diabetic retinopathy without macular edema, bilateral: Secondary | ICD-10-CM | POA: Diagnosis not present

## 2016-11-28 DIAGNOSIS — H26492 Other secondary cataract, left eye: Secondary | ICD-10-CM | POA: Diagnosis not present

## 2016-11-28 DIAGNOSIS — H35033 Hypertensive retinopathy, bilateral: Secondary | ICD-10-CM | POA: Diagnosis not present

## 2017-01-22 DIAGNOSIS — E2839 Other primary ovarian failure: Secondary | ICD-10-CM | POA: Diagnosis not present

## 2017-04-12 DIAGNOSIS — Z923 Personal history of irradiation: Secondary | ICD-10-CM | POA: Diagnosis not present

## 2017-04-12 DIAGNOSIS — E669 Obesity, unspecified: Secondary | ICD-10-CM | POA: Diagnosis not present

## 2017-04-12 DIAGNOSIS — Z8542 Personal history of malignant neoplasm of other parts of uterus: Secondary | ICD-10-CM | POA: Diagnosis not present

## 2017-04-12 DIAGNOSIS — Z90722 Acquired absence of ovaries, bilateral: Secondary | ICD-10-CM | POA: Diagnosis not present

## 2017-04-12 DIAGNOSIS — Z90710 Acquired absence of both cervix and uterus: Secondary | ICD-10-CM | POA: Diagnosis not present

## 2017-04-12 DIAGNOSIS — Z9079 Acquired absence of other genital organ(s): Secondary | ICD-10-CM | POA: Diagnosis not present

## 2017-04-12 DIAGNOSIS — Z6829 Body mass index (BMI) 29.0-29.9, adult: Secondary | ICD-10-CM | POA: Diagnosis not present

## 2017-04-12 DIAGNOSIS — I10 Essential (primary) hypertension: Secondary | ICD-10-CM | POA: Diagnosis not present

## 2017-04-12 DIAGNOSIS — E119 Type 2 diabetes mellitus without complications: Secondary | ICD-10-CM | POA: Diagnosis not present

## 2017-04-12 DIAGNOSIS — Z08 Encounter for follow-up examination after completed treatment for malignant neoplasm: Secondary | ICD-10-CM | POA: Diagnosis not present

## 2017-04-13 ENCOUNTER — Other Ambulatory Visit: Payer: Self-pay | Admitting: Family Medicine

## 2017-04-13 DIAGNOSIS — Z1231 Encounter for screening mammogram for malignant neoplasm of breast: Secondary | ICD-10-CM

## 2017-05-14 DIAGNOSIS — E113393 Type 2 diabetes mellitus with moderate nonproliferative diabetic retinopathy without macular edema, bilateral: Secondary | ICD-10-CM | POA: Diagnosis not present

## 2017-05-14 DIAGNOSIS — D509 Iron deficiency anemia, unspecified: Secondary | ICD-10-CM | POA: Diagnosis not present

## 2017-05-14 DIAGNOSIS — I10 Essential (primary) hypertension: Secondary | ICD-10-CM | POA: Diagnosis not present

## 2017-05-14 DIAGNOSIS — E538 Deficiency of other specified B group vitamins: Secondary | ICD-10-CM | POA: Diagnosis not present

## 2017-05-14 DIAGNOSIS — E785 Hyperlipidemia, unspecified: Secondary | ICD-10-CM | POA: Diagnosis not present

## 2017-05-23 ENCOUNTER — Ambulatory Visit
Admission: RE | Admit: 2017-05-23 | Discharge: 2017-05-23 | Disposition: A | Discharge status: Home / Self Care | Payer: Medicare Other | Source: Ambulatory Visit | Attending: Family Medicine | Admitting: Family Medicine

## 2017-05-23 DIAGNOSIS — Z1231 Encounter for screening mammogram for malignant neoplasm of breast: Secondary | ICD-10-CM | POA: Diagnosis not present

## 2017-08-02 DIAGNOSIS — E119 Type 2 diabetes mellitus without complications: Secondary | ICD-10-CM | POA: Diagnosis not present

## 2017-10-02 DIAGNOSIS — H26492 Other secondary cataract, left eye: Secondary | ICD-10-CM | POA: Diagnosis not present

## 2017-10-02 DIAGNOSIS — E113593 Type 2 diabetes mellitus with proliferative diabetic retinopathy without macular edema, bilateral: Secondary | ICD-10-CM | POA: Diagnosis not present

## 2017-10-02 DIAGNOSIS — Z961 Presence of intraocular lens: Secondary | ICD-10-CM | POA: Diagnosis not present

## 2017-10-02 DIAGNOSIS — Z7984 Long term (current) use of oral hypoglycemic drugs: Secondary | ICD-10-CM | POA: Diagnosis not present

## 2017-10-02 DIAGNOSIS — H35371 Puckering of macula, right eye: Secondary | ICD-10-CM | POA: Diagnosis not present

## 2017-10-02 DIAGNOSIS — H35033 Hypertensive retinopathy, bilateral: Secondary | ICD-10-CM | POA: Diagnosis not present

## 2017-10-18 DIAGNOSIS — Z08 Encounter for follow-up examination after completed treatment for malignant neoplasm: Secondary | ICD-10-CM | POA: Diagnosis not present

## 2017-10-18 DIAGNOSIS — C541 Malignant neoplasm of endometrium: Secondary | ICD-10-CM | POA: Diagnosis not present

## 2017-10-18 DIAGNOSIS — E669 Obesity, unspecified: Secondary | ICD-10-CM | POA: Diagnosis not present

## 2017-10-18 DIAGNOSIS — Z90722 Acquired absence of ovaries, bilateral: Secondary | ICD-10-CM | POA: Diagnosis not present

## 2017-10-18 DIAGNOSIS — R102 Pelvic and perineal pain: Secondary | ICD-10-CM | POA: Diagnosis not present

## 2017-10-18 DIAGNOSIS — Z90710 Acquired absence of both cervix and uterus: Secondary | ICD-10-CM | POA: Diagnosis not present

## 2017-10-18 DIAGNOSIS — Z6829 Body mass index (BMI) 29.0-29.9, adult: Secondary | ICD-10-CM | POA: Diagnosis not present

## 2017-10-18 DIAGNOSIS — Z9079 Acquired absence of other genital organ(s): Secondary | ICD-10-CM | POA: Diagnosis not present

## 2017-10-18 DIAGNOSIS — Z8542 Personal history of malignant neoplasm of other parts of uterus: Secondary | ICD-10-CM | POA: Diagnosis not present

## 2017-12-11 DIAGNOSIS — D509 Iron deficiency anemia, unspecified: Secondary | ICD-10-CM | POA: Diagnosis not present

## 2017-12-11 DIAGNOSIS — E785 Hyperlipidemia, unspecified: Secondary | ICD-10-CM | POA: Diagnosis not present

## 2017-12-11 DIAGNOSIS — Z1211 Encounter for screening for malignant neoplasm of colon: Secondary | ICD-10-CM | POA: Diagnosis not present

## 2017-12-11 DIAGNOSIS — N183 Chronic kidney disease, stage 3 (moderate): Secondary | ICD-10-CM | POA: Diagnosis not present

## 2017-12-11 DIAGNOSIS — I10 Essential (primary) hypertension: Secondary | ICD-10-CM | POA: Diagnosis not present

## 2017-12-11 DIAGNOSIS — E538 Deficiency of other specified B group vitamins: Secondary | ICD-10-CM | POA: Diagnosis not present

## 2017-12-11 DIAGNOSIS — Z7984 Long term (current) use of oral hypoglycemic drugs: Secondary | ICD-10-CM | POA: Diagnosis not present

## 2017-12-11 DIAGNOSIS — Z23 Encounter for immunization: Secondary | ICD-10-CM | POA: Diagnosis not present

## 2017-12-11 DIAGNOSIS — E113393 Type 2 diabetes mellitus with moderate nonproliferative diabetic retinopathy without macular edema, bilateral: Secondary | ICD-10-CM | POA: Diagnosis not present

## 2017-12-11 DIAGNOSIS — Z0000 Encounter for general adult medical examination without abnormal findings: Secondary | ICD-10-CM | POA: Diagnosis not present

## 2017-12-11 DIAGNOSIS — C541 Malignant neoplasm of endometrium: Secondary | ICD-10-CM | POA: Diagnosis not present

## 2017-12-25 DIAGNOSIS — H35373 Puckering of macula, bilateral: Secondary | ICD-10-CM | POA: Diagnosis not present

## 2017-12-25 DIAGNOSIS — Z961 Presence of intraocular lens: Secondary | ICD-10-CM | POA: Diagnosis not present

## 2017-12-25 DIAGNOSIS — E113593 Type 2 diabetes mellitus with proliferative diabetic retinopathy without macular edema, bilateral: Secondary | ICD-10-CM | POA: Diagnosis not present

## 2017-12-25 DIAGNOSIS — H35033 Hypertensive retinopathy, bilateral: Secondary | ICD-10-CM | POA: Diagnosis not present

## 2017-12-25 DIAGNOSIS — H26492 Other secondary cataract, left eye: Secondary | ICD-10-CM | POA: Diagnosis not present

## 2018-01-24 DIAGNOSIS — R195 Other fecal abnormalities: Secondary | ICD-10-CM | POA: Diagnosis not present

## 2018-02-28 DIAGNOSIS — R195 Other fecal abnormalities: Secondary | ICD-10-CM | POA: Diagnosis not present

## 2018-02-28 DIAGNOSIS — D123 Benign neoplasm of transverse colon: Secondary | ICD-10-CM | POA: Diagnosis not present

## 2018-02-28 DIAGNOSIS — K5730 Diverticulosis of large intestine without perforation or abscess without bleeding: Secondary | ICD-10-CM | POA: Diagnosis not present

## 2018-02-28 DIAGNOSIS — D121 Benign neoplasm of appendix: Secondary | ICD-10-CM | POA: Diagnosis not present

## 2018-03-05 DIAGNOSIS — D121 Benign neoplasm of appendix: Secondary | ICD-10-CM | POA: Diagnosis not present

## 2018-03-05 DIAGNOSIS — D123 Benign neoplasm of transverse colon: Secondary | ICD-10-CM | POA: Diagnosis not present

## 2018-04-16 ENCOUNTER — Other Ambulatory Visit: Payer: Self-pay | Admitting: Family Medicine

## 2018-04-16 DIAGNOSIS — Z1231 Encounter for screening mammogram for malignant neoplasm of breast: Secondary | ICD-10-CM

## 2018-04-25 DIAGNOSIS — Z90710 Acquired absence of both cervix and uterus: Secondary | ICD-10-CM | POA: Diagnosis not present

## 2018-04-25 DIAGNOSIS — E119 Type 2 diabetes mellitus without complications: Secondary | ICD-10-CM | POA: Diagnosis not present

## 2018-04-25 DIAGNOSIS — Z7984 Long term (current) use of oral hypoglycemic drugs: Secondary | ICD-10-CM | POA: Diagnosis not present

## 2018-04-25 DIAGNOSIS — C541 Malignant neoplasm of endometrium: Secondary | ICD-10-CM | POA: Diagnosis not present

## 2018-04-25 DIAGNOSIS — Z08 Encounter for follow-up examination after completed treatment for malignant neoplasm: Secondary | ICD-10-CM | POA: Diagnosis not present

## 2018-04-25 DIAGNOSIS — I10 Essential (primary) hypertension: Secondary | ICD-10-CM | POA: Diagnosis not present

## 2018-04-25 DIAGNOSIS — Z8542 Personal history of malignant neoplasm of other parts of uterus: Secondary | ICD-10-CM | POA: Diagnosis not present

## 2018-04-25 DIAGNOSIS — E785 Hyperlipidemia, unspecified: Secondary | ICD-10-CM | POA: Diagnosis not present

## 2018-05-27 ENCOUNTER — Ambulatory Visit: Payer: Medicare Other

## 2018-08-05 ENCOUNTER — Other Ambulatory Visit: Payer: Self-pay

## 2018-08-05 ENCOUNTER — Ambulatory Visit
Admission: RE | Admit: 2018-08-05 | Discharge: 2018-08-05 | Disposition: A | Discharge status: Home / Self Care | Payer: Medicare Other | Source: Ambulatory Visit | Attending: Family Medicine | Admitting: Family Medicine

## 2018-08-05 DIAGNOSIS — Z1231 Encounter for screening mammogram for malignant neoplasm of breast: Secondary | ICD-10-CM | POA: Diagnosis not present

## 2018-08-05 IMAGING — MG DIGITAL SCREENING BILATERAL MAMMOGRAM WITH TOMO AND CAD
8 series · 8 of 24 positions shown · non-contrast
Comparison: Previous exam(s).

CLINICAL DATA: Screening.

EXAM:
DIGITAL SCREENING BILATERAL MAMMOGRAM WITH TOMO AND CAD

[R MLO synth-2D]
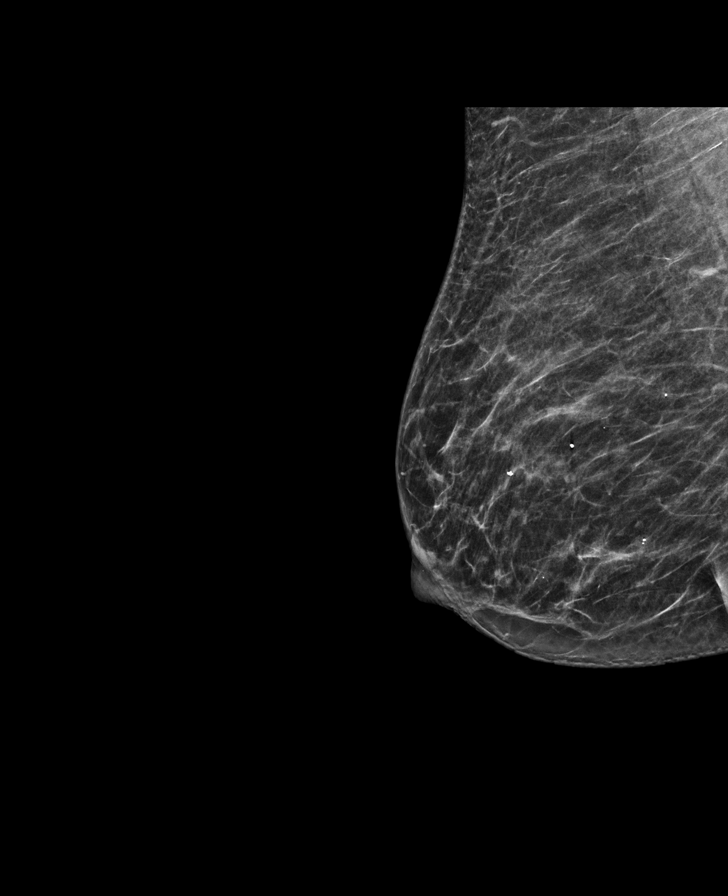

[R CC synth-2D]
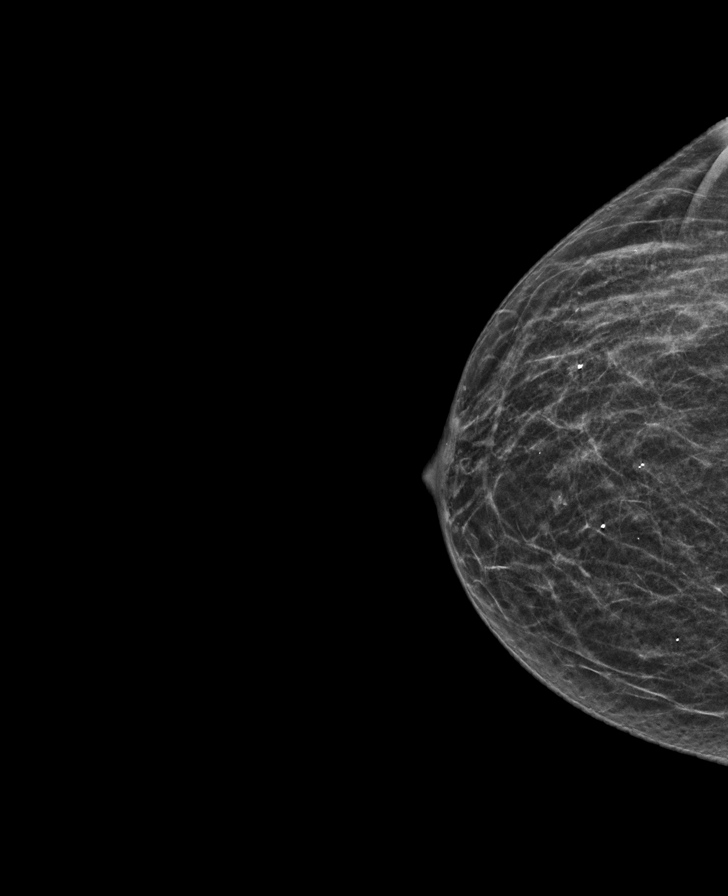

[L CC synth-2D]
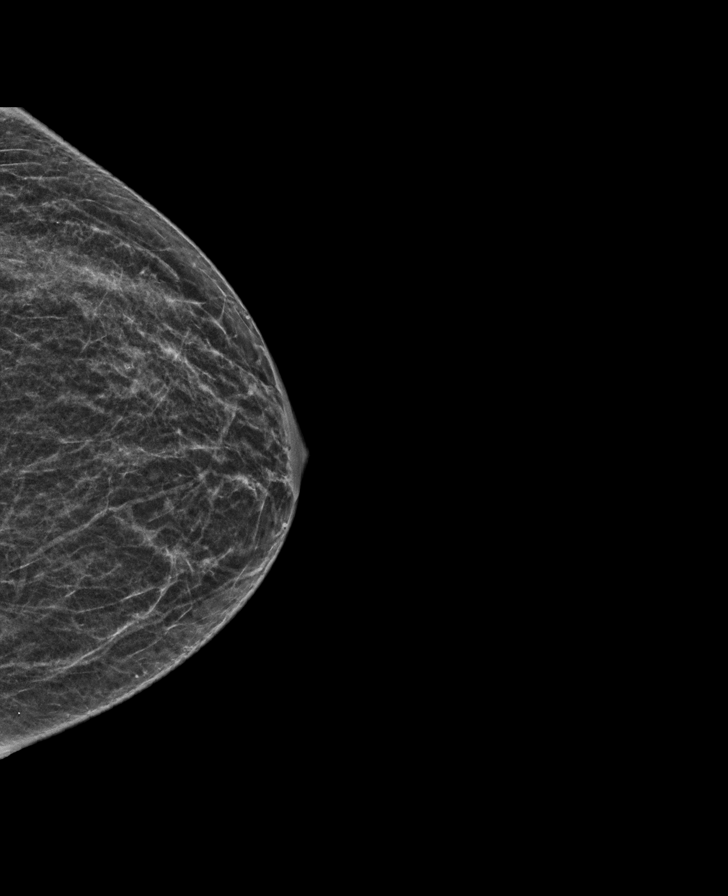

[L MLO synth-2D]
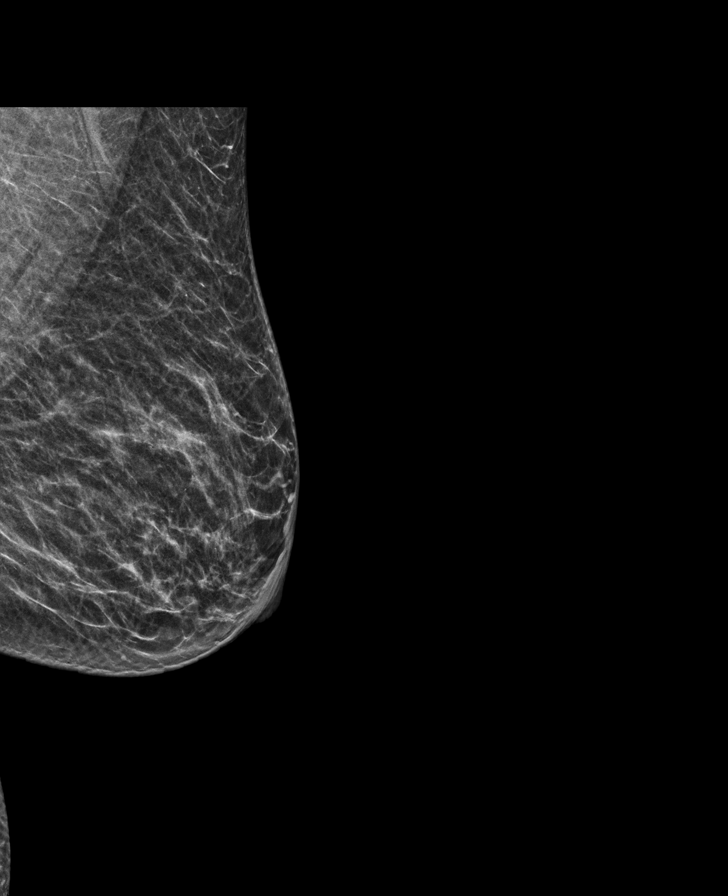

[L CC tomo · tomo slice 23/44.0]
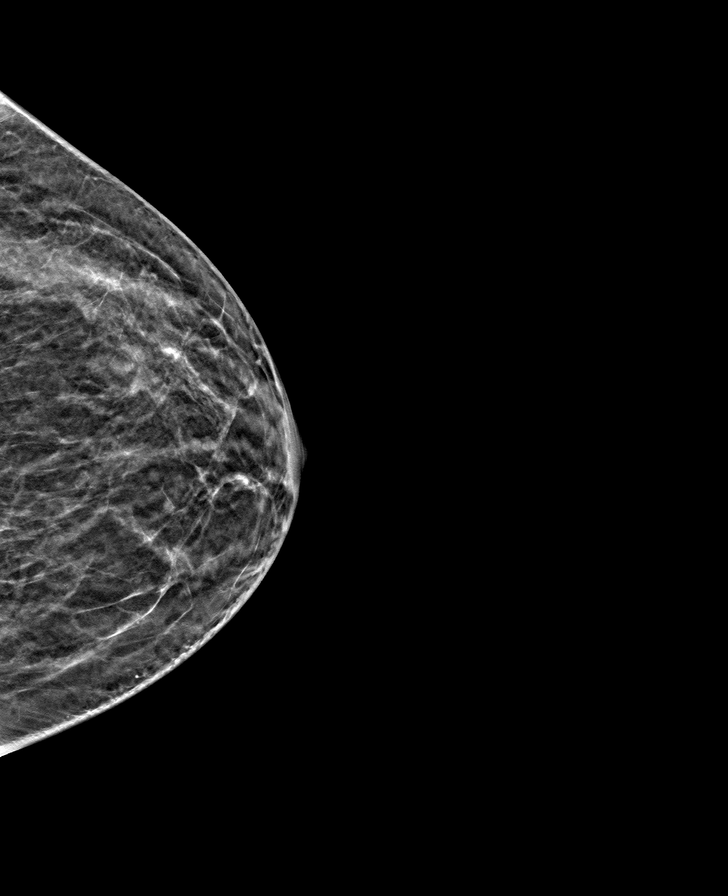

[L MLO tomo · tomo slice 27/54.0]
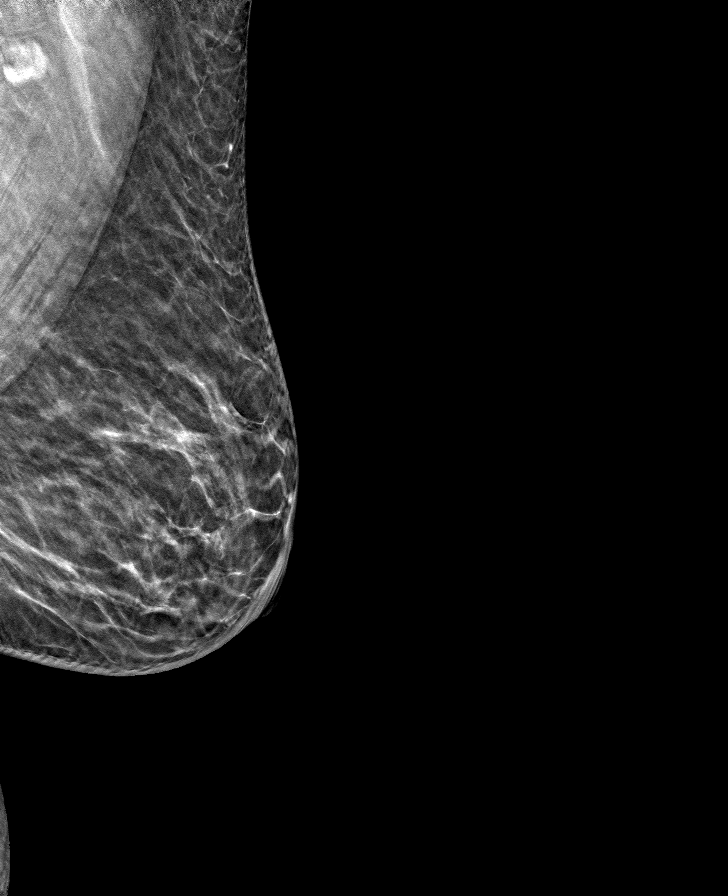

[R MLO tomo · tomo slice 27/54.0]
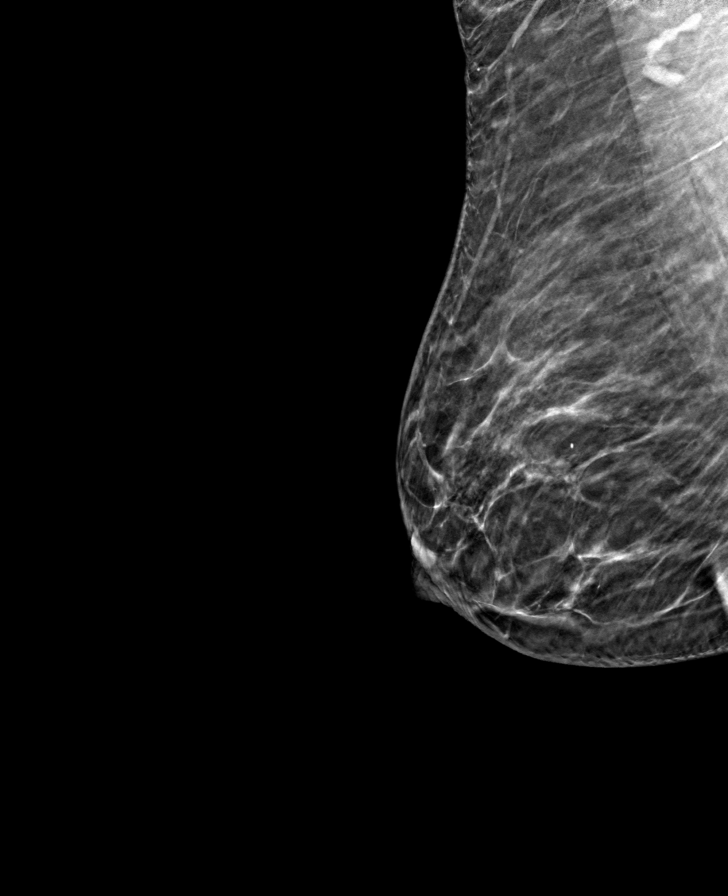

[R CC tomo · tomo slice 23/45.0]
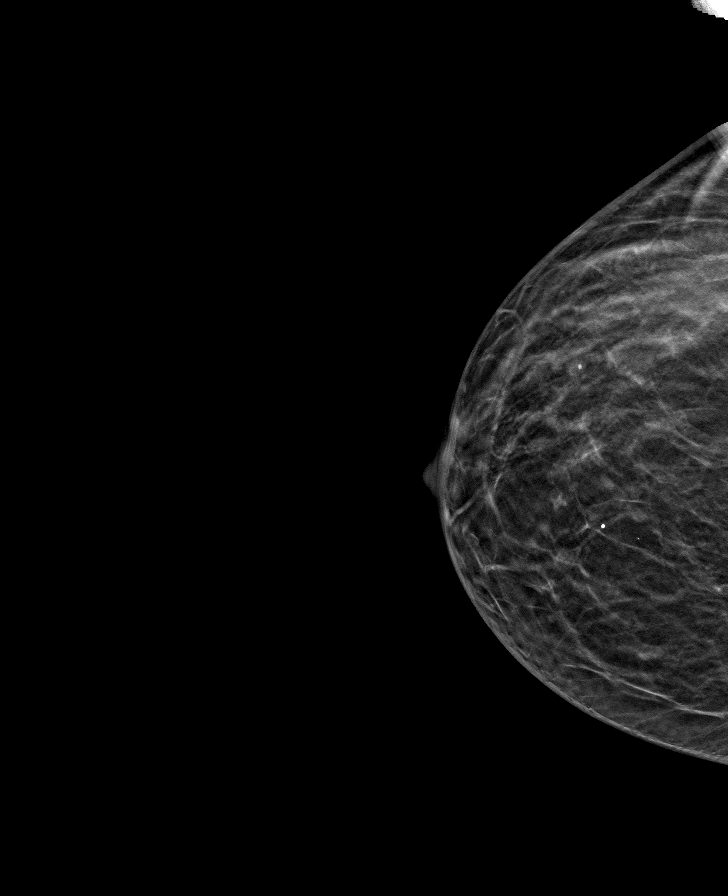

[8 of 24 positions shown; findings below may reference images not displayed]

ACR Breast Density Category b: There are scattered areas of
fibroglandular density.
FINDINGS: There are no findings suspicious for malignancy. Images were
processed with CAD.
IMPRESSION: No mammographic evidence of malignancy. A result letter of this
screening mammogram will be mailed directly to the patient.

RECOMMENDATION:
Screening mammogram in one year. (Code:[TQ])

BI-RADS CATEGORY  1: Negative.

## 2018-10-08 ENCOUNTER — Ambulatory Visit (INDEPENDENT_AMBULATORY_CARE_PROVIDER_SITE_OTHER): Payer: Medicare Other | Admitting: Sports Medicine

## 2018-10-08 ENCOUNTER — Encounter: Payer: Self-pay | Admitting: Sports Medicine

## 2018-10-08 ENCOUNTER — Other Ambulatory Visit: Payer: Self-pay | Admitting: Sports Medicine

## 2018-10-08 ENCOUNTER — Ambulatory Visit (INDEPENDENT_AMBULATORY_CARE_PROVIDER_SITE_OTHER): Payer: Medicare Other

## 2018-10-08 ENCOUNTER — Other Ambulatory Visit: Payer: Self-pay

## 2018-10-08 VITALS — BP 109/69 | HR 93 | Temp 97.2°F | Resp 16

## 2018-10-08 DIAGNOSIS — M7662 Achilles tendinitis, left leg: Secondary | ICD-10-CM

## 2018-10-08 DIAGNOSIS — M79672 Pain in left foot: Secondary | ICD-10-CM

## 2018-10-08 NOTE — Patient Instructions (Addendum)
Achilles Tendinitis  with Rehab Achilles tendinitis is a disorder of the Achilles tendon. The Achilles tendon connects the large calf muscles (Gastrocnemius and Soleus) to the heel bone (calcaneus). This tendon is sometimes called the heel cord. It is important for pushing-off and standing on your toes and is important for walking, running, or jumping. Tendinitis is often caused by overuse and repetitive microtrauma. SYMPTOMS  Pain, tenderness, swelling, warmth, and redness may occur over the Achilles tendon even at rest.  Pain with pushing off, or flexing or extending the ankle.  Pain that is worsened after or during activity. CAUSES   Overuse sometimes seen with rapid increase in exercise programs or in sports requiring running and jumping.  Poor physical conditioning (strength and flexibility or endurance).  Running sports, especially training running down hills.  Inadequate warm-up before practice or play or failure to stretch before participation.  Injury to the tendon. PREVENTION   Warm up and stretch before practice or competition.  Allow time for adequate rest and recovery between practices and competition.  Keep up conditioning.  Keep up ankle and leg flexibility.  Improve or keep muscle strength and endurance.  Improve cardiovascular fitness.  Use proper technique.  Use proper equipment (shoes, skates).  To help prevent recurrence, taping, protective strapping, or an adhesive bandage may be recommended for several weeks after healing is complete. PROGNOSIS   Recovery may take weeks to several months to heal.  Longer recovery is expected if symptoms have been prolonged.  Recovery is usually quicker if the inflammation is due to a direct blow as compared with overuse or sudden strain. RELATED COMPLICATIONS   Healing time will be prolonged if the condition is not correctly treated. The injury must be given plenty of time to heal.  Symptoms can reoccur if  activity is resumed too soon.  Untreated, tendinitis may increase the risk of tendon rupture requiring additional time for recovery and possibly surgery. TREATMENT   The first treatment consists of rest anti-inflammatory medication, and ice to relieve the pain.  Stretching and strengthening exercises after resolution of pain will likely help reduce the risk of recurrence. Referral to a physical therapist or athletic trainer for further evaluation and treatment may be helpful.  A walking boot or cast may be recommended to rest the Achilles tendon. This can help break the cycle of inflammation and microtrauma.  Arch supports (orthotics) may be prescribed or recommended by your caregiver as an adjunct to therapy and rest.  Surgery to remove the inflamed tendon lining or degenerated tendon tissue is rarely necessary and has shown less than predictable results. MEDICATION   Nonsteroidal anti-inflammatory medications, such as aspirin and ibuprofen, may be used for pain and inflammation relief. Do not take within 7 days before surgery. Take these as directed by your caregiver. Contact your caregiver immediately if any bleeding, stomach upset, or signs of allergic reaction occur. Other minor pain relievers, such as acetaminophen, may also be used.  Pain relievers may be prescribed as necessary by your caregiver. Do not take prescription pain medication for longer than 4 to 7 days. Use only as directed and only as much as you need.  Cortisone injections are rarely indicated. Cortisone injections may weaken tendons and predispose to rupture. It is better to give the condition more time to heal than to use them. HEAT AND COLD  Cold is used to relieve pain and reduce inflammation for acute and chronic Achilles tendinitis. Cold should be applied for 10 to 15 minutes   every 2 to 3 hours for inflammation and pain and immediately after any activity that aggravates your symptoms. Use ice packs or an ice  massage.  Heat may be used before performing stretching and strengthening activities prescribed by your caregiver. Use a heat pack or a warm soak. SEEK MEDICAL CARE IF:  Symptoms get worse or do not improve in 2 weeks despite treatment.  New, unexplained symptoms develop. Drugs used in treatment may produce side effects.  EXERCISES:  RANGE OF MOTION (ROM) AND STRETCHING EXERCISES - Achilles Tendinitis  These exercises may help you when beginning to rehabilitate your injury. Your symptoms may resolve with or without further involvement from your physician, physical therapist or athletic trainer. While completing these exercises, remember:   Restoring tissue flexibility helps normal motion to return to the joints. This allows healthier, less painful movement and activity.  An effective stretch should be held for at least 30 seconds.  A stretch should never be painful. You should only feel a gentle lengthening or release in the stretched tissue.  STRETCH  Gastroc, Standing   Place hands on wall.  Extend right / left leg, keeping the front knee somewhat bent.  Slightly point your toes inward on your back foot.  Keeping your right / left heel on the floor and your knee straight, shift your weight toward the wall, not allowing your back to arch.  You should feel a gentle stretch in the right / left calf. Hold this position for 10 seconds. Repeat 3 times. Complete this stretch 2 times per day.  STRETCH  Soleus, Standing   Place hands on wall.  Extend right / left leg, keeping the other knee somewhat bent.  Slightly point your toes inward on your back foot.  Keep your right / left heel on the floor, bend your back knee, and slightly shift your weight over the back leg so that you feel a gentle stretch deep in your back calf.  Hold this position for 10 seconds. Repeat 3 times. Complete this stretch 2 times per day.  STRETCH  Gastrocsoleus, Standing  Note: This exercise can place  a lot of stress on your foot and ankle. Please complete this exercise only if specifically instructed by your caregiver.   Place the ball of your right / left foot on a step, keeping your other foot firmly on the same step.  Hold on to the wall or a rail for balance.  Slowly lift your other foot, allowing your body weight to press your heel down over the edge of the step.  You should feel a stretch in your right / left calf.  Hold this position for 10 seconds.  Repeat this exercise with a slight bend in your knee. Repeat 3 times. Complete this stretch 2 times per day.   STRENGTHENING EXERCISES - Achilles Tendinitis These exercises may help you when beginning to rehabilitate your injury. They may resolve your symptoms with or without further involvement from your physician, physical therapist or athletic trainer. While completing these exercises, remember:   Muscles can gain both the endurance and the strength needed for everyday activities through controlled exercises.  Complete these exercises as instructed by your physician, physical therapist or athletic trainer. Progress the resistance and repetitions only as guided.  You may experience muscle soreness or fatigue, but the pain or discomfort you are trying to eliminate should never worsen during these exercises. If this pain does worsen, stop and make certain you are following the directions exactly. If   the pain is still present after adjustments, discontinue the exercise until you can discuss the trouble with your clinician.  STRENGTH - Plantar-flexors   Sit with your right / left leg extended. Holding onto both ends of a rubber exercise band/tubing, loop it around the ball of your foot. Keep a slight tension in the band.  Slowly push your toes away from you, pointing them downward.  Hold this position for 10 seconds. Return slowly, controlling the tension in the band/tubing. Repeat 3 times. Complete this exercise 2 times per day.    STRENGTH - Plantar-flexors   Stand with your feet shoulder width apart. Steady yourself with a wall or table using as little support as needed.  Keeping your weight evenly spread over the width of your feet, rise up on your toes.*  Hold this position for 10 seconds. Repeat 3 times. Complete this exercise 2 times per day.  *If this is too easy, shift your weight toward your right / left leg until you feel challenged. Ultimately, you may be asked to do this exercise with your right / left foot only.  STRENGTH  Plantar-flexors, Eccentric  Note: This exercise can place a lot of stress on your foot and ankle. Please complete this exercise only if specifically instructed by your caregiver.   Place the balls of your feet on a step. With your hands, use only enough support from a wall or rail to keep your balance.  Keep your knees straight and rise up on your toes.  Slowly shift your weight entirely to your right / left toes and pick up your opposite foot. Gently and with controlled movement, lower your weight through your right / left foot so that your heel drops below the level of the step. You will feel a slight stretch in the back of your calf at the end position.  Use the healthy leg to help rise up onto the balls of both feet, then lower weight only on the right / left leg again. Build up to 15 repetitions. Then progress to 3 consecutive sets of 15 repetitions.*  After completing the above exercise, complete the same exercise with a slight knee bend (about 30 degrees). Again, build up to 15 repetitions. Then progress to 3 consecutive sets of 15 repetitions.* Perform this exercise 2 times per day.  *When you easily complete 3 sets of 15, your physician, physical therapist or athletic trainer may advise you to add resistance by wearing a backpack filled with additional weight.  STRENGTH - Plantar Flexors, Seated   Sit on a chair that allows your feet to rest flat on the ground. If  necessary, sit at the edge of the chair.  Keeping your toes firmly on the ground, lift your right / left heel as far as you can without increasing any discomfort in your ankle. Repeat 3 times. Complete this exercise 2 times a day.  For instructions on how to put on your Night Splint, please visit [email or web address]

## 2018-10-08 NOTE — Progress Notes (Signed)
Subjective: Caroline Blair is a 73 y.o. female patient who presents to office for evaluation of Left heel pain. Patient complains of progressive pain especially over the last 2 months in the Left foot at the Achilles. Ranks pain 4-5/10 and is now interferring with daily activities. Patient has tried not tried any treatments for relief of symptoms.  Reports that when she stretches her foot and ankle she can feel the pulling at the back of her heel which causes some pain.  Patient denies any other pedal complaints.   Patient is diabetic and reports that her last blood sugar was 96 this morning last A1c 6.5 per patient report.  Patient Active Problem List   Diagnosis Date Noted  . Nuclear sclerosis of both eyes 11/23/2015  . Pseudophakia of both eyes 11/23/2015  . Hypertensive retinopathy of both eyes 11/17/2014  . Nuclear cataract of both eyes 04/10/2014  . Hypertension   . Heart murmur   . Diabetes ([place])   . 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 Proliferative diabetic retinopathy ([place]) 02/24/2011    Current Outpatient Medications on File Prior to Visit  Medication Sig Dispense Refill  . Acetaminophen (TYLENOL EXTRA STRENGTH PO) Take 2 tablets by mouth as needed. Pt takes as needed for arthritis pain    . aspirin 81 MG tablet Take 81 mg by mouth daily.    Marland Kitchen atorvastatin (LIPITOR) 40 MG tablet Take 40 mg by mouth daily.    . Cyanocobalamin (VITAMIN B 12 PO) Take by mouth daily.    Marland Kitchen glipiZIDE (GLUCOTROL XL) 5 MG 24 hr tablet Take by mouth.    . hydrochlorothiazide (HYDRODIURIL) 25 MG tablet Take 25 mg by mouth daily.    Marland Kitchen losartan (COZAAR) 100 MG tablet Take 100 mg by mouth daily.    Marland Kitchen losartan-hydrochlorothiazide (HYZAAR) 100-25 MG per tablet Take 1 tablet by mouth daily.    . metFORMIN (GLUCOPHAGE) 1000 MG tablet Take 1,000 mg by mouth 2 (two) times daily with a meal.    . Multiple Vitamin (MULTIVITAMIN PO) Take by mouth. Pt takes One a Day Women's Multivitamin     No  current facility-administered medications on file prior to visit.     No Known Allergies  Objective:  General: Alert and oriented x3 in no acute distress  Dermatology: No open lesions bilateral lower extremities, no webspace macerations, no ecchymosis bilateral, all nails x 10 are well manicured.  Vascular: Dorsalis Pedis and Posterior Tibial pedal pulses 1/4, Capillary Fill Time 3 seconds, + pedal hair growth bilateral, no edema bilateral lower extremities, Temperature gradient within normal limits.  Neurology: Johney Maine sensation intact via light touch bilateral.  Musculoskeletal: Mild tenderness with palpation at insertion of the Achilles on Left, there is calcaneal exostosis with mild soft tissue present and decreased ankle rom with knee extending  vs flexed resembling gastroc equnius bilateral, The achilles tendon feels intact with no nodularity or palpable dell, Thompson sign negative on left.  Gait: Antalgic gait with increased heel off left.   Xrays that  Left Foot    Impression: Normal osseous mineralization. Joint spaces preserved. No fracture/dislocation/boney destruction. Calcaneal spur present. Kager's triangle intact with no obliteration. No soft tissue abnormalities or radiopaque foreign bodies.   Assessment and Plan: Problem List Items Addressed This Visit    None    Visit Diagnoses    Left foot pain    -  Primary   Achilles tendinitis, left leg         -Complete  examination performed -Xrays reviewed -Discussed treatement options -Rx night splint, heel lifts, gentle stretching -Advised over-the-counter pain cream Epson salt soaks as needed and over-the-counter anti-inflammatories -No improvement will consider MRI/PT/EPAT -Patient to return to office in 1 month or sooner if condition worsens.  Landis Martins, DPM

## 2018-10-29 DIAGNOSIS — E113593 Type 2 diabetes mellitus with proliferative diabetic retinopathy without macular edema, bilateral: Secondary | ICD-10-CM | POA: Diagnosis not present

## 2018-10-29 DIAGNOSIS — H35033 Hypertensive retinopathy, bilateral: Secondary | ICD-10-CM | POA: Diagnosis not present

## 2018-10-29 DIAGNOSIS — Z7984 Long term (current) use of oral hypoglycemic drugs: Secondary | ICD-10-CM | POA: Diagnosis not present

## 2018-10-29 DIAGNOSIS — H35371 Puckering of macula, right eye: Secondary | ICD-10-CM | POA: Diagnosis not present

## 2018-10-29 DIAGNOSIS — Z961 Presence of intraocular lens: Secondary | ICD-10-CM | POA: Diagnosis not present

## 2018-11-05 ENCOUNTER — Ambulatory Visit: Payer: Medicare Other | Admitting: Sports Medicine

## 2019-01-01 DIAGNOSIS — D509 Iron deficiency anemia, unspecified: Secondary | ICD-10-CM | POA: Diagnosis not present

## 2019-01-01 DIAGNOSIS — N183 Chronic kidney disease, stage 3 unspecified: Secondary | ICD-10-CM | POA: Diagnosis not present

## 2019-01-01 DIAGNOSIS — Z7984 Long term (current) use of oral hypoglycemic drugs: Secondary | ICD-10-CM | POA: Diagnosis not present

## 2019-01-01 DIAGNOSIS — Z23 Encounter for immunization: Secondary | ICD-10-CM | POA: Diagnosis not present

## 2019-01-01 DIAGNOSIS — E113393 Type 2 diabetes mellitus with moderate nonproliferative diabetic retinopathy without macular edema, bilateral: Secondary | ICD-10-CM | POA: Diagnosis not present

## 2019-01-01 DIAGNOSIS — E785 Hyperlipidemia, unspecified: Secondary | ICD-10-CM | POA: Diagnosis not present

## 2019-01-01 DIAGNOSIS — Z0000 Encounter for general adult medical examination without abnormal findings: Secondary | ICD-10-CM | POA: Diagnosis not present

## 2019-01-01 DIAGNOSIS — I10 Essential (primary) hypertension: Secondary | ICD-10-CM | POA: Diagnosis not present

## 2019-03-15 ENCOUNTER — Ambulatory Visit: Payer: Medicare Other | Attending: Internal Medicine

## 2019-03-15 DIAGNOSIS — Z23 Encounter for immunization: Secondary | ICD-10-CM

## 2019-03-15 NOTE — Progress Notes (Signed)
   Covid-19 Vaccination Clinic  Name:  Caroline Blair    MRN: [id number] DOB: Apr 27, 1945  03/15/2019  [name] was observed post Covid-19 immunization for 15 minutes without incidence. She was provided with Vaccine Information Sheet and instruction to access the V-Safe system.   [name] was instructed to call 911 with any severe reactions post vaccine: Marland Kitchen Difficulty breathing  . Swelling of your face and throat  . A fast heartbeat  . A bad rash all over your body  . Dizziness and weakness    Immunizations Administered    Name Date Dose VIS Date Route   Pfizer COVID-19 Vaccine 03/15/2019 11:34 AM 0.3 mL 01/31/2019 Intramuscular   Manufacturer: [place]   Lot: [id number]   [place]: [id number]

## 2019-04-05 ENCOUNTER — Ambulatory Visit: Payer: Medicare Other | Attending: Internal Medicine

## 2019-04-05 DIAGNOSIS — Z23 Encounter for immunization: Secondary | ICD-10-CM | POA: Insufficient documentation

## 2019-04-05 NOTE — Progress Notes (Signed)
   Covid-19 Vaccination Clinic  Name:  JAMMI DACRUZ    MRN: [id number] DOB: July 27, 1945  04/05/2019  Ms. Muha was observed post Covid-19 immunization for 15 minutes without incidence. She was provided with Vaccine Information Sheet and instruction to access the V-Safe system.   Ms. Isgett was instructed to call 911 with any severe reactions post vaccine: Marland Kitchen Difficulty breathing  . Swelling of your face and throat  . A fast heartbeat  . A bad rash all over your body  . Dizziness and weakness    Immunizations Administered    Name Date Dose VIS Date Route   Pfizer COVID-19 Vaccine 04/05/2019  9:43 AM 0.3 mL 01/31/2019 Intramuscular   Manufacturer: [place]   Lot: [id number]   [place]: [id number]

## 2019-04-24 DIAGNOSIS — Z79899 Other long term (current) drug therapy: Secondary | ICD-10-CM | POA: Diagnosis not present

## 2019-04-24 DIAGNOSIS — C541 Malignant neoplasm of endometrium: Secondary | ICD-10-CM | POA: Diagnosis not present

## 2019-04-24 DIAGNOSIS — Z6827 Body mass index (BMI) 27.0-27.9, adult: Secondary | ICD-10-CM | POA: Diagnosis not present

## 2019-05-01 DIAGNOSIS — M546 Pain in thoracic spine: Secondary | ICD-10-CM | POA: Diagnosis not present

## 2019-05-01 DIAGNOSIS — M25512 Pain in left shoulder: Secondary | ICD-10-CM | POA: Diagnosis not present

## 2019-05-01 DIAGNOSIS — M5030 Other cervical disc degeneration, unspecified cervical region: Secondary | ICD-10-CM | POA: Diagnosis not present

## 2019-06-23 ENCOUNTER — Other Ambulatory Visit: Payer: Self-pay | Admitting: Family Medicine

## 2019-06-23 DIAGNOSIS — Z1231 Encounter for screening mammogram for malignant neoplasm of breast: Secondary | ICD-10-CM

## 2019-08-06 ENCOUNTER — Ambulatory Visit
Admission: RE | Admit: 2019-08-06 | Discharge: 2019-08-06 | Disposition: A | Discharge status: Home / Self Care | Payer: Medicare Other | Source: Ambulatory Visit | Attending: Family Medicine | Admitting: Family Medicine

## 2019-08-06 ENCOUNTER — Other Ambulatory Visit: Payer: Self-pay

## 2019-08-06 DIAGNOSIS — Z1231 Encounter for screening mammogram for malignant neoplasm of breast: Secondary | ICD-10-CM | POA: Diagnosis not present

## 2019-08-06 IMAGING — MG DIGITAL SCREENING BILAT W/ TOMO W/ CAD
6 of 10 series · 6 of 30 positions shown · non-contrast
Comparison: Previous exam(s).

CLINICAL DATA: Screening.

EXAM:
DIGITAL SCREENING BILATERAL MAMMOGRAM WITH TOMO AND CAD

[R CC synth-2D]
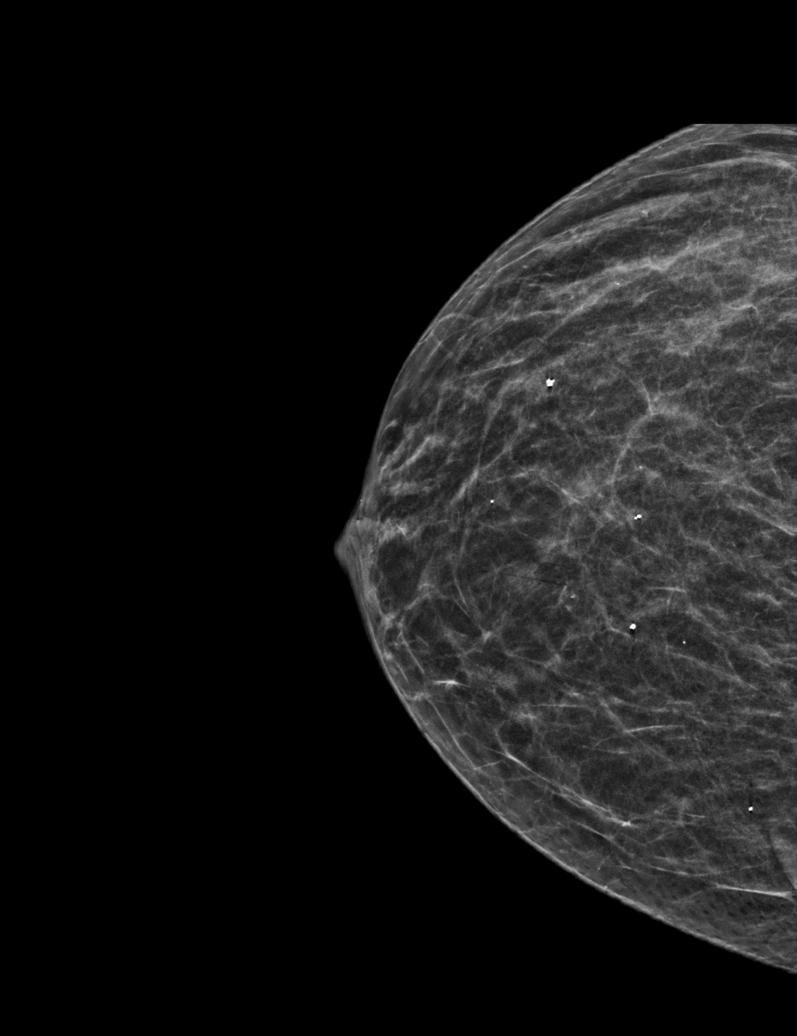

[L MLO synth-2D]
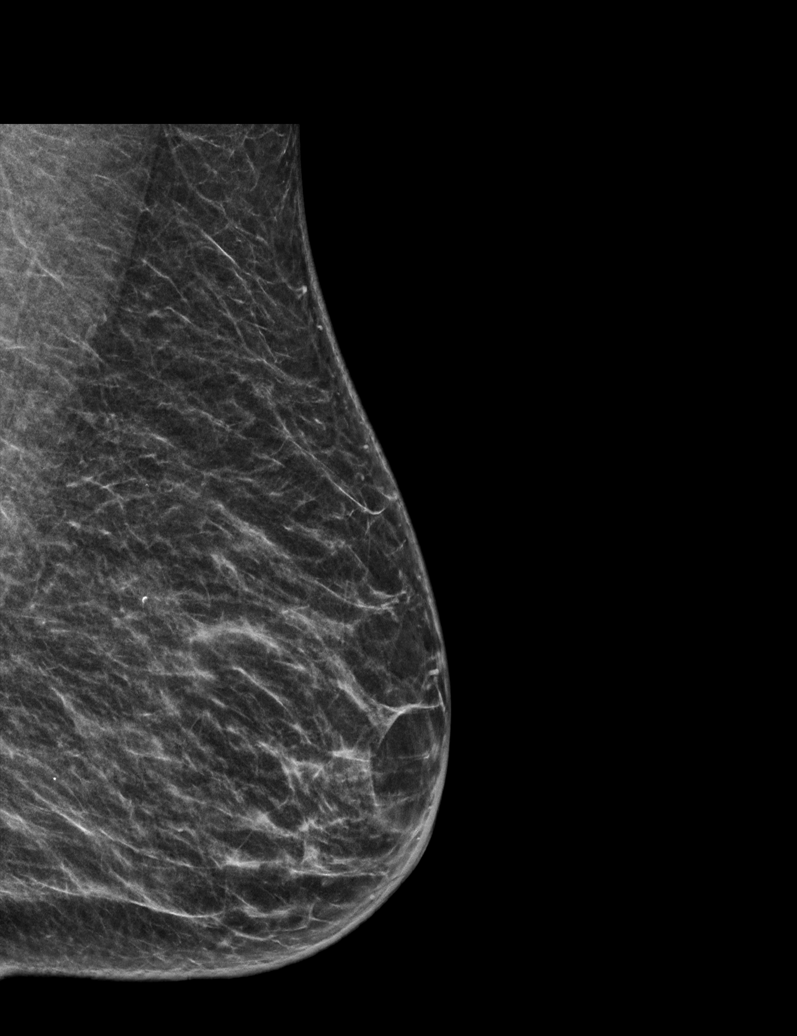

[L CC synth-2D]
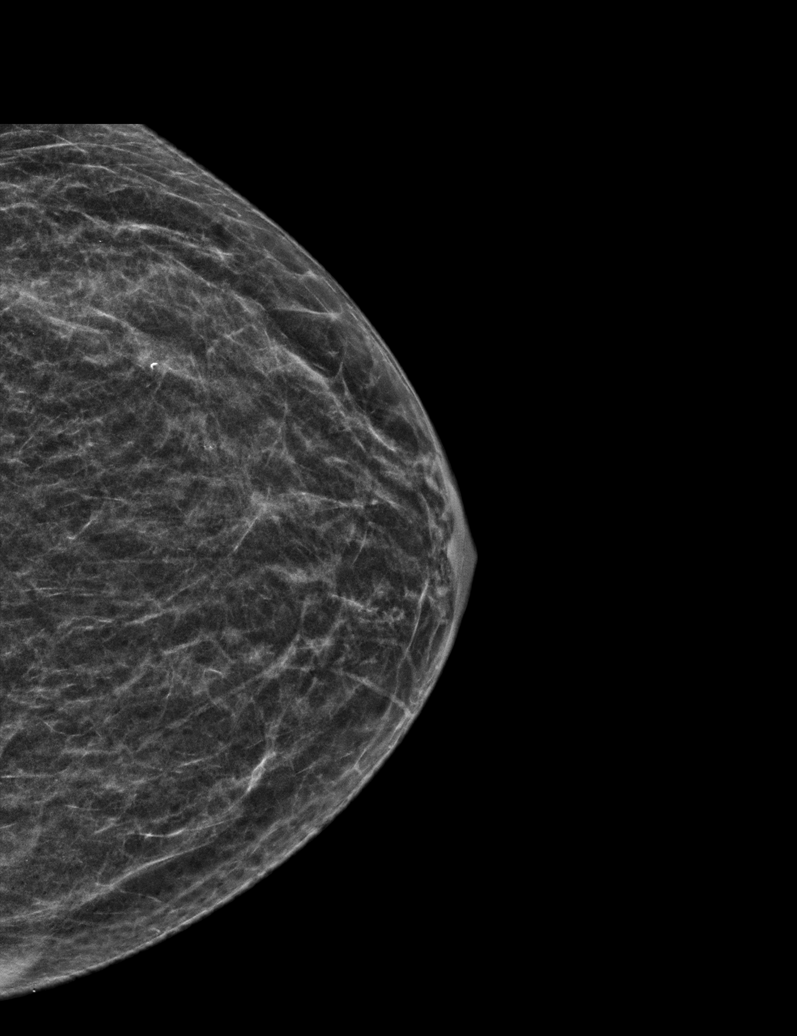

[R MLO synth-2D (1 of 2)]
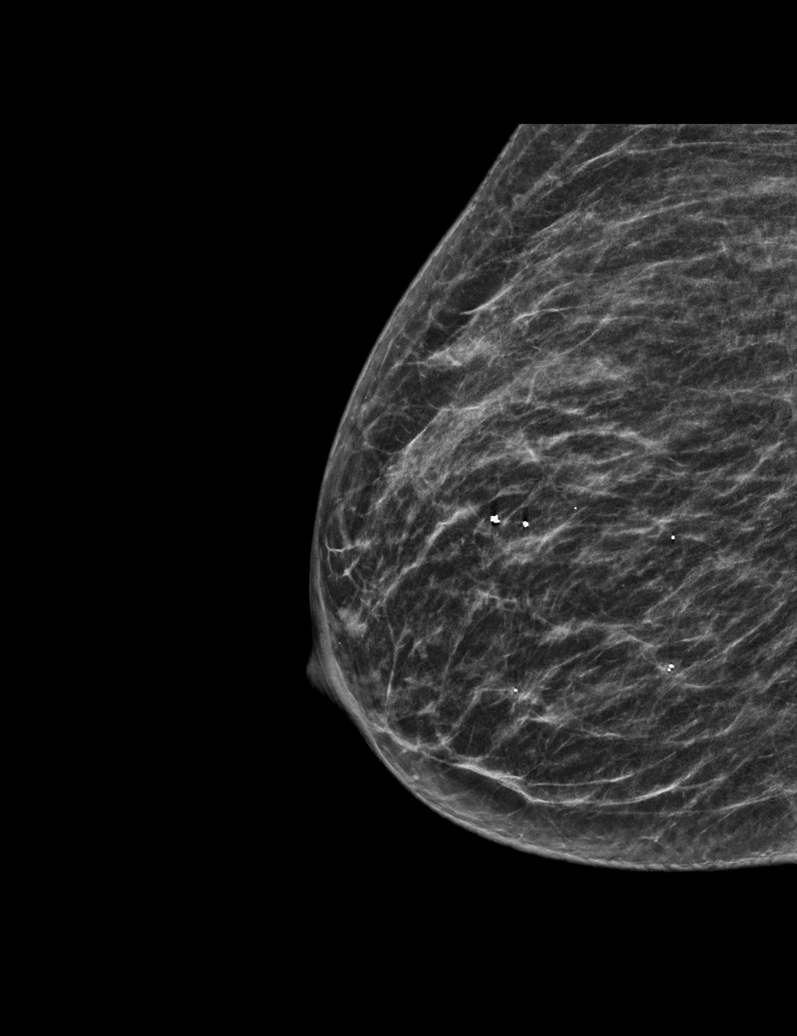

[R MLO synth-2D (2 of 2)]
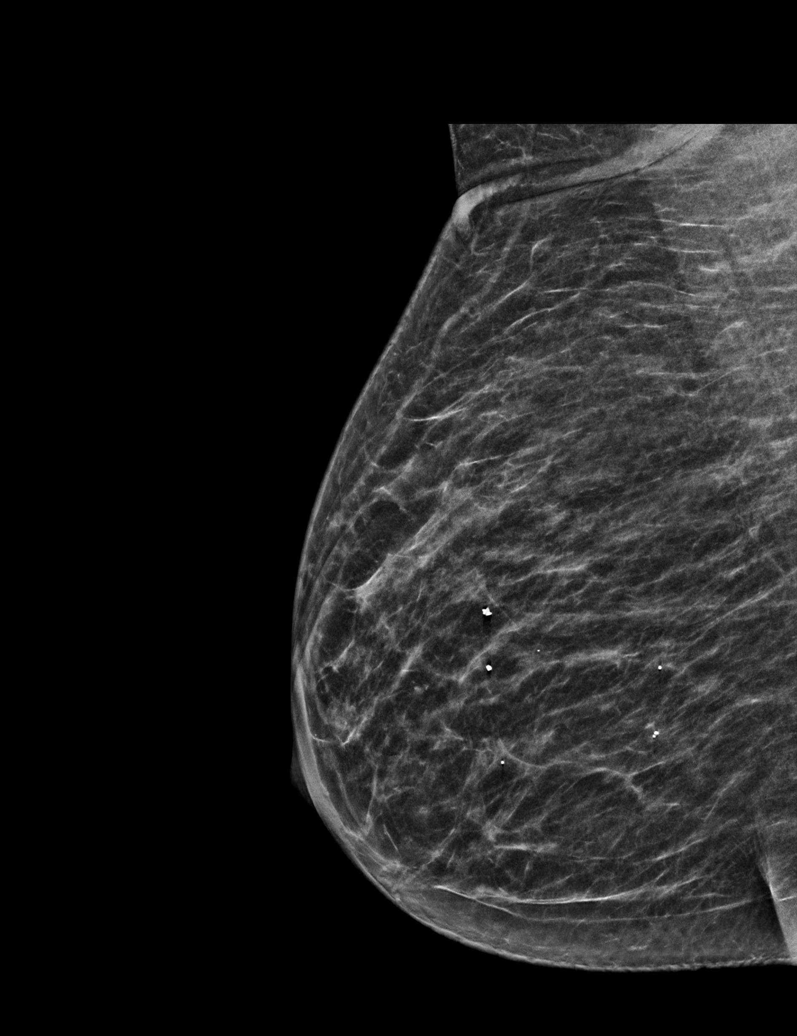

[R MLO tomo · tomo slice 23/45.0]
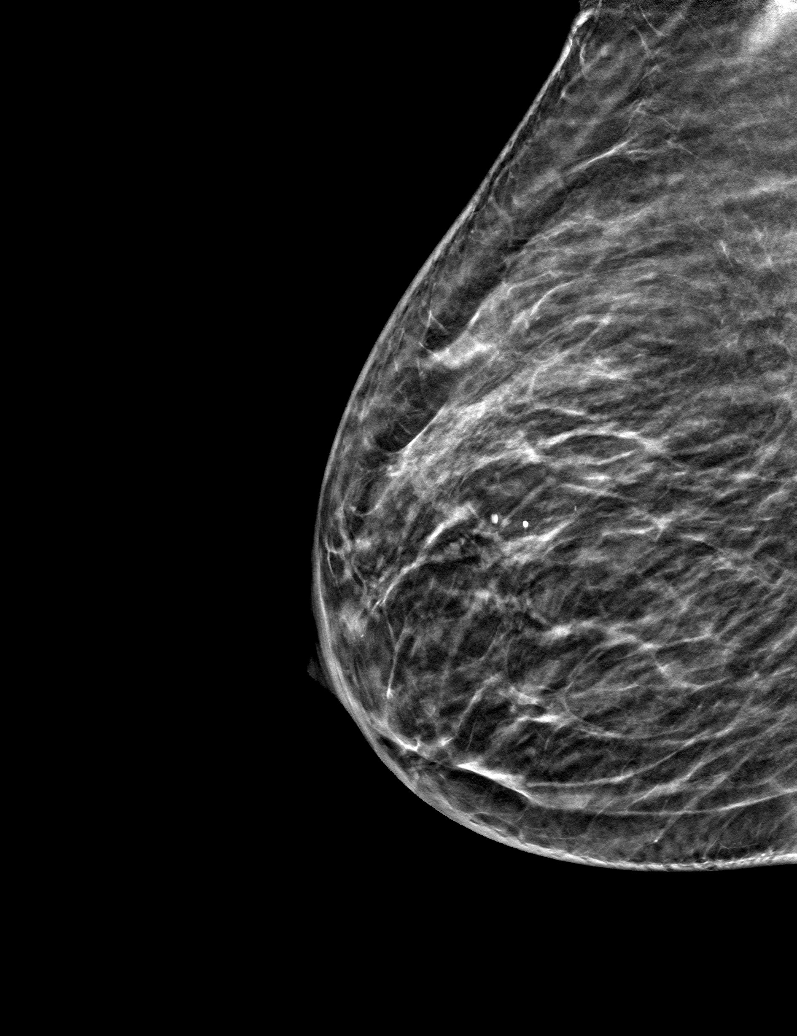

[6 of 30 positions shown; findings below may reference images not displayed]

ACR Breast Density Category b: There are scattered areas of
fibroglandular density.
FINDINGS: There are no findings suspicious for malignancy. Images were
processed with CAD.
IMPRESSION: No mammographic evidence of malignancy. A result letter of this
screening mammogram will be mailed directly to the patient.

RECOMMENDATION:
Screening mammogram in one year. (Code:[TQ])

BI-RADS CATEGORY  1: Negative.

## 2019-11-20 DIAGNOSIS — Z23 Encounter for immunization: Secondary | ICD-10-CM | POA: Diagnosis not present

## 2019-12-19 DIAGNOSIS — Z7984 Long term (current) use of oral hypoglycemic drugs: Secondary | ICD-10-CM | POA: Diagnosis not present

## 2019-12-19 DIAGNOSIS — H35033 Hypertensive retinopathy, bilateral: Secondary | ICD-10-CM | POA: Diagnosis not present

## 2019-12-19 DIAGNOSIS — Z961 Presence of intraocular lens: Secondary | ICD-10-CM | POA: Diagnosis not present

## 2019-12-19 DIAGNOSIS — H35371 Puckering of macula, right eye: Secondary | ICD-10-CM | POA: Diagnosis not present

## 2019-12-19 DIAGNOSIS — E113593 Type 2 diabetes mellitus with proliferative diabetic retinopathy without macular edema, bilateral: Secondary | ICD-10-CM | POA: Diagnosis not present

## 2020-01-13 DIAGNOSIS — E785 Hyperlipidemia, unspecified: Secondary | ICD-10-CM | POA: Diagnosis not present

## 2020-01-13 DIAGNOSIS — E113593 Type 2 diabetes mellitus with proliferative diabetic retinopathy without macular edema, bilateral: Secondary | ICD-10-CM | POA: Diagnosis not present

## 2020-01-13 DIAGNOSIS — N183 Chronic kidney disease, stage 3 unspecified: Secondary | ICD-10-CM | POA: Diagnosis not present

## 2020-01-13 DIAGNOSIS — H35033 Hypertensive retinopathy, bilateral: Secondary | ICD-10-CM | POA: Diagnosis not present

## 2020-01-13 DIAGNOSIS — Z0000 Encounter for general adult medical examination without abnormal findings: Secondary | ICD-10-CM | POA: Diagnosis not present

## 2020-01-13 DIAGNOSIS — I10 Essential (primary) hypertension: Secondary | ICD-10-CM | POA: Diagnosis not present

## 2020-01-13 DIAGNOSIS — Z23 Encounter for immunization: Secondary | ICD-10-CM | POA: Diagnosis not present

## 2020-06-10 DIAGNOSIS — C541 Malignant neoplasm of endometrium: Secondary | ICD-10-CM | POA: Diagnosis not present

## 2020-06-14 DIAGNOSIS — Z23 Encounter for immunization: Secondary | ICD-10-CM | POA: Diagnosis not present

## 2020-06-22 ENCOUNTER — Other Ambulatory Visit: Payer: Self-pay | Admitting: Family Medicine

## 2020-06-22 DIAGNOSIS — Z1231 Encounter for screening mammogram for malignant neoplasm of breast: Secondary | ICD-10-CM

## 2020-07-14 DIAGNOSIS — E785 Hyperlipidemia, unspecified: Secondary | ICD-10-CM | POA: Diagnosis not present

## 2020-07-14 DIAGNOSIS — I10 Essential (primary) hypertension: Secondary | ICD-10-CM | POA: Diagnosis not present

## 2020-07-14 DIAGNOSIS — E1165 Type 2 diabetes mellitus with hyperglycemia: Secondary | ICD-10-CM | POA: Diagnosis not present

## 2020-07-27 ENCOUNTER — Emergency Department (HOSPITAL_COMMUNITY)
Admission: EM | Admit: 2020-07-27 | Discharge: 2020-07-27 | Disposition: A | Discharge status: Home / Self Care | Payer: Medicare Other | Attending: Emergency Medicine | Admitting: Emergency Medicine

## 2020-07-27 ENCOUNTER — Encounter (HOSPITAL_COMMUNITY): Payer: Self-pay | Admitting: Emergency Medicine

## 2020-07-27 ENCOUNTER — Emergency Department (HOSPITAL_COMMUNITY): Payer: Medicare Other

## 2020-07-27 ENCOUNTER — Other Ambulatory Visit: Payer: Self-pay

## 2020-07-27 DIAGNOSIS — Z7982 Long term (current) use of aspirin: Secondary | ICD-10-CM | POA: Diagnosis not present

## 2020-07-27 DIAGNOSIS — Z8544 Personal history of malignant neoplasm of other female genital organs: Secondary | ICD-10-CM | POA: Diagnosis not present

## 2020-07-27 DIAGNOSIS — I10 Essential (primary) hypertension: Secondary | ICD-10-CM | POA: Insufficient documentation

## 2020-07-27 DIAGNOSIS — Z7984 Long term (current) use of oral hypoglycemic drugs: Secondary | ICD-10-CM | POA: Diagnosis not present

## 2020-07-27 DIAGNOSIS — E119 Type 2 diabetes mellitus without complications: Secondary | ICD-10-CM | POA: Insufficient documentation

## 2020-07-27 DIAGNOSIS — Z79899 Other long term (current) drug therapy: Secondary | ICD-10-CM | POA: Diagnosis not present

## 2020-07-27 DIAGNOSIS — R002 Palpitations: Secondary | ICD-10-CM | POA: Insufficient documentation

## 2020-07-27 LAB — CBC WITH DIFFERENTIAL/PLATELET
Abs Immature Granulocytes: 0.04 10*3/uL (ref 0.00–0.07)
Basophils Absolute: 0 10*3/uL (ref 0.0–0.1)
Basophils Relative: 1 %
Eosinophils Absolute: 0.1 10*3/uL (ref 0.0–0.5)
Eosinophils Relative: 2 %
HCT: 35.2 % — ABNORMAL LOW (ref 36.0–46.0)
Hemoglobin: 11.5 g/dL — ABNORMAL LOW (ref 12.0–15.0)
Immature Granulocytes: 1 %
Lymphocytes Relative: 24 %
Lymphs Abs: 1 10*3/uL (ref 0.7–4.0)
MCH: 29.5 pg (ref 26.0–34.0)
MCHC: 32.7 g/dL (ref 30.0–36.0)
MCV: 90.3 fL (ref 80.0–100.0)
Monocytes Absolute: 0.4 10*3/uL (ref 0.1–1.0)
Monocytes Relative: 10 %
Neutro Abs: 2.6 10*3/uL (ref 1.7–7.7)
Neutrophils Relative %: 62 %
Platelets: 235 10*3/uL (ref 150–400)
RBC: 3.9 MIL/uL (ref 3.87–5.11)
RDW: 14 % (ref 11.5–15.5)
WBC: 4.2 10*3/uL (ref 4.0–10.5)
nRBC: 0 % (ref 0.0–0.2)

## 2020-07-27 LAB — MAGNESIUM: Magnesium: 1.5 mg/dL — ABNORMAL LOW (ref 1.7–2.4)

## 2020-07-27 LAB — BASIC METABOLIC PANEL
Anion gap: 10 (ref 5–15)
BUN: 16 mg/dL (ref 8–23)
CO2: 25 mmol/L (ref 22–32)
Calcium: 9.7 mg/dL (ref 8.9–10.3)
Chloride: 106 mmol/L (ref 98–111)
Creatinine, Ser: 1.02 mg/dL — ABNORMAL HIGH (ref 0.44–1.00)
GFR, Estimated: 58 mL/min — ABNORMAL LOW (ref >60)
Glucose, Bld: 148 mg/dL — ABNORMAL HIGH (ref 70–99)
Potassium: 3.6 mmol/L (ref 3.5–5.1)
Sodium: 141 mmol/L (ref 135–145)

## 2020-07-27 LAB — TSH: TSH: 2.693 u[IU]/mL (ref 0.350–4.500)

## 2020-07-27 IMAGING — CR DG CHEST 2V
2 series · 2 of 2 positions shown · non-contrast
Comparison: No recent prior.

CLINICAL DATA: Palpitations.

EXAM:
CHEST - 2 VIEW

[w chest pa]
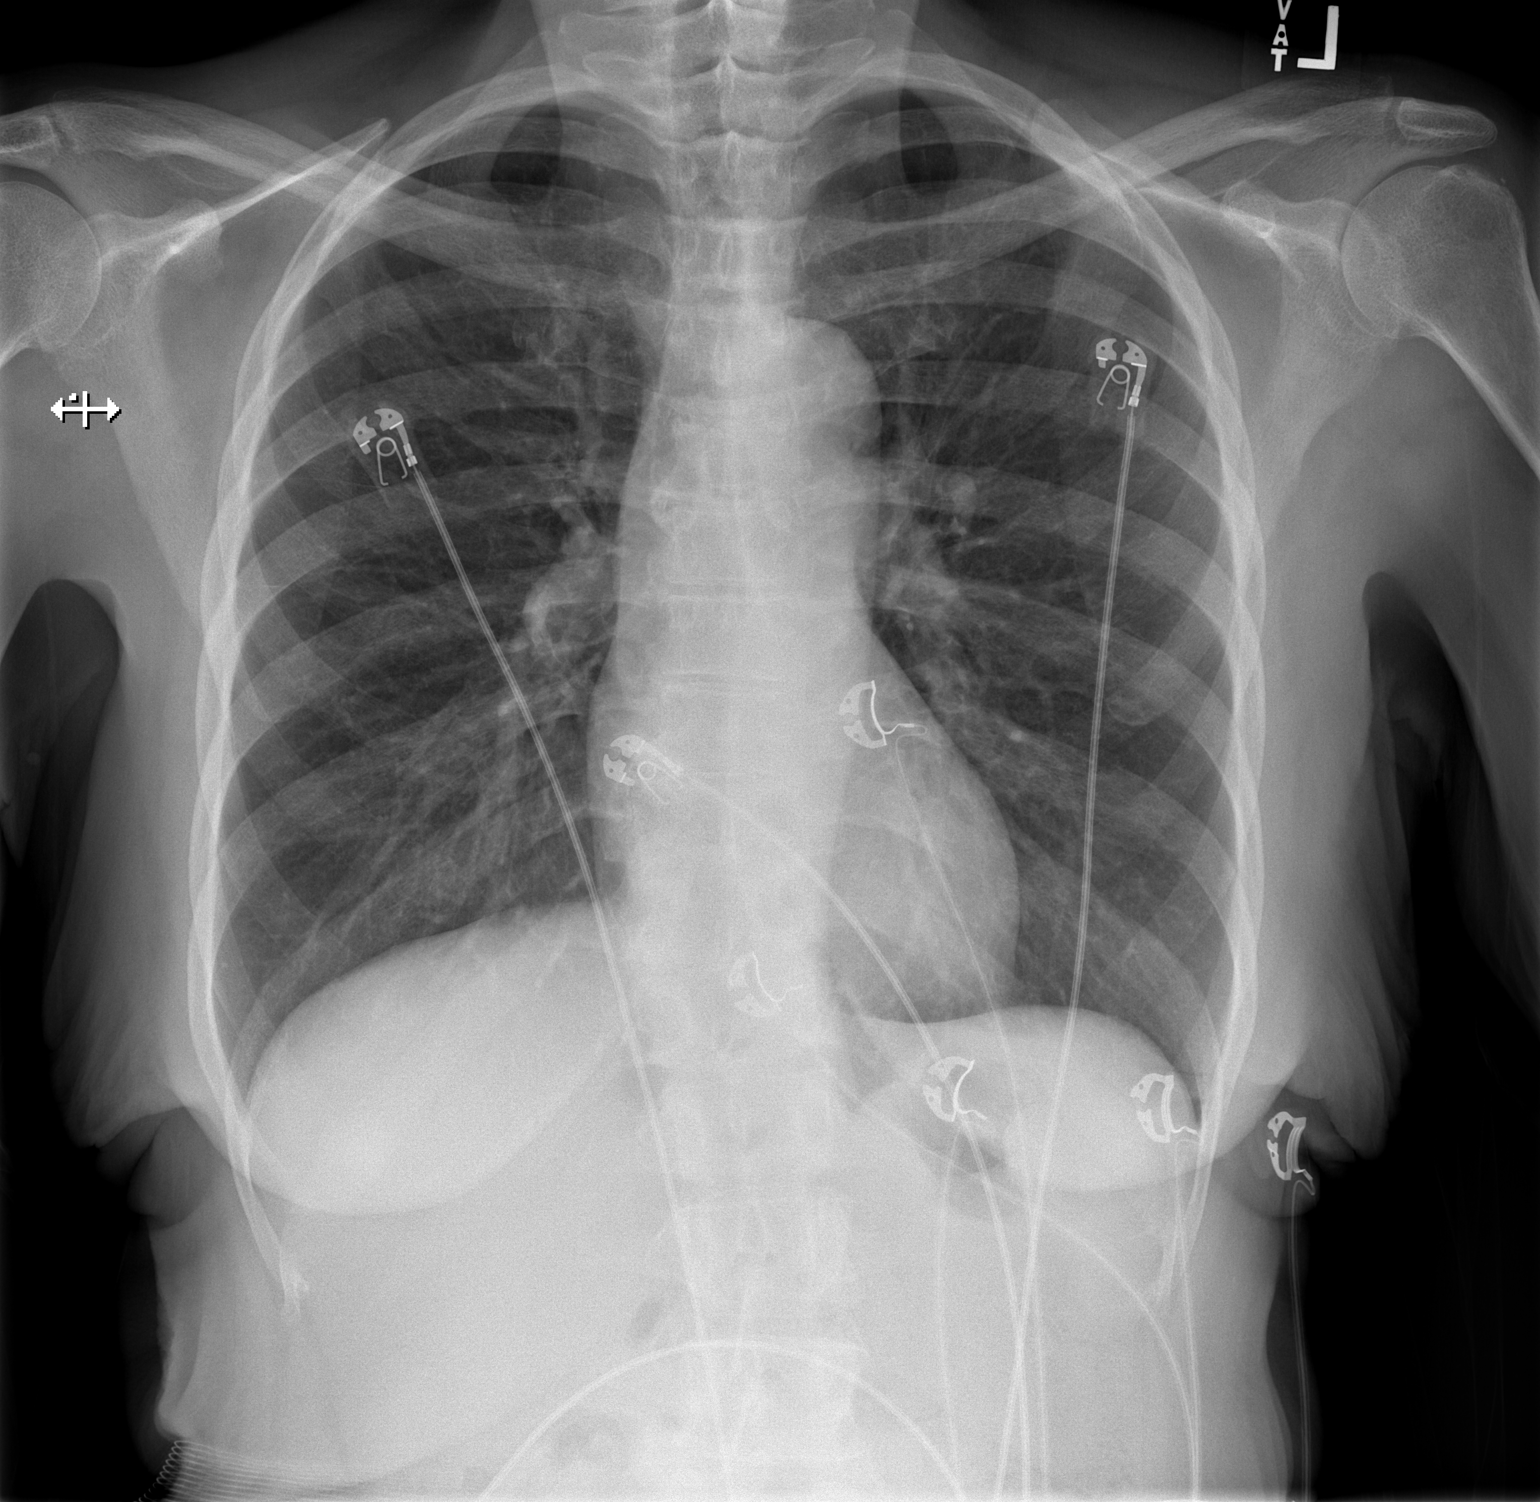

[w chest lat]
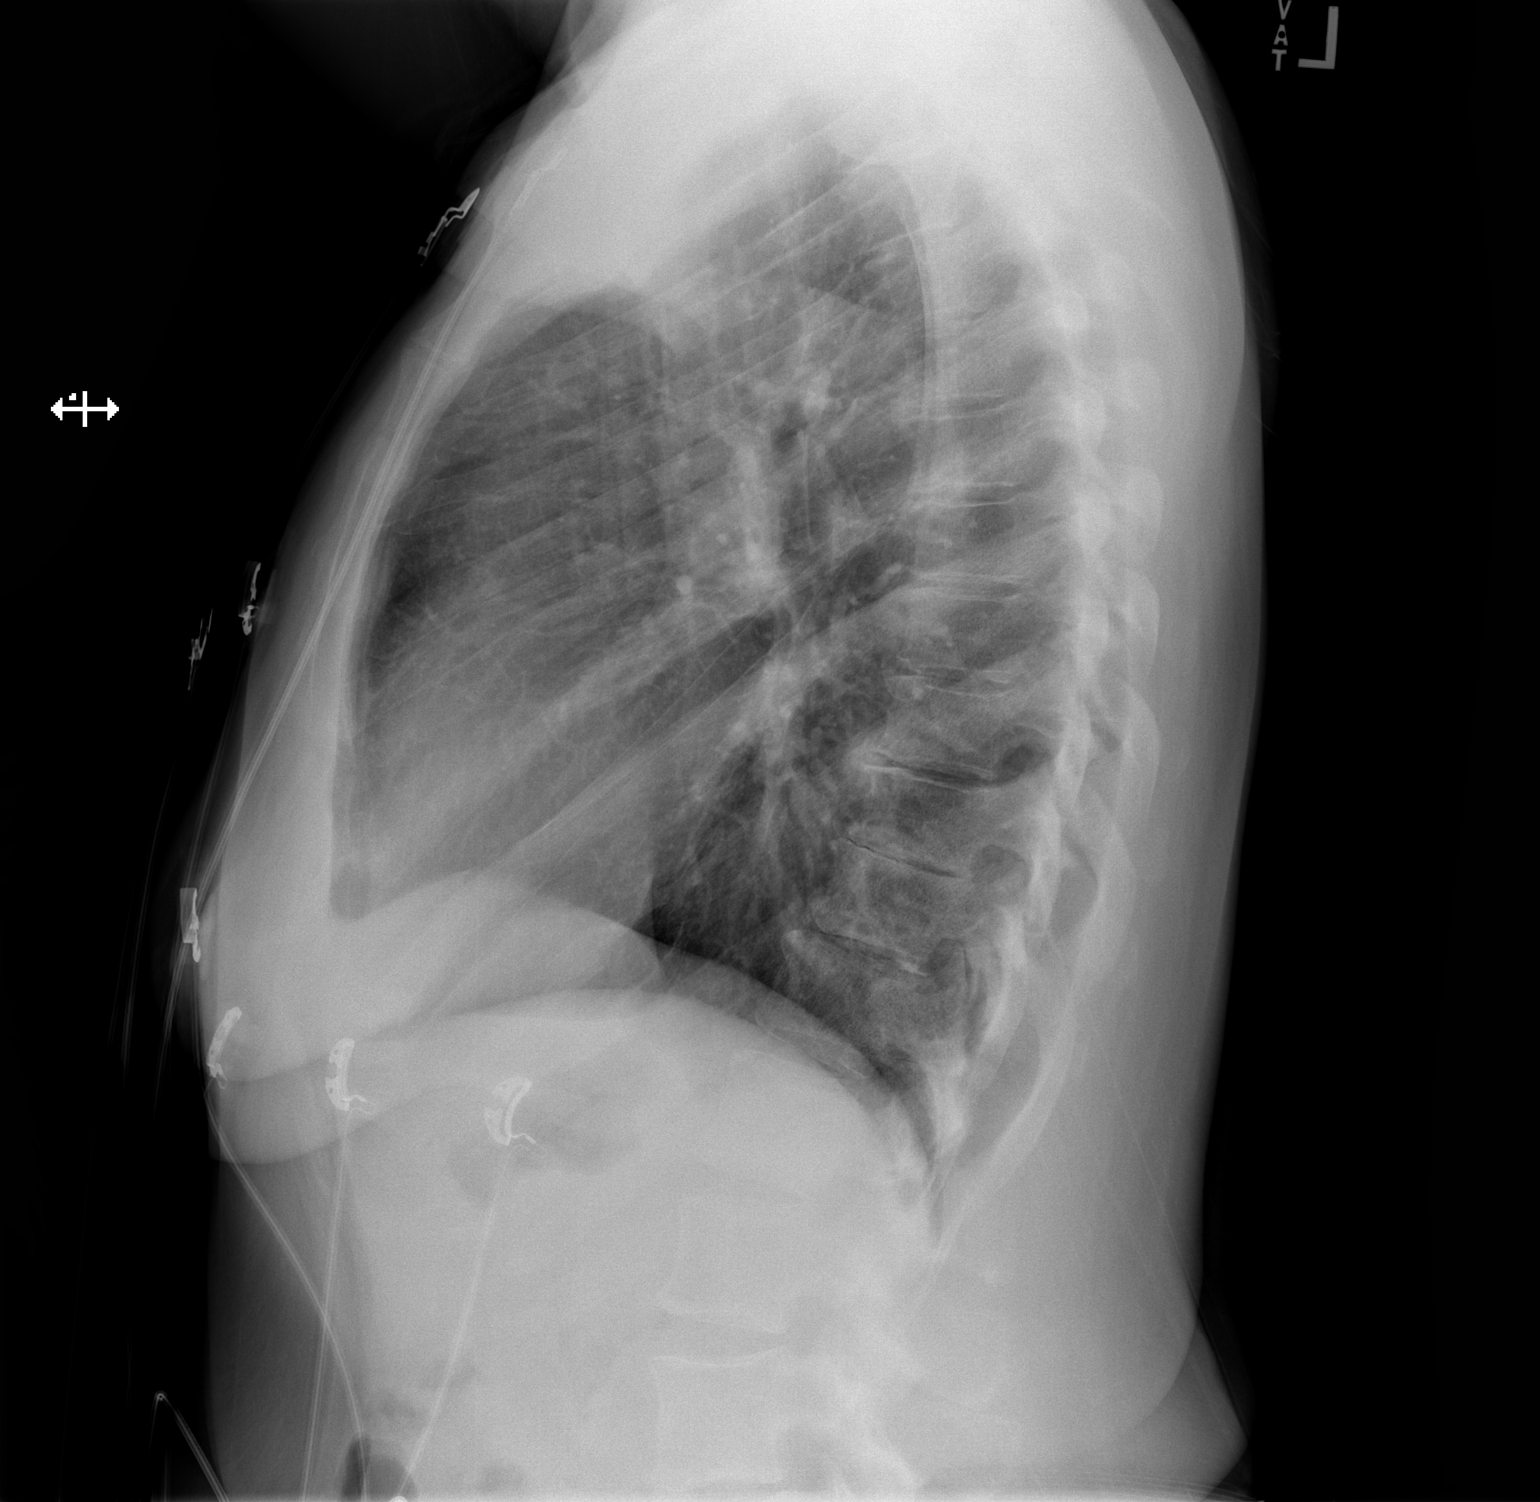

[2 of 2 positions shown; findings below may reference images not displayed]

FINDINGS: Mediastinum and hilar structures normal. Heart size normal. No focal
infiltrate. No pleural effusion or pneumothorax. Mild thoracic spine
scoliosis.
IMPRESSION: No acute cardiopulmonary disease.

## 2020-07-27 MED ORDER — MAGNESIUM GLUCONATE 500 MG PO TABS
500.0000 mg | ORAL_TABLET | Freq: Once | ORAL | Status: AC
  Administered 2020-07-27: 500 mg via ORAL
  Filled 2020-07-27: qty 1

## 2020-07-27 MED ORDER — HYDROXYZINE HCL 25 MG PO TABS: ORAL_TABLET | ORAL | 0 refills | Status: DC

## 2020-07-27 NOTE — ED Notes (Signed)
ED Provider at bedside. 

## 2020-07-27 NOTE — Discharge Instructions (Addendum)
Follow-up with your doctor in a couple weeks to check on your palpitations and your low magnesium

## 2020-07-27 NOTE — ED Triage Notes (Signed)
Per pt, states she starting having palpitations on and off yesterday-states shw went to a walk in clinic but wait was too long-states symptoms resolved-states this am she started having them again-no CP of SOB

## 2020-07-27 NOTE — ED Provider Notes (Signed)
[facility] DEPT Provider Note   CSN: [id number] Arrival date & time: 07/27/20  3151     History Chief Complaint  Patient presents with  . Palpitations    Caroline Blair is a 75 y.o. female.  Patient states she had some palpitations this morning and yesterday morning.  She thinks she is just anxious.  She has not had any other symptoms  The history is provided by the patient. No language interpreter was used.  Palpitations Palpitations quality:  Regular Onset quality:  Sudden Timing:  Rare Progression:  Resolved Chronicity:  New Context: anxiety   Relieved by:  Nothing Associated symptoms: no back pain, no chest pain and no cough        Past Medical History:  Diagnosis Date  . Anemia   . Cataract   . Diabetes ([place])   . Heart murmur   . Hypertension     Patient Active Problem List   Diagnosis Date Noted  . Nuclear sclerosis of both eyes 11/23/2015  . Pseudophakia of both eyes 11/23/2015  . Hypertensive retinopathy of both eyes 11/17/2014  . Nuclear cataract of both eyes 04/10/2014  . Hypertension   . Heart murmur   . Diabetes ([place])   . 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 Proliferative diabetic retinopathy ([place]) 02/24/2011    Past Surgical History:  Procedure Laterality Date  . DILATION AND CURETTAGE OF UTERUS  2006     OB History   No obstetric history on file.     Family History  Problem Relation Age of Onset  . Diabetes Father   . Colon cancer Neg Hx     Social History   Tobacco Use  . Smoking status: Never Smoker  . Smokeless tobacco: Never Used  Substance Use Topics  . Alcohol use: Yes    Comment: rare glass wine  . Drug use: No    Home Medications Prior to Admission medications   Medication Sig Start Date End Date Taking? Authorizing Provider  Acetaminophen (TYLENOL EXTRA STRENGTH PO) Take 2 tablets by mouth as needed. Pt takes as needed for arthritis pain    [provider]  aspirin 81 MG tablet Take 81 mg by mouth daily.    [provider]  atorvastatin (LIPITOR) 40 MG tablet Take 40 mg by mouth daily.    [provider]  Cyanocobalamin (VITAMIN B 12 PO) Take by mouth daily.    [provider]  glipiZIDE (GLUCOTROL XL) 5 MG 24 hr tablet Take by mouth.    [provider]  hydrochlorothiazide (HYDRODIURIL) 25 MG tablet Take 25 mg by mouth daily. 09/19/18   [provider]  losartan (COZAAR) 100 MG tablet Take 100 mg by mouth daily. 09/19/18   [provider]  losartan-hydrochlorothiazide (HYZAAR) 100-25 MG per tablet Take 1 tablet by mouth daily.    [provider]  metFORMIN (GLUCOPHAGE) 1000 MG tablet Take 1,000 mg by mouth 2 (two) times daily with a meal. 09/19/18   [provider]  Multiple Vitamin (MULTIVITAMIN PO) Take by mouth. Pt takes One a Day Women's Multivitamin    [provider]    Allergies    Patient has no known allergies.  Review of Systems   Review of Systems  Constitutional: Negative for appetite change and fatigue.  HENT: Negative for congestion, ear discharge and sinus pressure.   Eyes: Negative for discharge.  Respiratory: Negative for cough.   Cardiovascular: Positive for palpitations. Negative for  chest pain.  Gastrointestinal: Negative for abdominal pain and diarrhea.  Genitourinary: Negative for frequency and hematuria.  Musculoskeletal: Negative for back pain.  Skin: Negative for rash.  Neurological: Negative for seizures and headaches.  Psychiatric/Behavioral: Negative for hallucinations.    Physical Exam Updated Vital Signs BP (!) 159/51 (BP Location: Left Arm)   Pulse 61   Temp (!) 97.5 F (36.4 C) (Oral)   Resp 19   SpO2 100%   Physical Exam Vitals and nursing note reviewed.  Constitutional:      Appearance: She is well-developed.  HENT:     Head: Normocephalic.     Nose: Nose normal.  Eyes:     General: No scleral icterus.     Conjunctiva/sclera: Conjunctivae normal.  Neck:     Thyroid: No thyromegaly.  Cardiovascular:     Rate and Rhythm: Normal rate and regular rhythm.     Heart sounds: No murmur heard. No friction rub. No gallop.   Pulmonary:     Breath sounds: No stridor. No wheezing or rales.  Chest:     Chest wall: No tenderness.  Abdominal:     General: There is no distension.     Tenderness: There is no abdominal tenderness. There is no rebound.  Musculoskeletal:        General: Normal range of motion.     Cervical back: Neck supple.  Lymphadenopathy:     Cervical: No cervical adenopathy.  Skin:    Findings: No erythema or rash.  Neurological:     Mental Status: She is alert and oriented to person, place, and time.     Motor: No abnormal muscle tone.     Coordination: Coordination normal.  Psychiatric:        Behavior: Behavior normal.     ED Results / Procedures / Treatments   Labs (all labs ordered are listed, but only abnormal results are displayed) Labs Reviewed  BASIC METABOLIC PANEL - Abnormal; Notable for the following components:      Result Value   Glucose, Bld 148 (*)    Creatinine, Ser 1.02 (*)    GFR, Estimated 58 (*)    All other components within normal limits  CBC WITH DIFFERENTIAL/PLATELET - Abnormal; Notable for the following components:   Hemoglobin 11.5 (*)    HCT 35.2 (*)    All other components within normal limits  MAGNESIUM - Abnormal; Notable for the following components:   Magnesium 1.5 (*)    All other components within normal limits  TSH    EKG None  Radiology DG Chest 2 View  Result Date: 07/27/2020 CLINICAL DATA:  Palpitations. EXAM: CHEST - 2 VIEW COMPARISON:  No recent prior. FINDINGS: Mediastinum and hilar structures normal. Heart size normal. No focal infiltrate. No pleural effusion or pneumothorax. Mild thoracic spine scoliosis. IMPRESSION: No acute cardiopulmonary disease. Electronically Signed   By: Marcello Moores  Register   On: 07/27/2020 09:17     Procedures Procedures   Medications Ordered in ED Medications - No data to display  ED Course  I have reviewed the triage vital signs and the nursing notes.  Pertinent labs & imaging results that were available during my care of the patient were reviewed by me and considered in my medical decision making (see chart for details). Patient with palpitation symptoms.  EKG chest x-ray CBC chemistries all unremarkable except for mild low magnesium.   MDM Rules/Calculators/A&P  Palpitations probably stress related.  She is given some magnesium will follow up with her PCP Final Clinical Impression(s) / ED Diagnoses Final diagnoses:  None    Rx / DC Orders ED Discharge Orders    None       Milton Ferguson, MD 07/27/20 1704

## 2020-07-27 NOTE — ED Provider Notes (Signed)
Emergency Medicine Provider Triage Evaluation Note  Caroline Blair , a 75 y.o. female  was evaluated in triage.  Pt complains of palpitations.  Patient reports over the past few days she has been having intermittent episodes where she feels like her heart is fluttering and beating rapidly.  No associated chest pain, shortness of breath, lightheadedness or syncope.  Does report sometimes feeling nervous and anxious when this occurs. Currently not having palpitations. No prior history of palpitations or known arrhythmia.  No known thyroid disease.  No cardiac history  Review of Systems  Positive: Palpitations Negative: Chest pain, shortness of breath, syncope, fever, vomiting  Physical Exam  BP (!) 141/76 (BP Location: Left Arm)   Pulse 92   Temp 98.2 F (36.8 C) (Oral)   Resp (!) 21   SpO2 100%  Gen:   Awake, no distress   Resp:  Normal effort  Cardiac: RRR MSK:   Moves extremities without difficulty  Other:    Medical Decision Making  Medically screening exam initiated at 8:36 AM.  Appropriate orders placed.  ANALYCIA KHOKHAR was informed that the remainder of the evaluation will be completed by another provider, this initial triage assessment does not replace that evaluation, and the importance of remaining in the ED until their evaluation is complete.     Jacqlyn Larsen, PA-C 07/27/20 [id number]    Varney Biles, MD 07/27/20 [phone]

## 2020-08-04 ENCOUNTER — Other Ambulatory Visit: Payer: Self-pay | Admitting: Physician Assistant

## 2020-08-04 DIAGNOSIS — F419 Anxiety disorder, unspecified: Secondary | ICD-10-CM | POA: Diagnosis not present

## 2020-08-04 DIAGNOSIS — R1013 Epigastric pain: Secondary | ICD-10-CM

## 2020-08-04 DIAGNOSIS — R790 Abnormal level of blood mineral: Secondary | ICD-10-CM | POA: Diagnosis not present

## 2020-08-04 DIAGNOSIS — R1310 Dysphagia, unspecified: Secondary | ICD-10-CM | POA: Diagnosis not present

## 2020-08-13 ENCOUNTER — Ambulatory Visit
Admission: RE | Admit: 2020-08-13 | Discharge: 2020-08-13 | Disposition: A | Discharge status: Home / Self Care | Payer: Medicare Other | Source: Ambulatory Visit | Attending: Family Medicine | Admitting: Family Medicine

## 2020-08-13 ENCOUNTER — Other Ambulatory Visit: Payer: Self-pay

## 2020-08-13 DIAGNOSIS — Z1231 Encounter for screening mammogram for malignant neoplasm of breast: Secondary | ICD-10-CM

## 2020-08-13 IMAGING — MG MM DIGITAL SCREENING BILAT W/ TOMO AND CAD
8 series · 8 of 24 positions shown · non-contrast
Comparison: Previous exam(s).

CLINICAL DATA: Screening.

EXAM:
DIGITAL SCREENING BILATERAL MAMMOGRAM WITH TOMOSYNTHESIS AND CAD
TECHNIQUE: Bilateral screening digital craniocaudal and mediolateral oblique
mammograms were obtained. Bilateral screening digital breast
tomosynthesis was performed. The images were evaluated with
computer-aided detection.

[L MLO synth-2D]
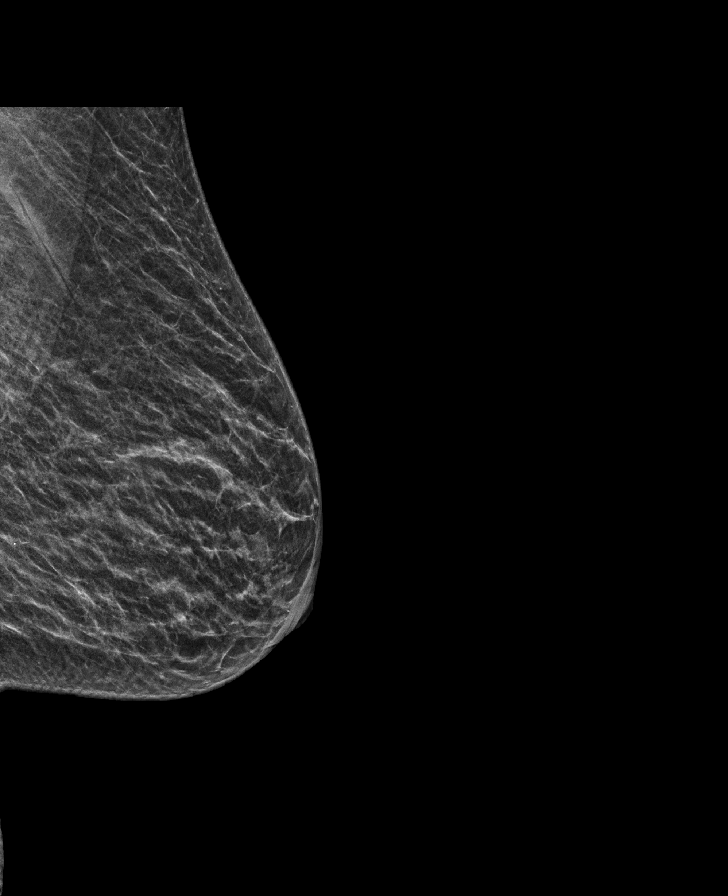

[L CC synth-2D]
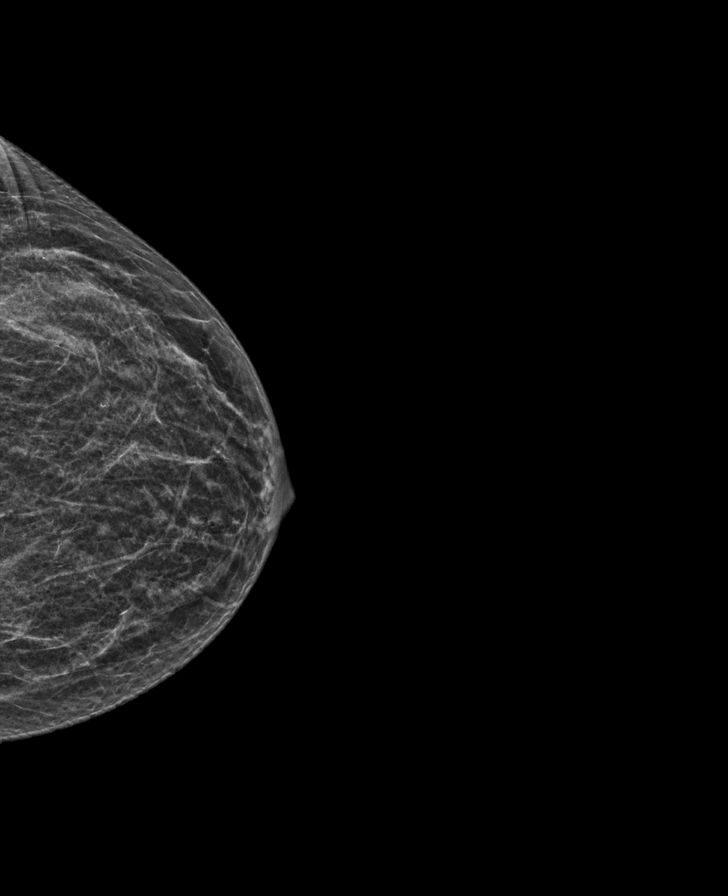

[R MLO synth-2D]
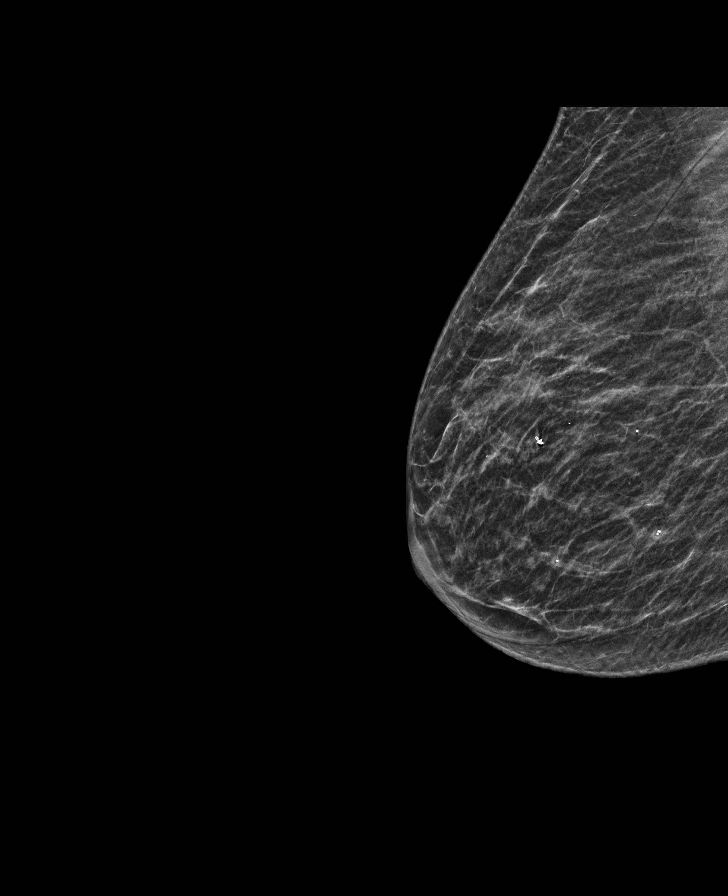

[R CC synth-2D]
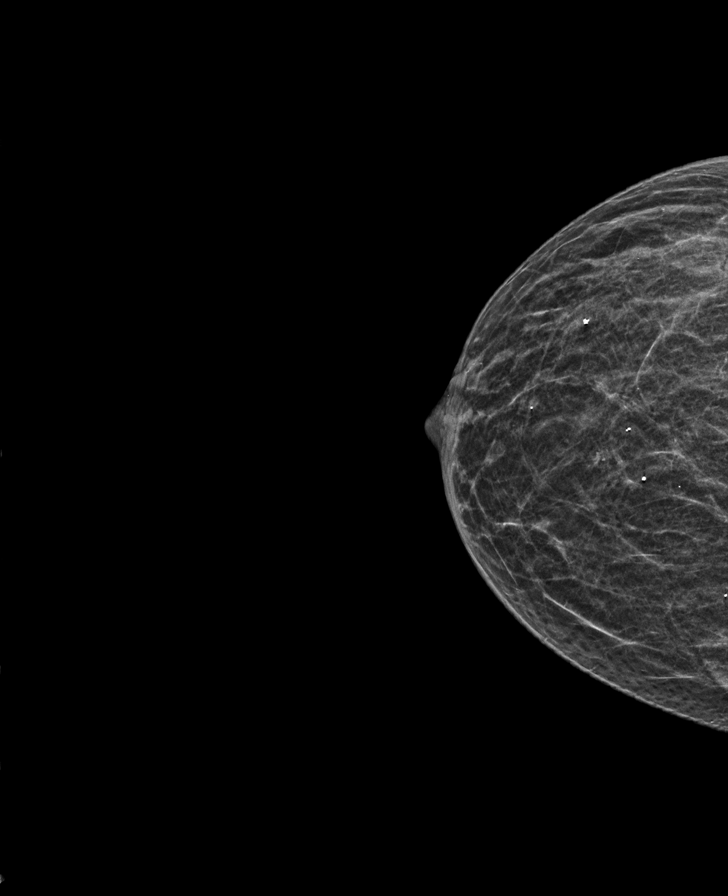

[R MLO tomo · tomo slice 21/40.0]
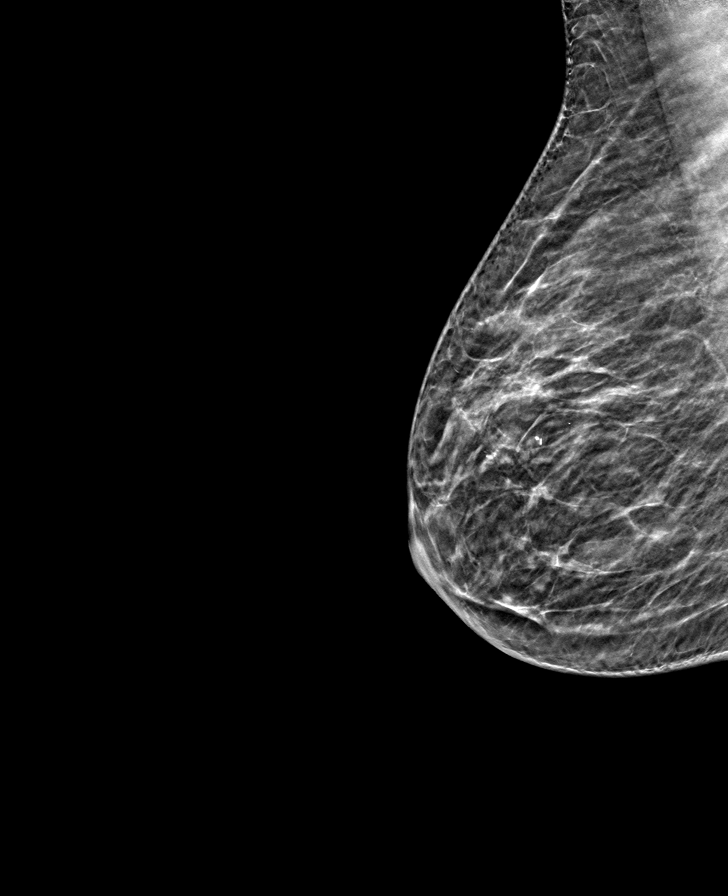

[R CC tomo · tomo slice 18/35.0]
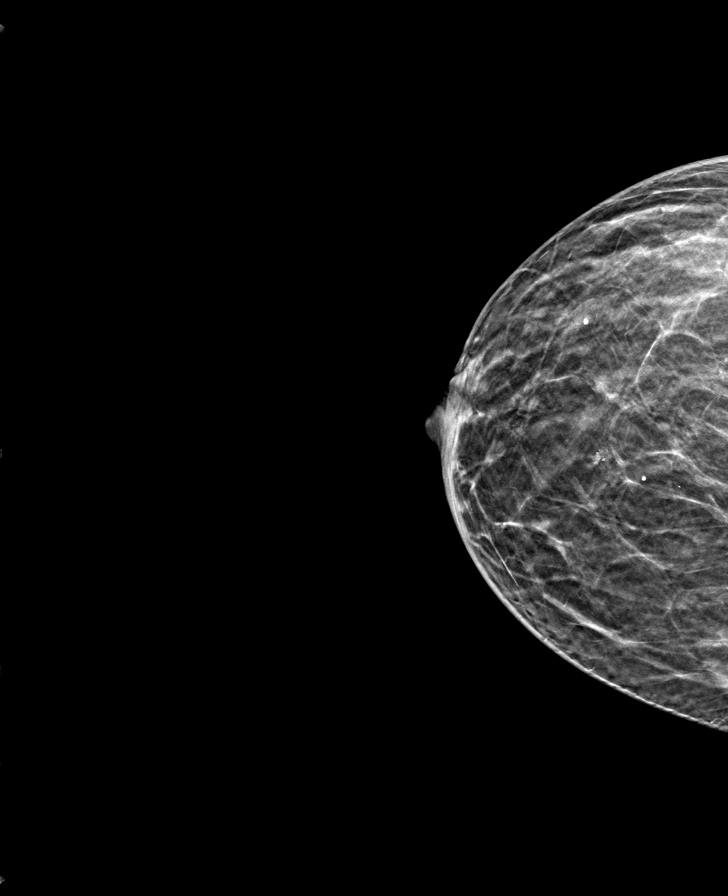

[L MLO tomo · tomo slice 22/43.0]
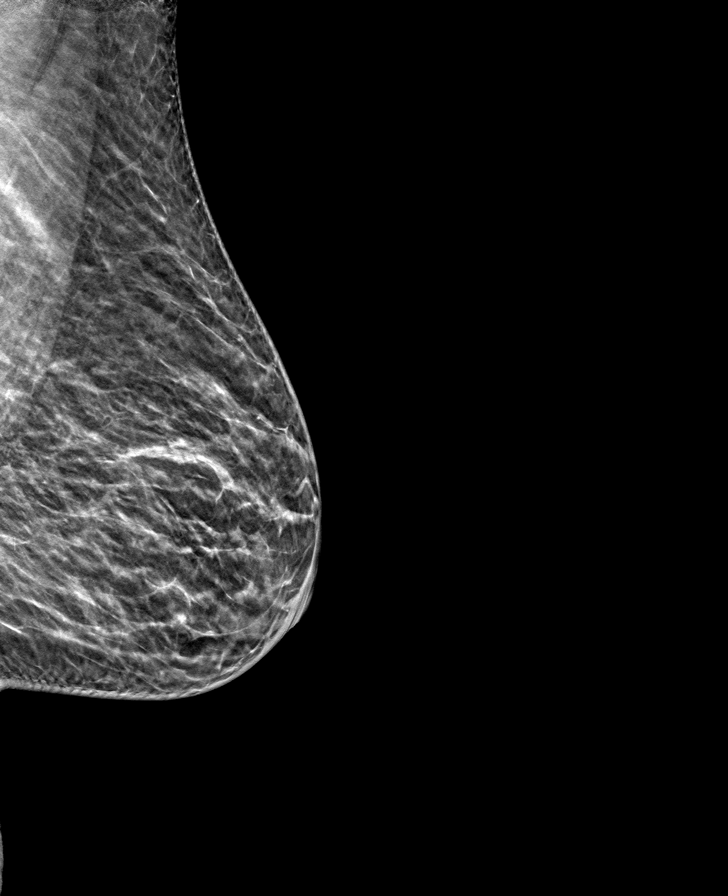

[L CC tomo · tomo slice 19/38.0]
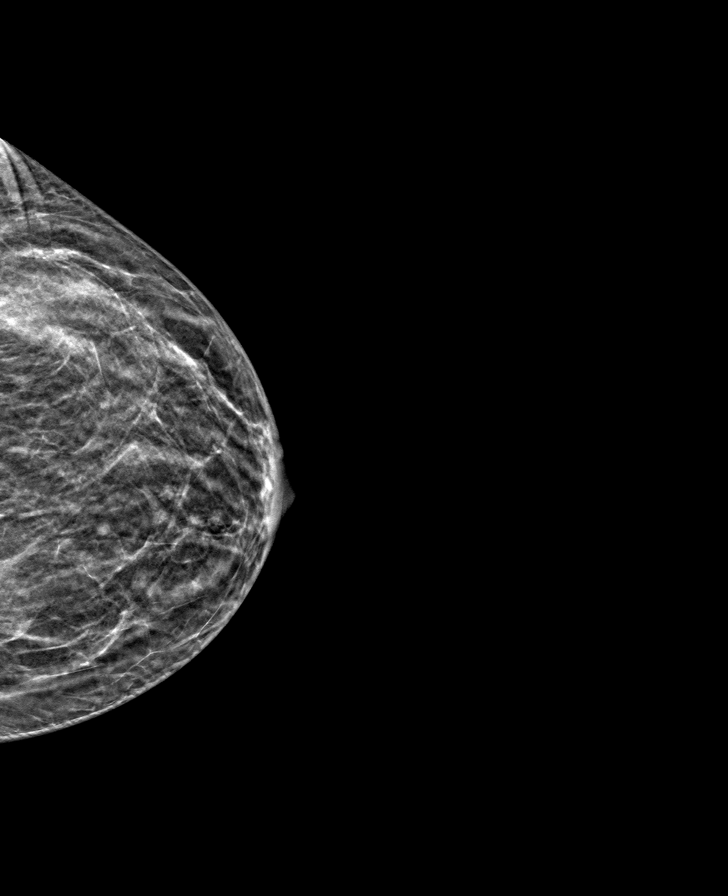

[8 of 24 positions shown; findings below may reference images not displayed]

ACR Breast Density Category b: There are scattered areas of
fibroglandular density.
FINDINGS: There are no findings suspicious for malignancy.
IMPRESSION: No mammographic evidence of malignancy. A result letter of this
screening mammogram will be mailed directly to the patient.

RECOMMENDATION:
Screening mammogram in one year. (Code:[BY])

BI-RADS CATEGORY  1: Negative.

## 2020-08-18 ENCOUNTER — Other Ambulatory Visit: Payer: Self-pay

## 2020-08-18 ENCOUNTER — Ambulatory Visit
Admission: RE | Admit: 2020-08-18 | Discharge: 2020-08-18 | Disposition: A | Discharge status: Home / Self Care | Payer: Medicare Other | Source: Ambulatory Visit | Attending: Physician Assistant | Admitting: Physician Assistant

## 2020-08-18 DIAGNOSIS — R1013 Epigastric pain: Secondary | ICD-10-CM

## 2020-08-18 DIAGNOSIS — K429 Umbilical hernia without obstruction or gangrene: Secondary | ICD-10-CM | POA: Diagnosis not present

## 2020-08-18 DIAGNOSIS — M4316 Spondylolisthesis, lumbar region: Secondary | ICD-10-CM | POA: Diagnosis not present

## 2020-08-18 DIAGNOSIS — Z8542 Personal history of malignant neoplasm of other parts of uterus: Secondary | ICD-10-CM | POA: Diagnosis not present

## 2020-08-18 DIAGNOSIS — K862 Cyst of pancreas: Secondary | ICD-10-CM | POA: Diagnosis not present

## 2020-08-18 IMAGING — CT CT ABD-PELV W/O CM
1 of 2 series · 14 of 32 positions shown, 19 images · non-contrast
Comparison: None.

CLINICAL DATA: Few weeks of dyspepsia. History of uterine cancer
[05] with hysterectomy and radiation.

EXAM:
CT ABDOMEN AND PELVIS WITHOUT CONTRAST
TECHNIQUE: Multidetector CT imaging of the abdomen and pelvis was performed
following the standard protocol without IV contrast.

[Series 2: abd/pelvis w/(date) · axial · 0.76mm/px · z∈[-455,-75]mm · 14 of 86 slices shown, 19 images]
[im 5/86  soft-tissue]
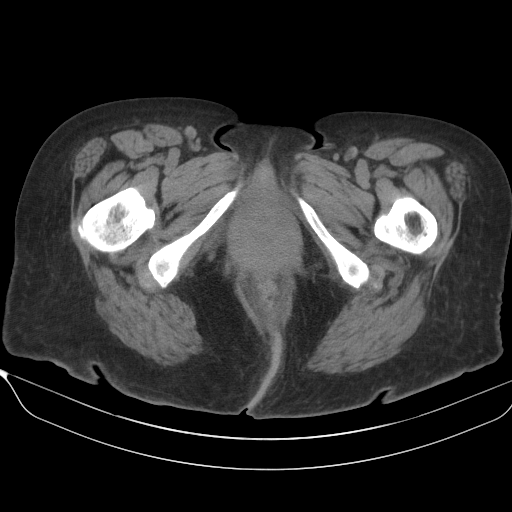
[im 5/86  bone]
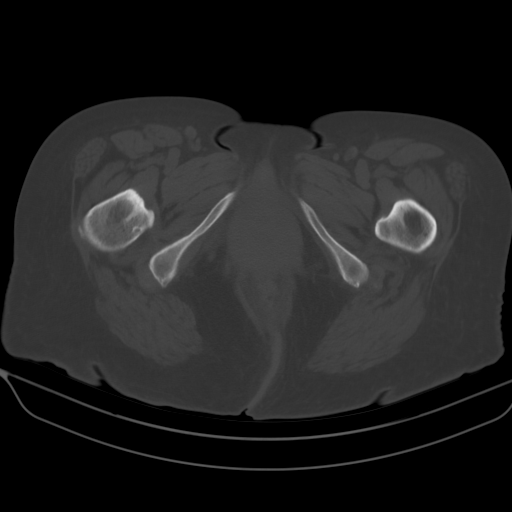
[im 14/86  soft-tissue]
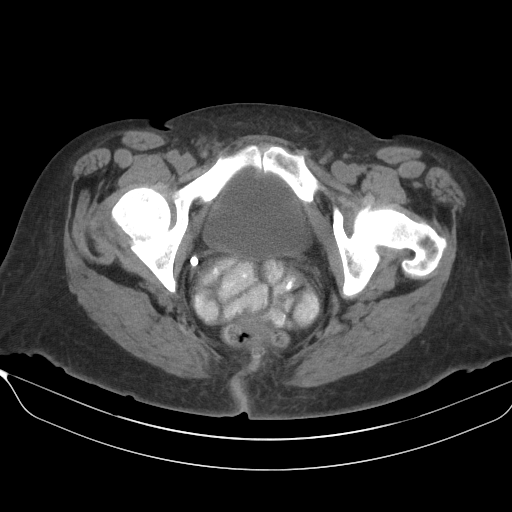
[im 18/86  soft-tissue]
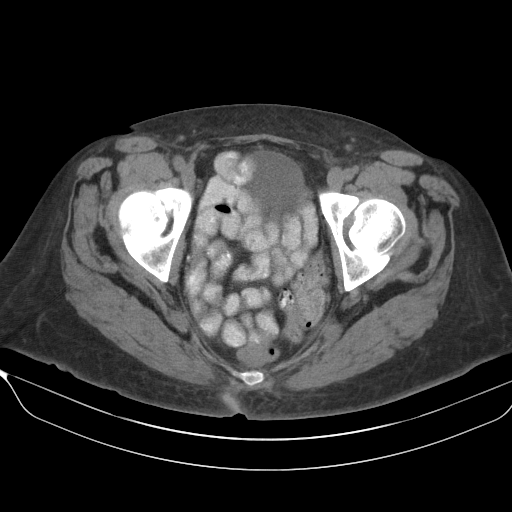
[im 23/86  soft-tissue]
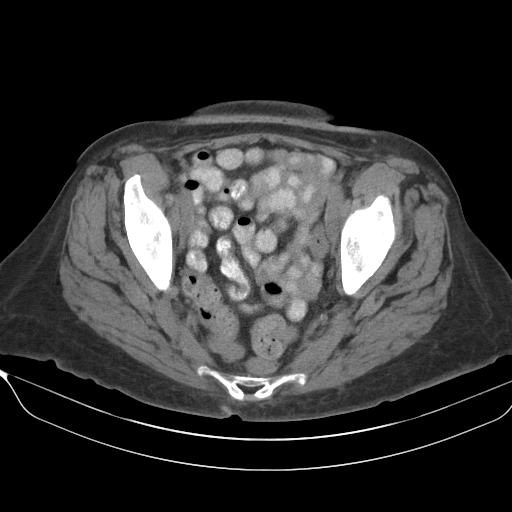
[im 32/86  soft-tissue]
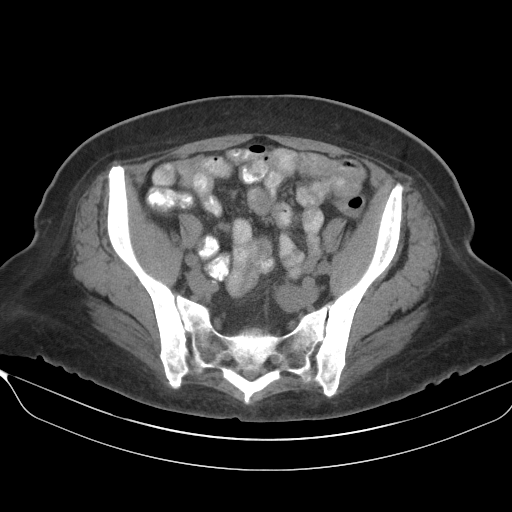
[im 36/86  soft-tissue]
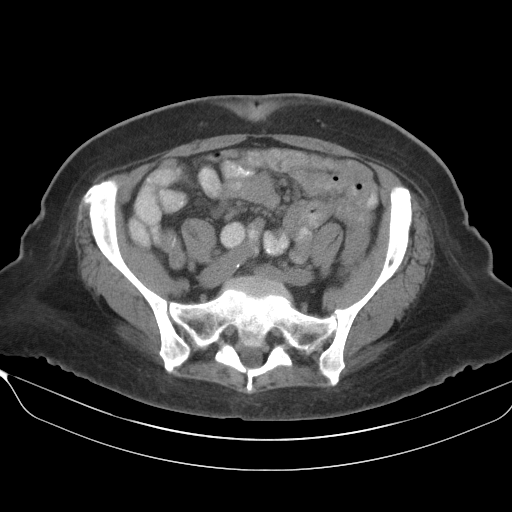
[im 45/86  soft-tissue]
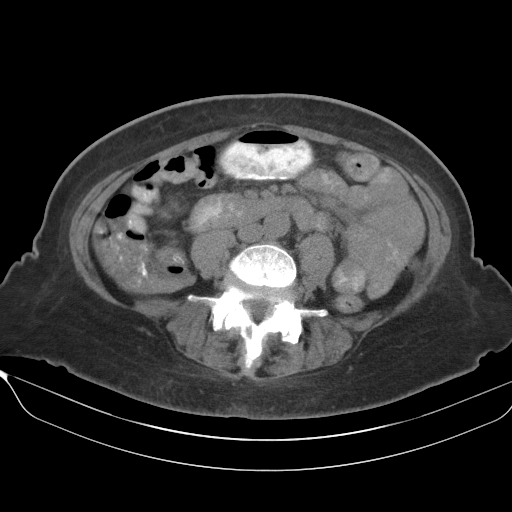
[im 50/86  soft-tissue]
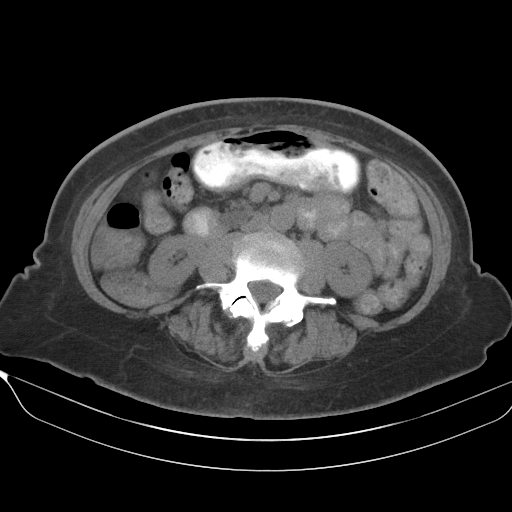
[im 54/86  soft-tissue]
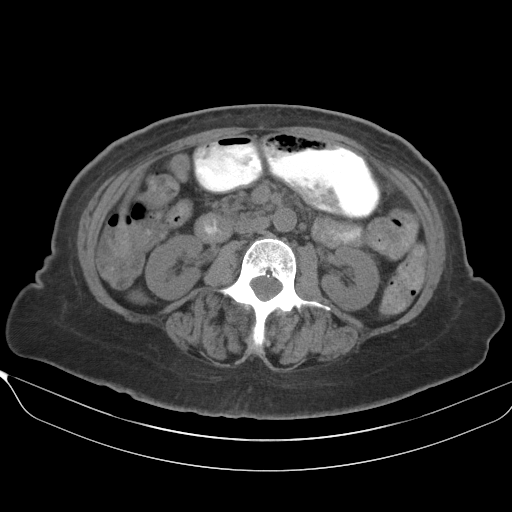
[im 54/86  bone]
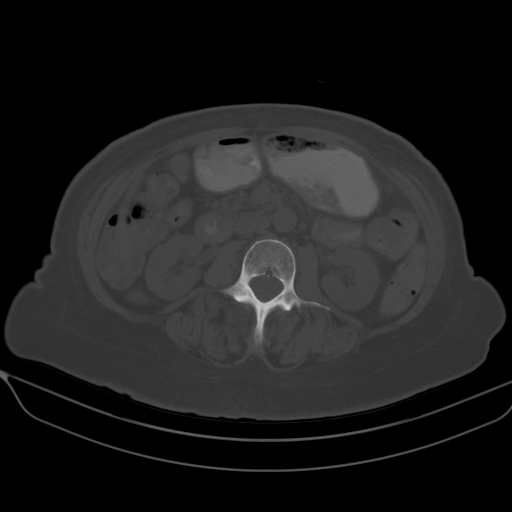
[im 63/86  soft-tissue]
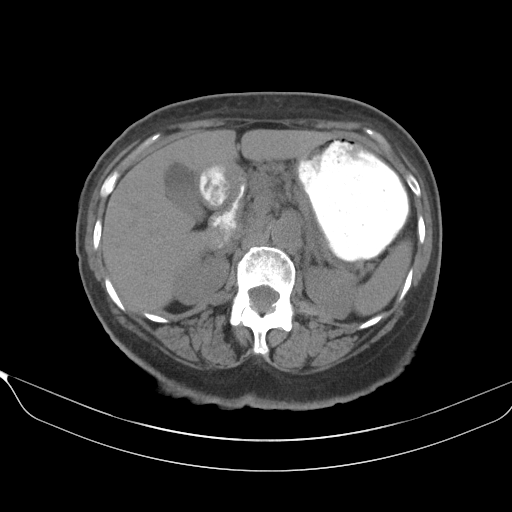
[im 68/86  soft-tissue]
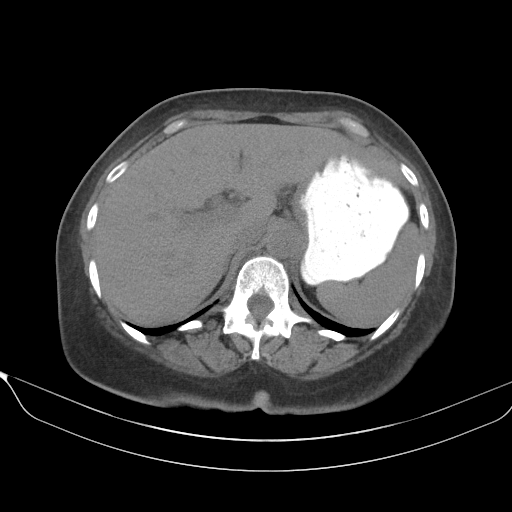
[im 68/86  lung]
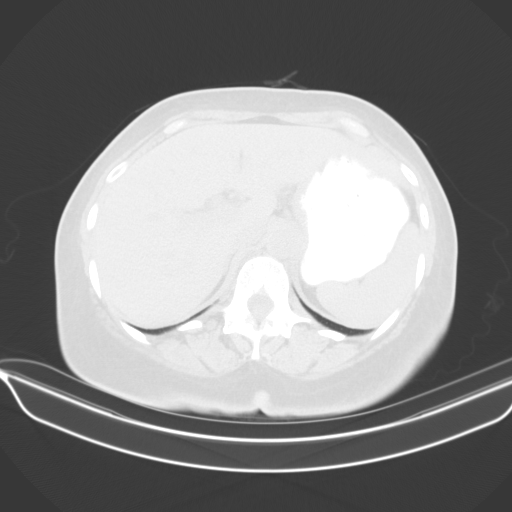
[im 72/86  soft-tissue]
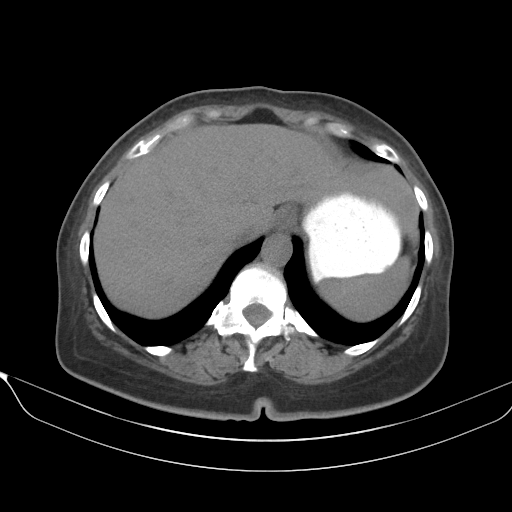
[im 72/86  lung]
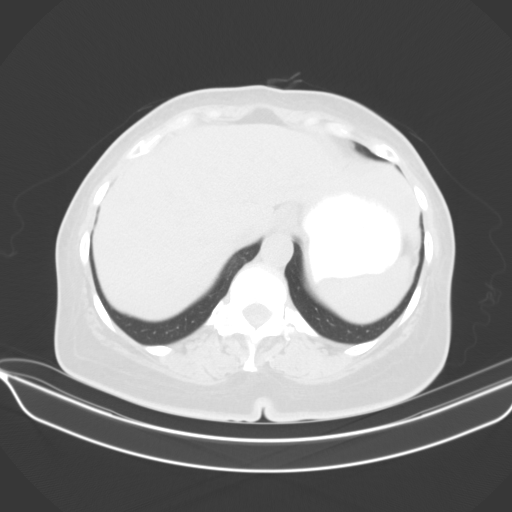
[im 77/86  lung]
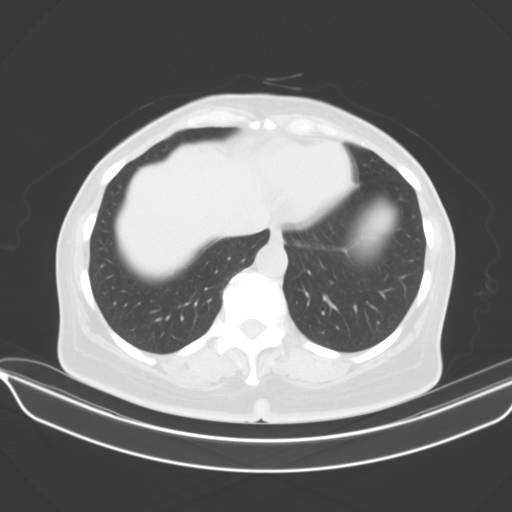
[im 81/86  soft-tissue]
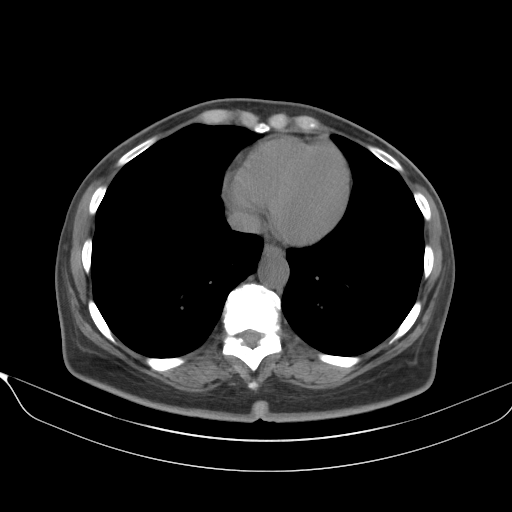
[im 81/86  lung]
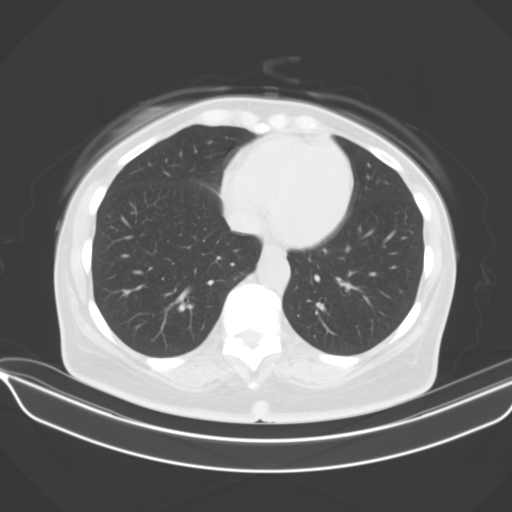

[14 of 32 positions shown; findings below may reference images not displayed]

FINDINGS: Lower chest: Lung bases are normal.

Hepatobiliary: Evidence of a small amount of gallbladder sludge
versus nonshadowing stones. Liver and biliary tree are normal.

Pancreas: Oval cystic structure at the junction of the head of
pancreas to the third portion of the duodenum measuring 1.1 x
cm. Pancreas is otherwise unremarkable.

Spleen: Normal.

Adrenals/Urinary Tract: Adrenal glands are normal. Kidneys are
normal in size without hydronephrosis or nephrolithiasis. Ureters
and bladder are normal.

Stomach/Bowel: Stomach and small bowel are normal. Appendix is
normal. Colon is unremarkable

Vascular/Lymphatic: Abdominal aorta is normal caliber. No
significant adenopathy.

Reproductive: Previous hysterectomy.

Other: Small umbilical hernia containing only peritoneal fat.

Musculoskeletal: Mild degenerative change of the spine. Multilevel
disc disease over the lumbar spine worse at the L4-5 level. There is
a grade 1-2 anterolisthesis of L4 on L5. Mild degenerative change of
the hips.
IMPRESSION: 1. No acute findings in the abdomen/pelvis.
2. Evidence of a small amount of gallbladder sludge versus
nonshadowing stones.
3. Oval cystic structure at the junction of the head of the pancreas
to the third portion of the duodenum measuring 1.1 x 2.1 cm. This
may represent a pancreatic cyst, pseudocyst or cystic neoplasm.
Recommend further evaluation with MRI of the abdomen with and
without contrast.
4. Small umbilical hernia containing only peritoneal fat.
5. Grade 1-2 anterolisthesis of L4 on L5 with multilevel disc
disease worse at the L4-5 level.

## 2020-08-20 ENCOUNTER — Other Ambulatory Visit: Payer: Self-pay | Admitting: Physician Assistant

## 2020-08-20 DIAGNOSIS — K862 Cyst of pancreas: Secondary | ICD-10-CM

## 2020-08-20 DIAGNOSIS — D136 Benign neoplasm of pancreas: Secondary | ICD-10-CM

## 2020-09-01 ENCOUNTER — Ambulatory Visit
Admission: RE | Admit: 2020-09-01 | Discharge: 2020-09-01 | Disposition: A | Discharge status: Home / Self Care | Payer: Medicare Other | Source: Ambulatory Visit | Attending: Physician Assistant | Admitting: Physician Assistant

## 2020-09-01 DIAGNOSIS — K862 Cyst of pancreas: Secondary | ICD-10-CM

## 2020-09-01 DIAGNOSIS — R935 Abnormal findings on diagnostic imaging of other abdominal regions, including retroperitoneum: Secondary | ICD-10-CM | POA: Diagnosis not present

## 2020-09-01 IMAGING — MR MR ABDOMEN WO/W CM MRCP
18 of 20 series · 42 of 48 positions shown · IV contrast (multihance)
Comparison: CT scan [DATE]

CLINICAL DATA: Right upper quadrant abdominal pain. Hypodense
pancreatic lesion on CT of [DATE], for further characterization.

EXAM:
MRI ABDOMEN WITHOUT AND WITH CONTRAST (INCLUDING MRCP)
TECHNIQUE: Multiplanar multisequence MR imaging of the abdomen was performed
both before and after the administration of intravenous contrast.
Heavily T2-weighted images of the biliary and pancreatic ducts were
obtained, and three-dimensional MRCP images were rendered by post
processing.
CONTRAST:  16mL MULTIHANCE GADOBENATE DIMEGLUMINE 529 MG/ML IV SOLN

[Series 5: T2 · coronal · 5.0mm · 1.56mm/px · 1 of 36 slices shown (1 of 4)]
[im 1/36]
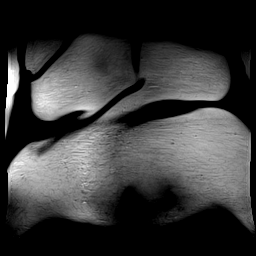

[Series 6: T1 · axial · 3.0mm · 1.19mm/px · z∈[-70,+143]mm · 5 of 144 slices shown]
[im 1/144]
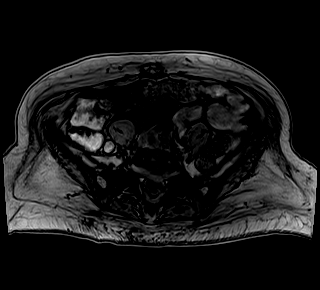
[im 36/144]
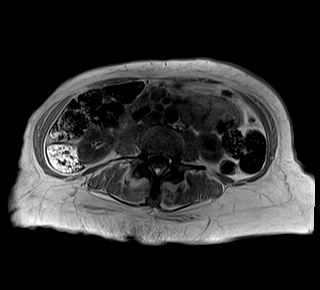
[im 72/144]
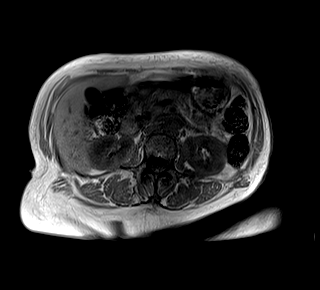
[im 108/144]
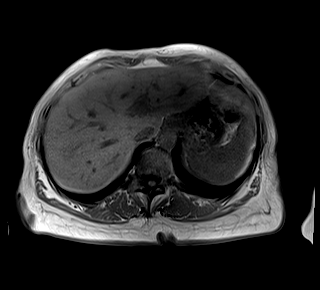
[im 144/144]
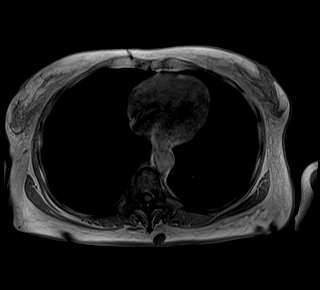

[Series 7: bSSFP · axial · 5.0mm · 1.25mm/px · 1 of 38 slices shown]
[im 1/38]
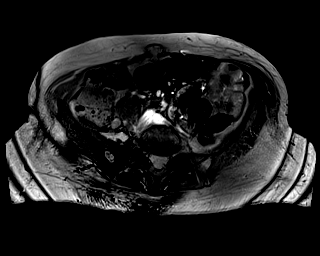

[Series 8: T2 · axial · 5.0mm · 1.48mm/px · 1 of 38 slices shown (2 of 4)]
[im 1/38]
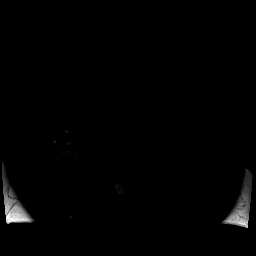

[Series 10: DWI · axial · 5.0mm · 1.42mm/px · z∈[-70,+140]mm · 4 of 108 slices shown (1 of 2)]
[im 1/108]
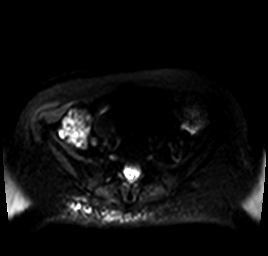
[im 36/108]
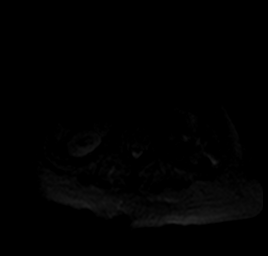
[im 72/108]
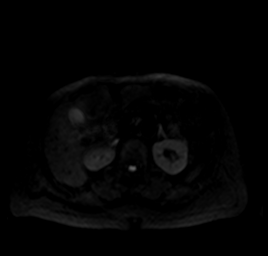
[im 108/108]
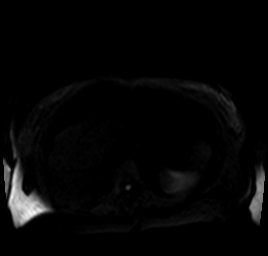

[Series 11: DWI · axial · 5.0mm · 1.42mm/px · 1 of 36 slices shown (2 of 2)]
[im 1/36]
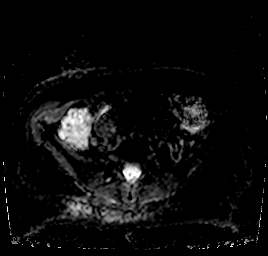

[Series 12: T2 · axial · 6.0mm · 1.22mm/px · 1 of 30 slices shown (3 of 4)]
[im 1/30]
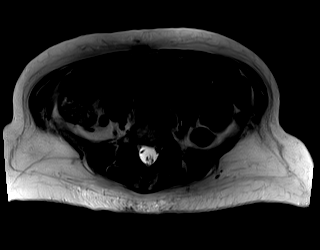

[Series 14: T2 · coronal · 3.0mm · 1.19mm/px · 1 of 19 slices shown (4 of 4)]
[im 1/19]
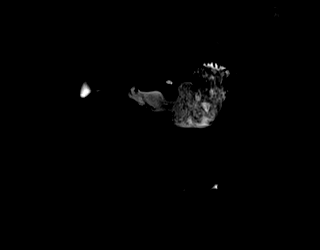

[Series 15: MRCP · coronal · 1.0mm · 0.49mm/px · 2 of 64 slices shown]
[im 1/64]
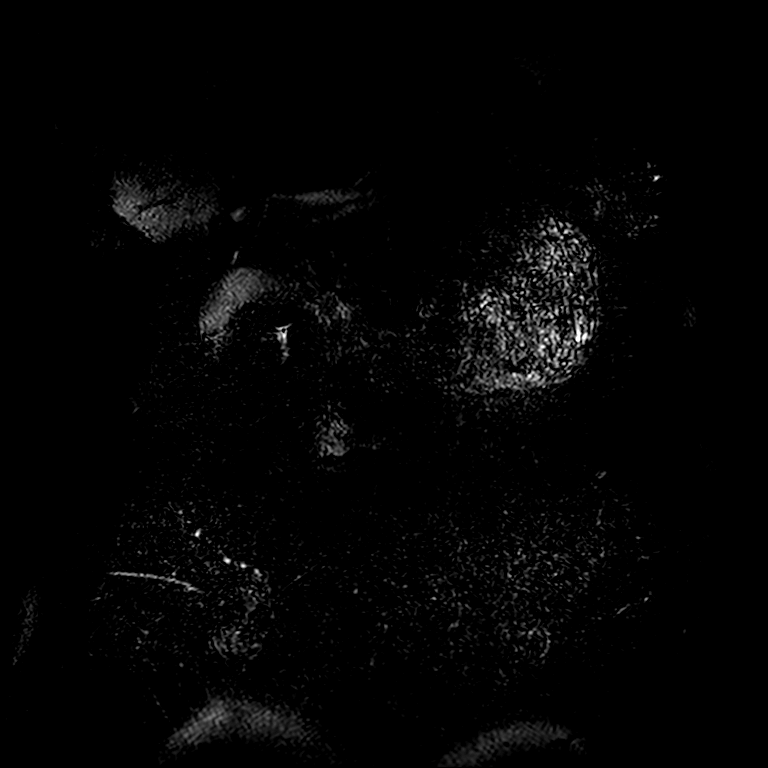
[im 64/64]
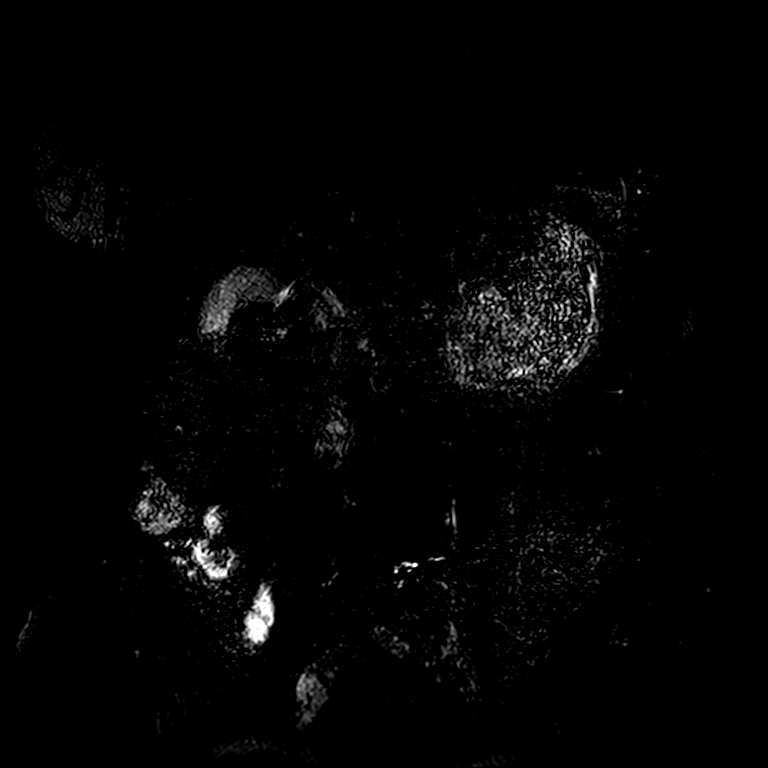

[Series 17: T1 dynamic · axial · non-contrast · 3.0mm · 1.25mm/px · z∈[-54,+159]mm · 3 of 72 slices shown]
[im 1/72]
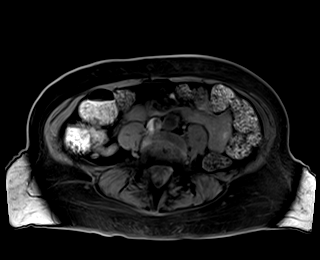
[im 36/72]
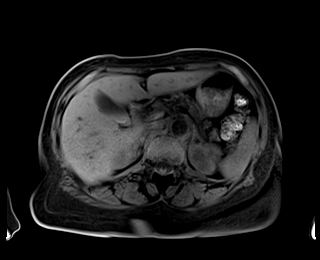
[im 72/72]
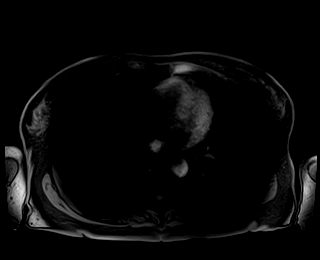

[Series 18: T1 dynamic post-contrast · axial · 3.0mm · 1.25mm/px · z∈[-54,+159]mm · 3 of 72 slices shown (1 of 8)]
[im 1/72]
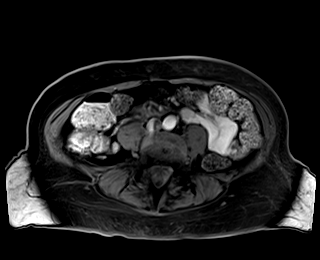
[im 36/72]
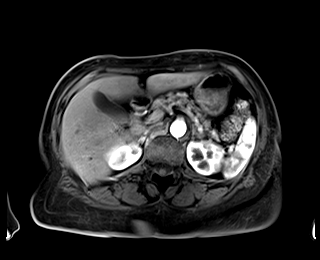
[im 72/72]
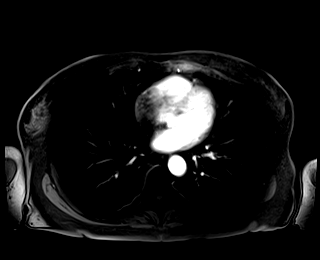

[Series 19: T1 dynamic post-contrast · axial · 3.0mm · 1.25mm/px · z∈[-54,+159]mm · 3 of 72 slices shown (2 of 8)]
[im 1/72]
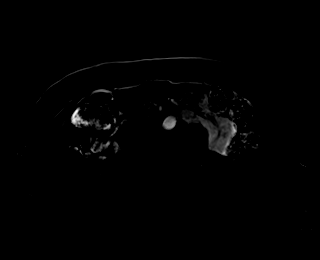
[im 36/72]
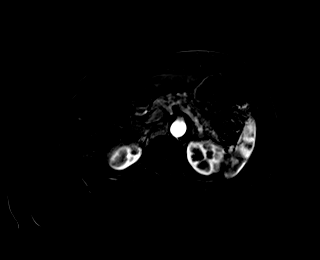
[im 72/72]
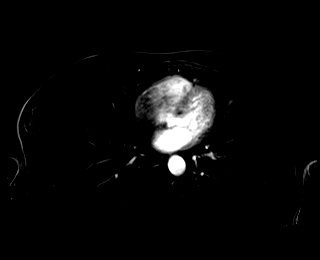

[Series 20: T1 dynamic post-contrast · axial · 3.0mm · 1.25mm/px · z∈[-54,+159]mm · 3 of 72 slices shown (3 of 8)]
[im 1/72]
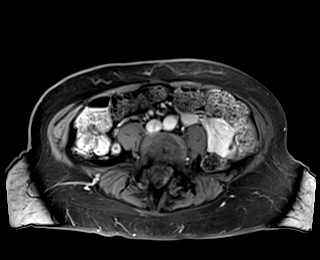
[im 36/72]
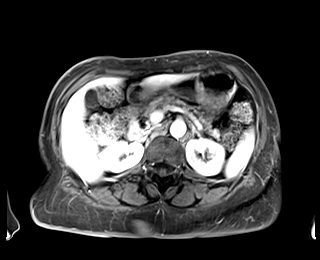
[im 72/72]
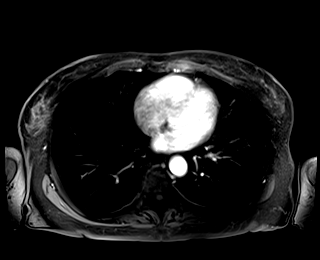

[Series 21: T1 dynamic post-contrast · axial · 3.0mm · 1.25mm/px · z∈[-54,+159]mm · 3 of 72 slices shown (4 of 8)]
[im 1/72]
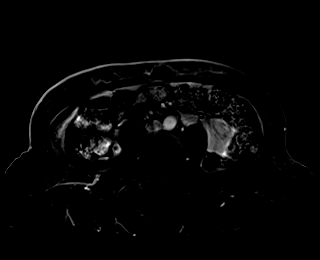
[im 36/72]
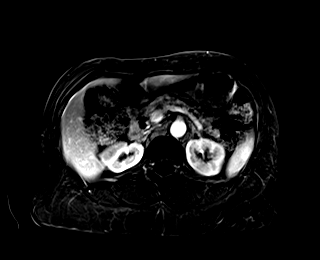
[im 72/72]
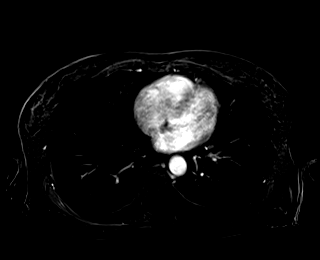

[Series 22: T1 dynamic post-contrast · axial · 3.0mm · 1.25mm/px · z∈[-54,+159]mm · 3 of 72 slices shown (5 of 8)]
[im 1/72]
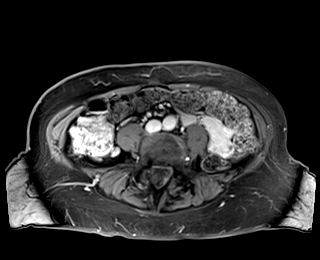
[im 36/72]
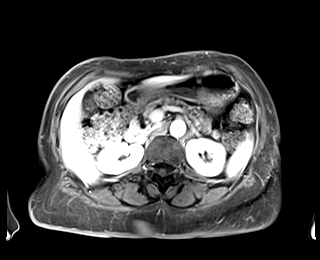
[im 72/72]
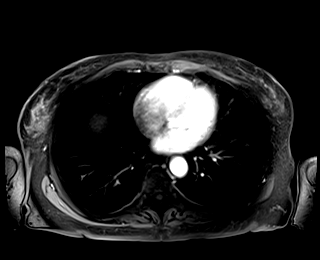

[Series 23: T1 dynamic post-contrast · axial · 3.0mm · 1.25mm/px · z∈[-54,+159]mm · 3 of 72 slices shown (6 of 8)]
[im 1/72]
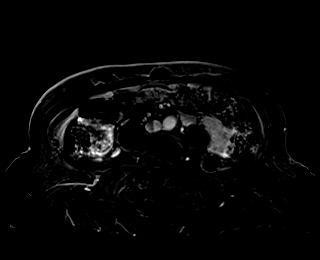
[im 36/72]
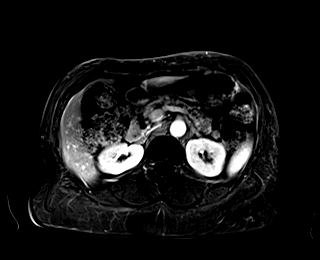
[im 72/72]
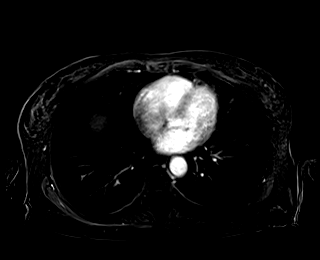

[Series 24: T1 dynamic post-contrast · coronal · 3.0mm · 1.25mm/px · 3 of 72 slices shown (7 of 8)]
[im 1/72]
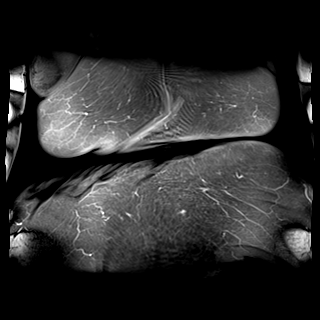
[im 36/72]
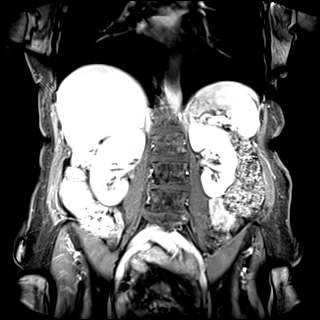
[im 72/72]
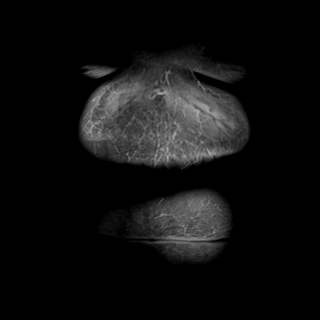

[Series 26: T1 dynamic post-contrast · axial · 3.0mm · 1.25mm/px · 1 of 72 slices shown (8 of 8)]
[im 1/72]
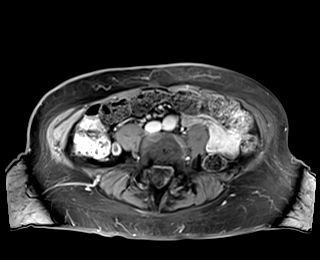

[42 of 48 positions shown; findings below may reference images not displayed]

FINDINGS: Lower chest: Unremarkable

Hepatobiliary: Unremarkable

Pancreas: There are few scattered small nonenhancing pancreatic
cystic lesions. The most notable of these is a tubular serpentine
lesion along the inferior margin of the pancreatic head with the
coiled appearance measuring 3.9 by 2.1 by 1.1 cm on image 24 series
5. No significant enhancement. No appreciable internal nodularity.

Spleen:  Unremarkable

Adrenals/Urinary Tract:  Unremarkable

Stomach/Bowel: Prominent stool throughout the colon favors
constipation.

Vascular/Lymphatic:  Unremarkable

Other:  No supplemental non-categorized findings.

Musculoskeletal: Unremarkable
IMPRESSION: 1. The dominant pancreatic cystic lesion measures 3.9 by 2.1 by
cm and has a tubular configuration. This lesion is along the
inferior margin of the pancreatic head. This could represent a
postinflammatory cystic lesion or intraductal papillary mucinous
neoplasm. Based on current guidelines, reasonable follow up might
include endoscopic ultrasound with fine-needle aspiration, or repeat
pancreatic protocol MRI in 6 months time to assess for growth. This
recommendation follows ACR consensus guidelines: Management of
Incidental Pancreatic Cysts: A White Paper of the ACR Incidental
Findings Committee. [HOSPITAL] [O7];[DATE].
2. Several other small pancreatic cystic lesions are identified
although management should be guided by the dominant lesion.
3.  Prominent stool throughout the colon favors constipation.

## 2020-09-01 MED ORDER — GADOBENATE DIMEGLUMINE 529 MG/ML IV SOLN
16.0000 mL | Freq: Once | INTRAVENOUS | Status: AC | PRN
  Administered 2020-09-01: 16 mL via INTRAVENOUS

## 2020-09-15 DIAGNOSIS — R790 Abnormal level of blood mineral: Secondary | ICD-10-CM | POA: Diagnosis not present

## 2020-09-28 DIAGNOSIS — R790 Abnormal level of blood mineral: Secondary | ICD-10-CM | POA: Diagnosis not present

## 2020-09-28 DIAGNOSIS — R6881 Early satiety: Secondary | ICD-10-CM | POA: Diagnosis not present

## 2020-09-28 DIAGNOSIS — K449 Diaphragmatic hernia without obstruction or gangrene: Secondary | ICD-10-CM | POA: Diagnosis not present

## 2020-09-28 DIAGNOSIS — K222 Esophageal obstruction: Secondary | ICD-10-CM | POA: Diagnosis not present

## 2020-09-28 DIAGNOSIS — R1310 Dysphagia, unspecified: Secondary | ICD-10-CM | POA: Diagnosis not present

## 2020-09-28 DIAGNOSIS — K319 Disease of stomach and duodenum, unspecified: Secondary | ICD-10-CM | POA: Diagnosis not present

## 2020-09-28 DIAGNOSIS — K3189 Other diseases of stomach and duodenum: Secondary | ICD-10-CM | POA: Diagnosis not present

## 2020-09-28 DIAGNOSIS — R634 Abnormal weight loss: Secondary | ICD-10-CM | POA: Diagnosis not present

## 2020-09-30 DIAGNOSIS — R7989 Other specified abnormal findings of blood chemistry: Secondary | ICD-10-CM | POA: Diagnosis not present

## 2020-09-30 DIAGNOSIS — R790 Abnormal level of blood mineral: Secondary | ICD-10-CM | POA: Diagnosis not present

## 2020-10-01 DIAGNOSIS — K319 Disease of stomach and duodenum, unspecified: Secondary | ICD-10-CM | POA: Diagnosis not present

## 2020-10-04 DIAGNOSIS — R790 Abnormal level of blood mineral: Secondary | ICD-10-CM | POA: Diagnosis not present

## 2020-10-27 DIAGNOSIS — R630 Anorexia: Secondary | ICD-10-CM | POA: Diagnosis not present

## 2020-10-27 DIAGNOSIS — R946 Abnormal results of thyroid function studies: Secondary | ICD-10-CM | POA: Diagnosis not present

## 2020-10-27 DIAGNOSIS — R634 Abnormal weight loss: Secondary | ICD-10-CM | POA: Diagnosis not present

## 2020-10-27 DIAGNOSIS — R002 Palpitations: Secondary | ICD-10-CM | POA: Diagnosis not present

## 2020-10-27 DIAGNOSIS — I10 Essential (primary) hypertension: Secondary | ICD-10-CM | POA: Diagnosis not present

## 2020-11-03 DIAGNOSIS — R946 Abnormal results of thyroid function studies: Secondary | ICD-10-CM | POA: Diagnosis not present

## 2020-11-03 DIAGNOSIS — I10 Essential (primary) hypertension: Secondary | ICD-10-CM | POA: Diagnosis not present

## 2020-11-12 DIAGNOSIS — E042 Nontoxic multinodular goiter: Secondary | ICD-10-CM | POA: Diagnosis not present

## 2020-11-12 DIAGNOSIS — R946 Abnormal results of thyroid function studies: Secondary | ICD-10-CM | POA: Diagnosis not present

## 2020-11-23 DIAGNOSIS — R609 Edema, unspecified: Secondary | ICD-10-CM | POA: Diagnosis not present

## 2020-11-23 DIAGNOSIS — E042 Nontoxic multinodular goiter: Secondary | ICD-10-CM | POA: Diagnosis not present

## 2020-11-23 DIAGNOSIS — I10 Essential (primary) hypertension: Secondary | ICD-10-CM | POA: Diagnosis not present

## 2020-12-01 DIAGNOSIS — R609 Edema, unspecified: Secondary | ICD-10-CM | POA: Diagnosis not present

## 2020-12-01 DIAGNOSIS — I10 Essential (primary) hypertension: Secondary | ICD-10-CM | POA: Diagnosis not present

## 2020-12-09 DIAGNOSIS — Z23 Encounter for immunization: Secondary | ICD-10-CM | POA: Diagnosis not present

## 2020-12-09 DIAGNOSIS — E042 Nontoxic multinodular goiter: Secondary | ICD-10-CM | POA: Diagnosis not present

## 2020-12-09 DIAGNOSIS — I10 Essential (primary) hypertension: Secondary | ICD-10-CM | POA: Diagnosis not present

## 2020-12-09 DIAGNOSIS — R609 Edema, unspecified: Secondary | ICD-10-CM | POA: Diagnosis not present

## 2020-12-28 DIAGNOSIS — Z23 Encounter for immunization: Secondary | ICD-10-CM | POA: Diagnosis not present

## 2021-01-18 ENCOUNTER — Observation Stay (HOSPITAL_COMMUNITY): Payer: Medicare Other

## 2021-01-18 ENCOUNTER — Observation Stay (HOSPITAL_COMMUNITY)
Admission: EM | Admit: 2021-01-18 | Discharge: 2021-01-19 | Disposition: A | Discharge status: Home / Self Care | Payer: Medicare Other | Attending: Internal Medicine | Admitting: Internal Medicine

## 2021-01-18 ENCOUNTER — Emergency Department (HOSPITAL_COMMUNITY): Payer: Medicare Other

## 2021-01-18 ENCOUNTER — Encounter (HOSPITAL_COMMUNITY): Payer: Self-pay | Admitting: Internal Medicine

## 2021-01-18 ENCOUNTER — Other Ambulatory Visit: Payer: Self-pay

## 2021-01-18 DIAGNOSIS — I129 Hypertensive chronic kidney disease with stage 1 through stage 4 chronic kidney disease, or unspecified chronic kidney disease: Secondary | ICD-10-CM | POA: Diagnosis not present

## 2021-01-18 DIAGNOSIS — I639 Cerebral infarction, unspecified: Secondary | ICD-10-CM | POA: Diagnosis not present

## 2021-01-18 DIAGNOSIS — Z7982 Long term (current) use of aspirin: Secondary | ICD-10-CM | POA: Diagnosis not present

## 2021-01-18 DIAGNOSIS — E1129 Type 2 diabetes mellitus with other diabetic kidney complication: Secondary | ICD-10-CM

## 2021-01-18 DIAGNOSIS — N1831 Chronic kidney disease, stage 3a: Secondary | ICD-10-CM | POA: Diagnosis present

## 2021-01-18 DIAGNOSIS — E1122 Type 2 diabetes mellitus with diabetic chronic kidney disease: Secondary | ICD-10-CM | POA: Diagnosis not present

## 2021-01-18 DIAGNOSIS — N179 Acute kidney failure, unspecified: Secondary | ICD-10-CM

## 2021-01-18 DIAGNOSIS — Z7984 Long term (current) use of oral hypoglycemic drugs: Secondary | ICD-10-CM | POA: Diagnosis not present

## 2021-01-18 DIAGNOSIS — R600 Localized edema: Secondary | ICD-10-CM | POA: Diagnosis not present

## 2021-01-18 DIAGNOSIS — R809 Proteinuria, unspecified: Secondary | ICD-10-CM

## 2021-01-18 DIAGNOSIS — R011 Cardiac murmur, unspecified: Secondary | ICD-10-CM | POA: Diagnosis present

## 2021-01-18 DIAGNOSIS — I10 Essential (primary) hypertension: Secondary | ICD-10-CM | POA: Diagnosis not present

## 2021-01-18 DIAGNOSIS — Y900 Blood alcohol level of less than 20 mg/100 ml: Secondary | ICD-10-CM | POA: Insufficient documentation

## 2021-01-18 DIAGNOSIS — I160 Hypertensive urgency: Secondary | ICD-10-CM

## 2021-01-18 DIAGNOSIS — E119 Type 2 diabetes mellitus without complications: Secondary | ICD-10-CM

## 2021-01-18 DIAGNOSIS — G459 Transient cerebral ischemic attack, unspecified: Principal | ICD-10-CM | POA: Diagnosis present

## 2021-01-18 DIAGNOSIS — Z20822 Contact with and (suspected) exposure to covid-19: Secondary | ICD-10-CM | POA: Insufficient documentation

## 2021-01-18 DIAGNOSIS — Z79899 Other long term (current) drug therapy: Secondary | ICD-10-CM | POA: Diagnosis not present

## 2021-01-18 DIAGNOSIS — R479 Unspecified speech disturbances: Secondary | ICD-10-CM | POA: Diagnosis not present

## 2021-01-18 DIAGNOSIS — R4789 Other speech disturbances: Secondary | ICD-10-CM | POA: Diagnosis not present

## 2021-01-18 DIAGNOSIS — J341 Cyst and mucocele of nose and nasal sinus: Secondary | ICD-10-CM | POA: Diagnosis not present

## 2021-01-18 DIAGNOSIS — R4701 Aphasia: Secondary | ICD-10-CM | POA: Diagnosis not present

## 2021-01-18 DIAGNOSIS — I6523 Occlusion and stenosis of bilateral carotid arteries: Secondary | ICD-10-CM | POA: Diagnosis not present

## 2021-01-18 DIAGNOSIS — E785 Hyperlipidemia, unspecified: Secondary | ICD-10-CM | POA: Diagnosis present

## 2021-01-18 LAB — URINALYSIS, ROUTINE W REFLEX MICROSCOPIC
Bilirubin Urine: NEGATIVE
Glucose, UA: NEGATIVE mg/dL
Hgb urine dipstick: NEGATIVE
Ketones, ur: 5 mg/dL — AB
Leukocytes,Ua: NEGATIVE
Nitrite: NEGATIVE
Protein, ur: NEGATIVE mg/dL
Specific Gravity, Urine: 1.006 (ref 1.005–1.030)
pH: 7 (ref 5.0–8.0)

## 2021-01-18 LAB — DIFFERENTIAL
Abs Immature Granulocytes: 0.02 10*3/uL (ref 0.00–0.07)
Basophils Absolute: 0 10*3/uL (ref 0.0–0.1)
Basophils Relative: 1 %
Eosinophils Absolute: 0.1 10*3/uL (ref 0.0–0.5)
Eosinophils Relative: 2 %
Immature Granulocytes: 0 %
Lymphocytes Relative: 29 %
Lymphs Abs: 1.8 10*3/uL (ref 0.7–4.0)
Monocytes Absolute: 0.6 10*3/uL (ref 0.1–1.0)
Monocytes Relative: 10 %
Neutro Abs: 3.7 10*3/uL (ref 1.7–7.7)
Neutrophils Relative %: 58 %

## 2021-01-18 LAB — GLUCOSE, CAPILLARY: Glucose-Capillary: 94 mg/dL (ref 70–99)

## 2021-01-18 LAB — I-STAT CHEM 8, ED
BUN: 29 mg/dL — ABNORMAL HIGH (ref 8–23)
Calcium, Ion: 1.15 mmol/L (ref 1.15–1.40)
Chloride: 102 mmol/L (ref 98–111)
Creatinine, Ser: 1.2 mg/dL — ABNORMAL HIGH (ref 0.44–1.00)
Glucose, Bld: 100 mg/dL — ABNORMAL HIGH (ref 70–99)
HCT: 37 % (ref 36.0–46.0)
Hemoglobin: 12.6 g/dL (ref 12.0–15.0)
Potassium: 4.4 mmol/L (ref 3.5–5.1)
Sodium: 139 mmol/L (ref 135–145)
TCO2: 28 mmol/L (ref 22–32)

## 2021-01-18 LAB — RESP PANEL BY RT-PCR (FLU A&B, COVID) ARPGX2
Influenza A by PCR: NEGATIVE
Influenza B by PCR: NEGATIVE
SARS Coronavirus 2 by RT PCR: NEGATIVE

## 2021-01-18 LAB — CBC
HCT: 35.3 % — ABNORMAL LOW (ref 36.0–46.0)
Hemoglobin: 11.3 g/dL — ABNORMAL LOW (ref 12.0–15.0)
MCH: 28.3 pg (ref 26.0–34.0)
MCHC: 32 g/dL (ref 30.0–36.0)
MCV: 88.3 fL (ref 80.0–100.0)
Platelets: 363 10*3/uL (ref 150–400)
RBC: 4 MIL/uL (ref 3.87–5.11)
RDW: 13.2 % (ref 11.5–15.5)
WBC: 6.3 10*3/uL (ref 4.0–10.5)
nRBC: 0 % (ref 0.0–0.2)

## 2021-01-18 LAB — PROTIME-INR
INR: 1.1 (ref 0.8–1.2)
Prothrombin Time: 13.9 seconds (ref 11.4–15.2)

## 2021-01-18 LAB — CBG MONITORING, ED: Glucose-Capillary: 79 mg/dL (ref 70–99)

## 2021-01-18 LAB — COMPREHENSIVE METABOLIC PANEL
ALT: 11 U/L (ref 0–44)
AST: 16 U/L (ref 15–41)
Albumin: 3.9 g/dL (ref 3.5–5.0)
Alkaline Phosphatase: 66 U/L (ref 38–126)
Anion gap: 9 (ref 5–15)
BUN: 21 mg/dL (ref 8–23)
CO2: 26 mmol/L (ref 22–32)
Calcium: 9.5 mg/dL (ref 8.9–10.3)
Chloride: 102 mmol/L (ref 98–111)
Creatinine, Ser: 1.12 mg/dL — ABNORMAL HIGH (ref 0.44–1.00)
GFR, Estimated: 51 mL/min — ABNORMAL LOW (ref >60)
Glucose, Bld: 129 mg/dL — ABNORMAL HIGH (ref 70–99)
Potassium: 4.2 mmol/L (ref 3.5–5.1)
Sodium: 137 mmol/L (ref 135–145)
Total Bilirubin: 0.9 mg/dL (ref 0.3–1.2)
Total Protein: 7.1 g/dL (ref 6.5–8.1)

## 2021-01-18 LAB — RAPID URINE DRUG SCREEN, HOSP PERFORMED
Amphetamines: NOT DETECTED
Barbiturates: NOT DETECTED
Benzodiazepines: NOT DETECTED
Cocaine: NOT DETECTED
Opiates: NOT DETECTED
Tetrahydrocannabinol: NOT DETECTED

## 2021-01-18 LAB — ETHANOL: Alcohol, Ethyl (B): <10 mg/dL (ref <10)

## 2021-01-18 LAB — APTT: aPTT: 20 seconds — ABNORMAL LOW (ref 24–36)

## 2021-01-18 IMAGING — CT CT ANGIO HEAD-NECK (W OR W/O PERF)
2 of 7 series · 8 of 33 positions shown · IV contrast (omnipaque)
Comparison: None.

CLINICAL DATA: TIA.  Aphasia.

EXAM:
CT ANGIOGRAPHY HEAD AND NECK
TECHNIQUE: Multidetector CT imaging of the head and neck was performed using
the standard protocol during bolus administration of intravenous
contrast. Multiplanar CT image reconstructions and MIPs were
obtained to evaluate the vascular anatomy. Carotid stenosis
measurements (when applicable) are obtained utilizing NASCET
criteria, using the distal internal carotid diameter as the
denominator.
CONTRAST:  75mL OMNIPAQUE IOHEXOL 350 MG/ML SOLN

[Series 5: cta head neck · axial · 0.43mm/px · z∈[+1145,+1261]mm · 2 of 174 slices shown]
[im 58/174  soft-tissue]
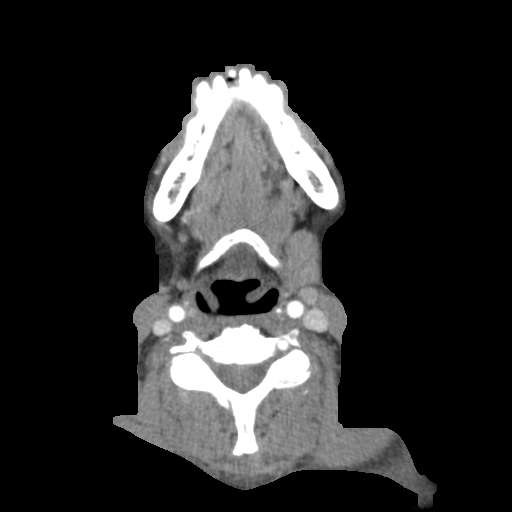
[im 116/174  soft-tissue]
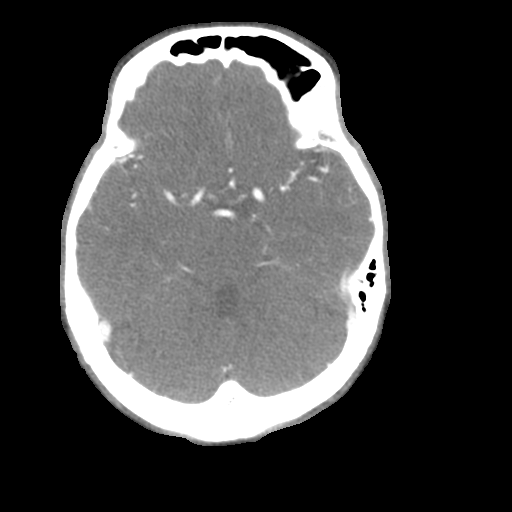

[Series 7: ax thin · axial · 0.33mm/px · z∈[+1081,+1326]mm · 6 of 345 slices shown]
[im 50/345  soft-tissue]
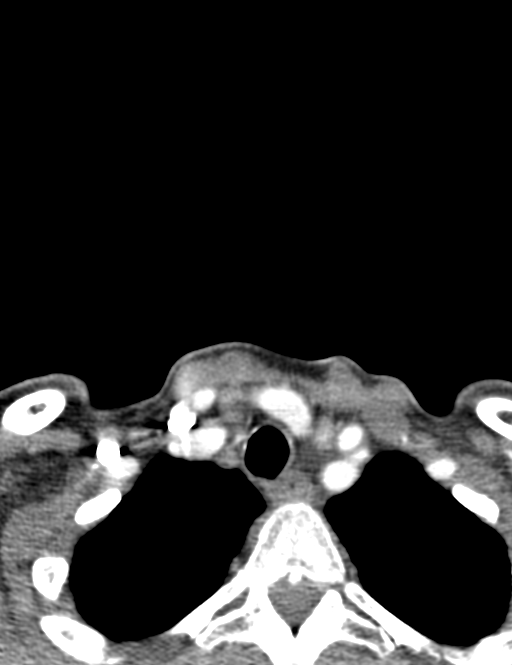
[im 99/345  bone]
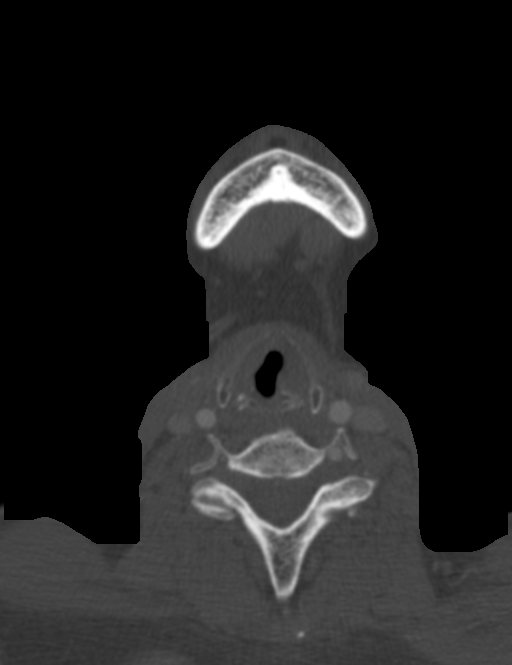
[im 148/345  soft-tissue]
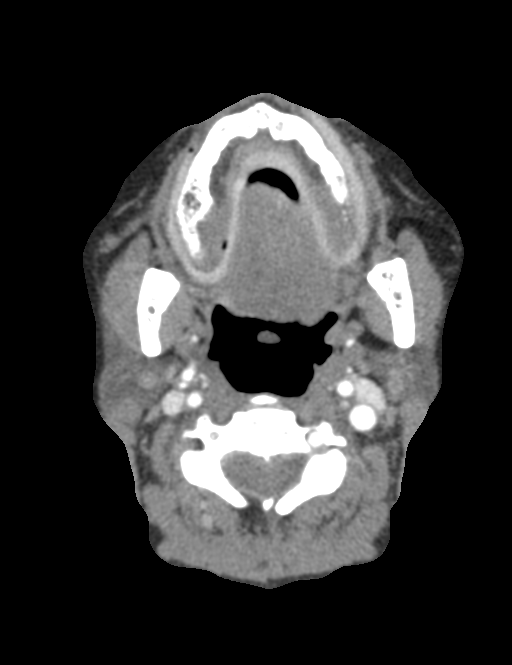
[im 197/345  bone]
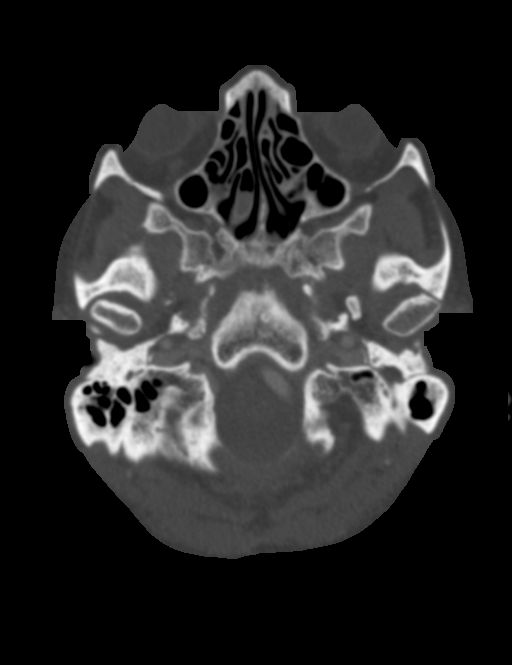
[im 246/345  soft-tissue]
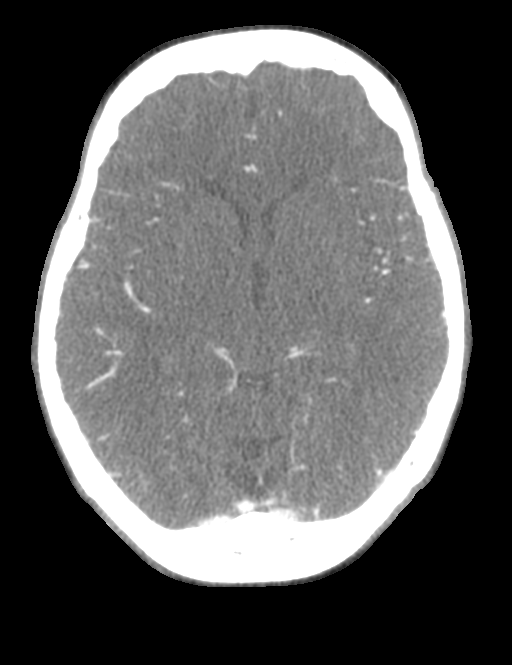
[im 295/345  bone]
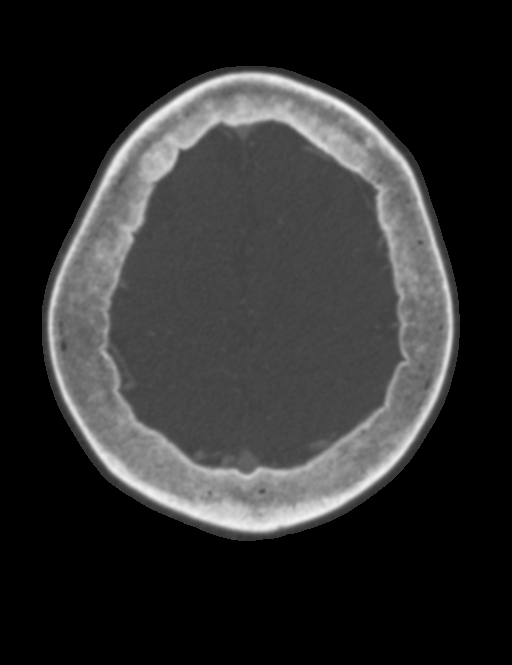

[8 of 33 positions shown; findings below may reference images not displayed]

FINDINGS: CTA NECK FINDINGS

Aortic arch: Normal variant aortic arch branching pattern with
common origin of the brachiocephalic and left common carotid
arteries. Widely patent arch vessel origins.

Right carotid system: Patent without evidence of stenosis,
dissection, or significant atherosclerosis.

Left carotid system: Patent without evidence of stenosis,
dissection, or significant atherosclerosis.

Vertebral arteries: The left vertebral artery is patent and strongly
dominant without evidence of stenosis, dissection, or significant
atherosclerosis. The right vertebral artery is congenitally markedly
hypoplastic and patent through the V1 and V2 segments with the V3
segment poorly visualized due to surrounding veins.

Skeleton: Moderate cervical disc degeneration.

Other neck: Bilateral thyroid nodules measuring up to 1.3 cm for
which no imaging follow-up is recommended. No evidence of cervical
lymphadenopathy.

Upper chest: Clear lung apices.

Review of the MIP images confirms the above findings

CTA HEAD FINDINGS

Anterior circulation: The internal carotid arteries are patent from
skull base to carotid termini with minimal nonstenotic plaque
bilaterally. ACAs and MCAs are patent without evidence of a proximal
branch occlusion or significant proximal stenosis. The right A1
segment is hypoplastic. No aneurysm is identified.

Posterior circulation: The intracranial left vertebral artery is
widely patent and supplies the basilar. The intracranial right
vertebral artery is extremely small and poorly visualized. The
basilar artery is widely patent. Posterior communicating arteries
are diminutive or absent. Both PCAs are patent without evidence of a
significant proximal stenosis. No aneurysm is identified.

Venous sinuses: Patent.

Anatomic variants: Hypoplastic right A1 segment and right vertebral
artery.

Review of the MIP images confirms the above findings
IMPRESSION: Largely unremarkable head and neck CTA. Minimal atherosclerosis
without large vessel occlusion or significant proximal stenosis.

## 2021-01-18 IMAGING — MR MR HEAD W/O CM
10 series · 48 of 48 positions shown · non-contrast
Comparison: Head CT and CTA [DATE]

CLINICAL DATA: Transient ischemic attack (TIA). Speech disturbance.

EXAM:
MRI HEAD WITHOUT CONTRAST
TECHNIQUE: Multiplanar, multiecho pulse sequences of the brain and surrounding
structures were obtained without intravenous contrast.

[Series 5: DWI · axial · 3.0mm · 1.36mm/px · z∈[-28,+113]mm · 9 of 96 slices shown (1 of 2)]
[im 1/96]
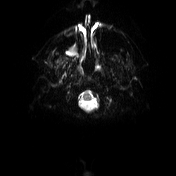
[im 12/96]
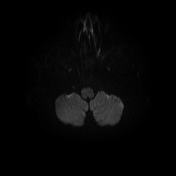
[im 24/96]
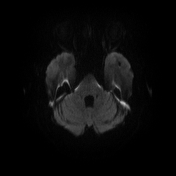
[im 36/96]
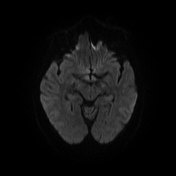
[im 48/96]
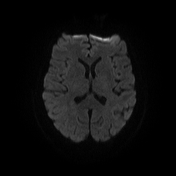
[im 60/96]
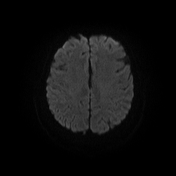
[im 72/96]
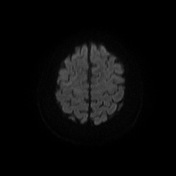
[im 84/96]
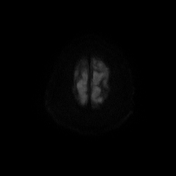
[im 96/96]
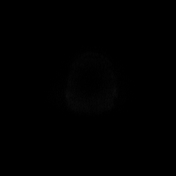

[Series 6: DWI · axial · 3.0mm · 1.36mm/px · z∈[-28,+113]mm · 4 of 48 slices shown (2 of 2)]
[im 1/48]
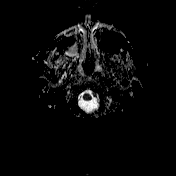
[im 16/48]
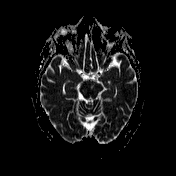
[im 32/48]
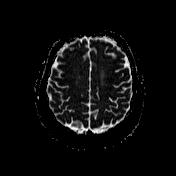
[im 48/48]
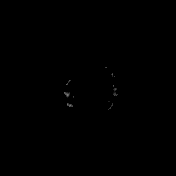

[Series 7: T1 · sagittal · 5.0mm · 0.75mm/px · 2 of 24 slices shown (1 of 2)]
[im 1/24]
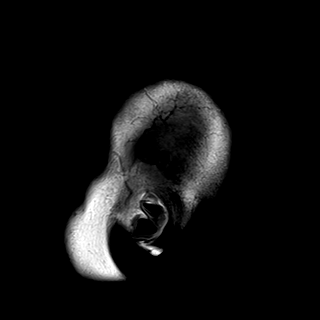
[im 24/24]
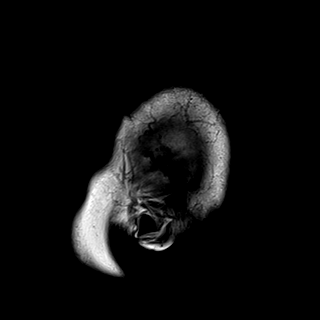

[Series 8: T2 · axial · 5.0mm · 0.62mm/px · z∈[-39,+123]mm · 2 of 26 slices shown (1 of 2)]
[im 1/26]
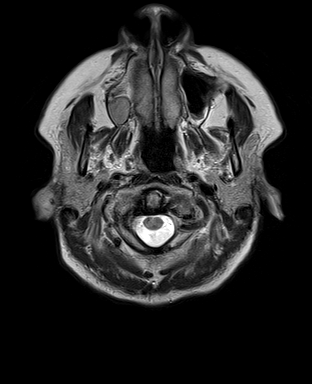
[im 26/26]
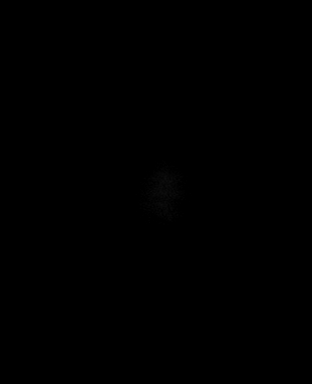

[Series 9: swi_images · axial · 3.0mm · 0.75mm/px · z∈[-40,+124]mm · 5 of 56 slices shown]
[im 1/56]
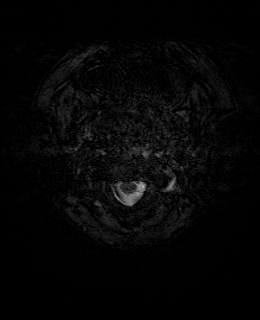
[im 14/56]
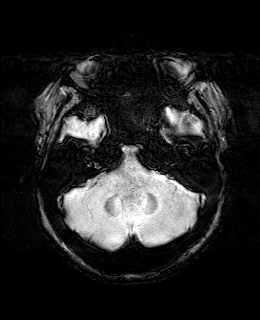
[im 28/56]
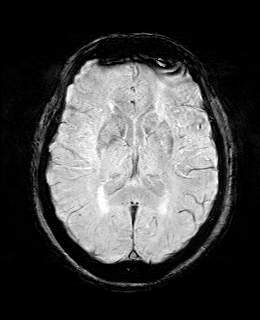
[im 42/56]
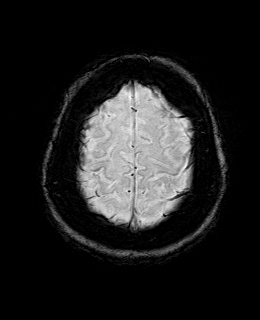
[im 56/56]
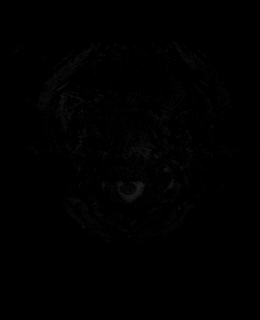

[Series 11: FLAIR · axial · 3.0mm · 0.75mm/px · z∈[-34,+118]mm · 4 of 52 slices shown]
[im 1/52]
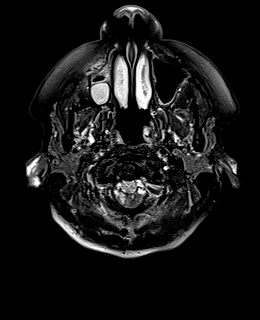
[im 18/52]
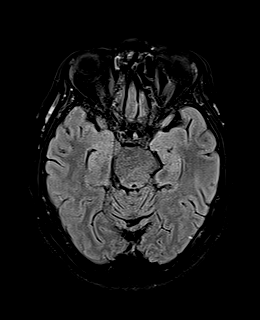
[im 35/52]
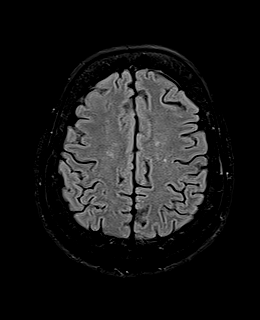
[im 52/52]
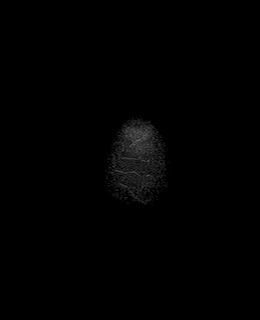

[Series 12: T1 · axial · 1.0mm · 0.94mm/px · z∈[-29,+113]mm · 12 of 144 slices shown (2 of 2)]
[im 1/144]
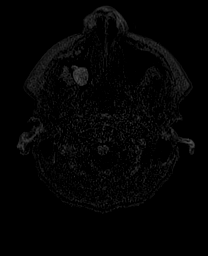
[im 14/144]
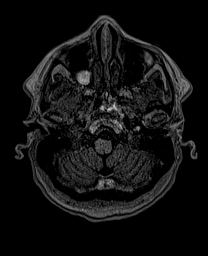
[im 27/144]
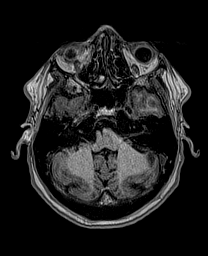
[im 40/144]
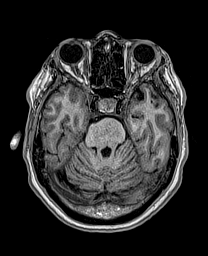
[im 53/144]
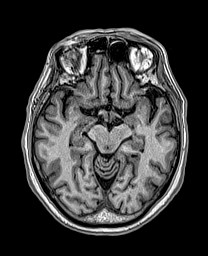
[im 66/144]
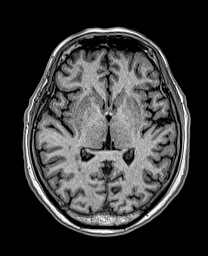
[im 79/144]
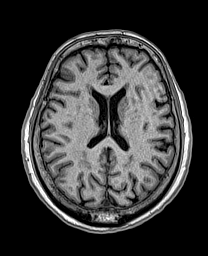
[im 92/144]
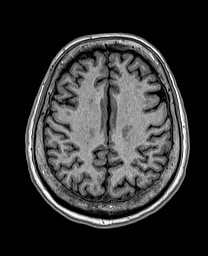
[im 105/144]
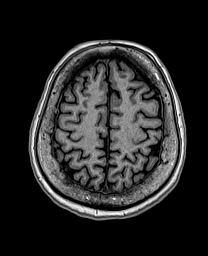
[im 118/144]
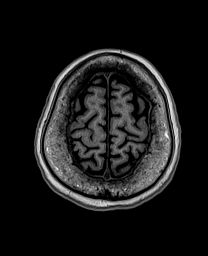
[im 131/144]
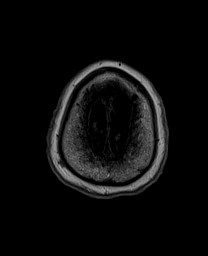
[im 144/144]
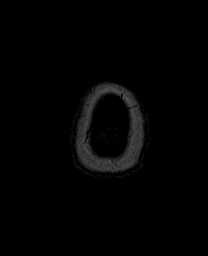

[Series 13: cor dwi_tracew · coronal · 5.0mm · 1.53mm/px · 5 of 56 slices shown]
[im 1/56]
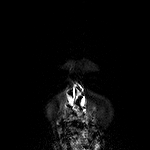
[im 14/56]
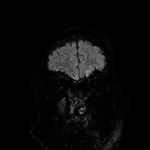
[im 28/56]
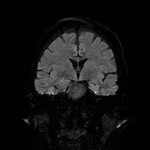
[im 42/56]
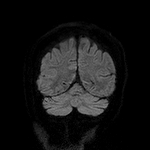
[im 56/56]
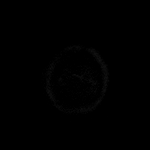

[Series 14: cor dwi_adc · coronal · 5.0mm · 1.53mm/px · 2 of 27 slices shown]
[im 1/27]
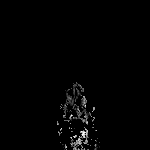
[im 27/27]
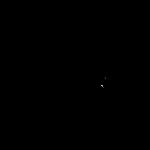

[Series 15: T2 · coronal · 5.0mm · 0.57mm/px · 3 of 32 slices shown (2 of 2)]
[im 1/32]
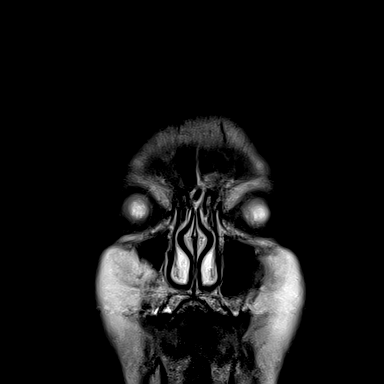
[im 16/32]
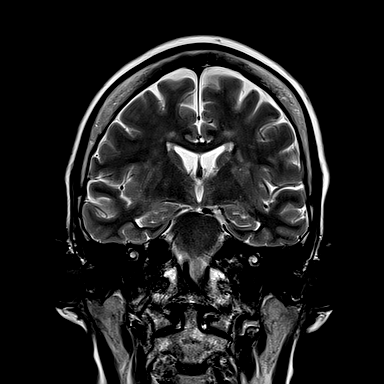
[im 32/32]
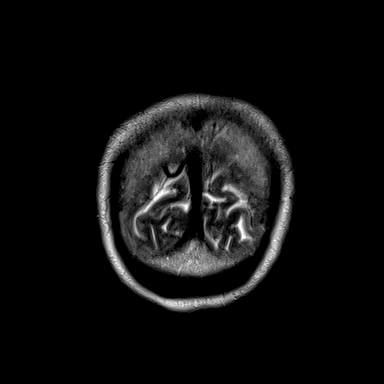

[48 of 48 positions shown; findings below may reference images not displayed]

FINDINGS: Brain: There is no evidence of an acute infarct, mass, midline
shift, or extra-axial fluid collection. T2 hyperintensities in the
cerebral white matter and pons are nonspecific but compatible with
mild chronic small vessel ischemic disease. The ventricles and sulci
are within normal limits for age. There is a subcentimeter focus of
T1 and T2 hypointensity with prominent susceptibility artifact in
the anterior left temporal lobe compatible with chronic blood
products, possibly in a cavernoma. There is no associated edema or
evidence of recent hemorrhage.

Vascular: Poorly visualized distal right vertebral artery, shown to
be hypoplastic on today's CTA. Other major intracranial vascular
flow voids are preserved.

Skull and upper cervical spine: Unremarkable bone marrow signal para

Sinuses/Orbits: Bilateral cataract extraction. Mild right ethmoid
air cell mucosal thickening. Mucous retention cyst in the right
maxillary sinus. Clear mastoid air cells.

Other: None.
IMPRESSION: 1. No acute intracranial abnormality.
2. Mild chronic small vessel ischemic disease.
3. Small focus of chronic blood products in the left temporal lobe,
possibly a cavernoma. No evidence of recent hemorrhage.

## 2021-01-18 IMAGING — CT CT HEAD CODE STROKE
3 series · 16 of 47 positions shown, 19 images · non-contrast
Comparison: None.

CLINICAL DATA: Code stroke.  Aphasia

EXAM:
CT HEAD WITHOUT CONTRAST
TECHNIQUE: Contiguous axial images were obtained from the base of the skull
through the vertex without intravenous contrast.

[Series 3: head wo · axial · 0.47mm/px · z∈[+1241,+1391]mm · 10 of 36 slices shown, 13 images]
[im 3/36  brain]
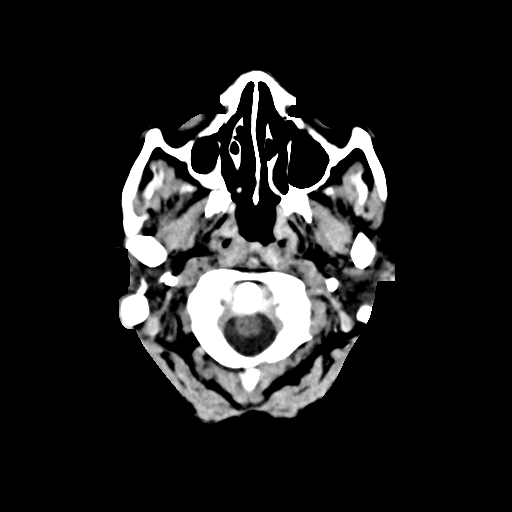
[im 3/36  bone]
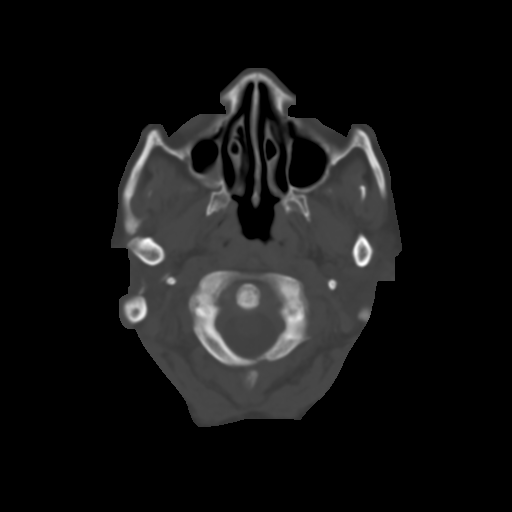
[im 7/36  brain]
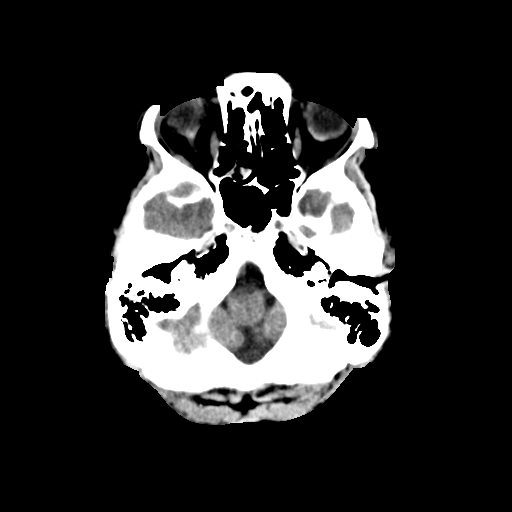
[im 10/36  brain]
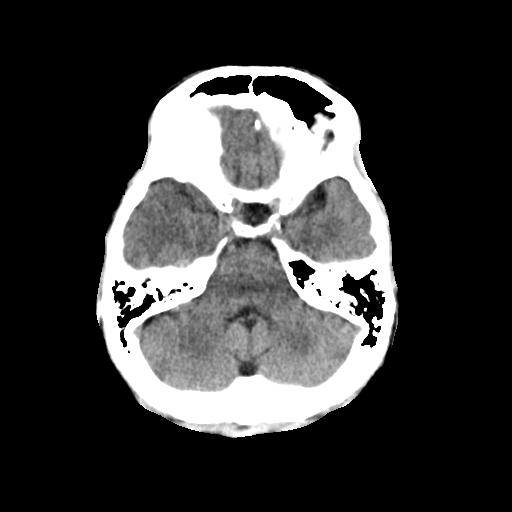
[im 13/36  brain]
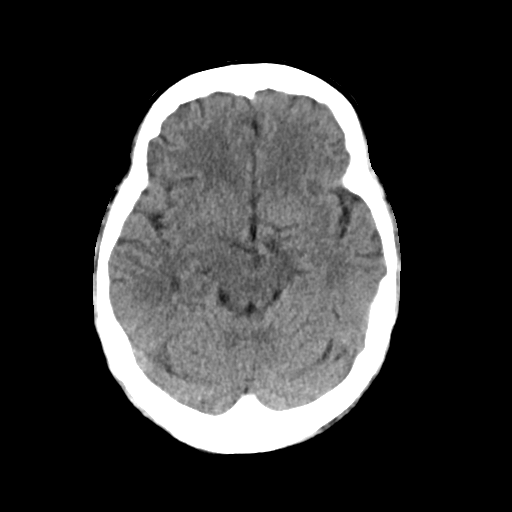
[im 16/36  brain]
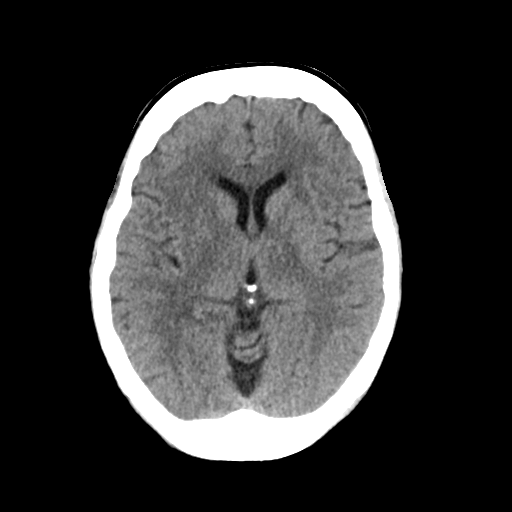
[im 16/36  bone]
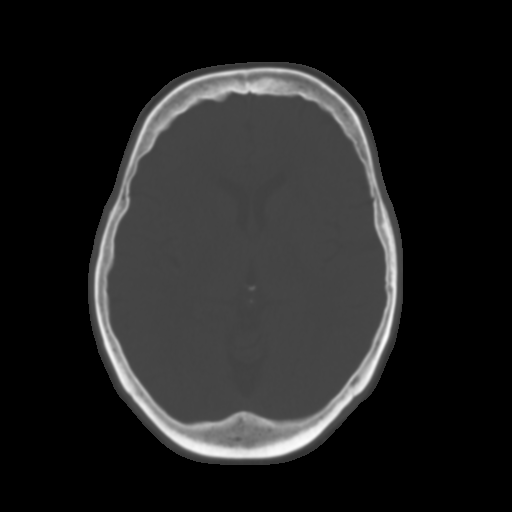
[im 20/36  brain]
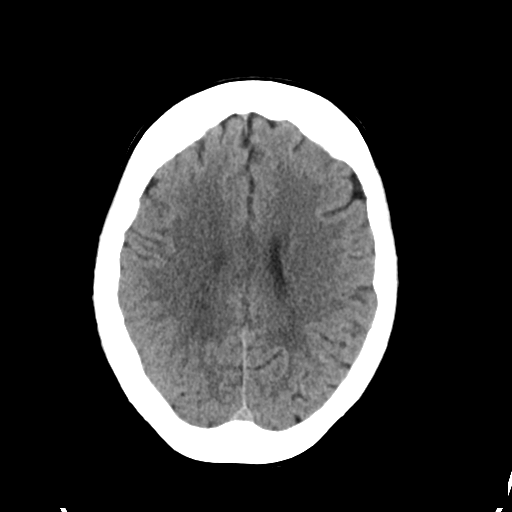
[im 23/36  brain]
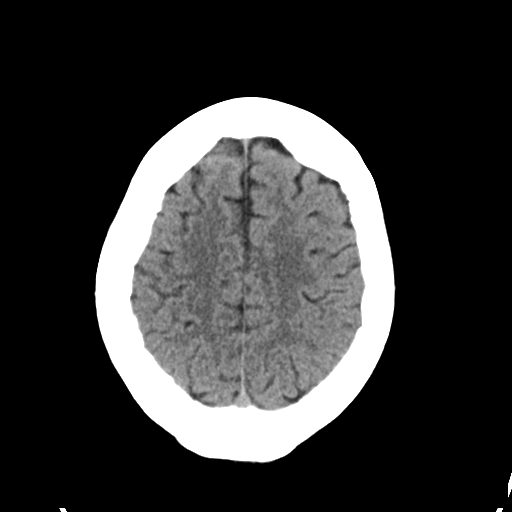
[im 27/36  brain]
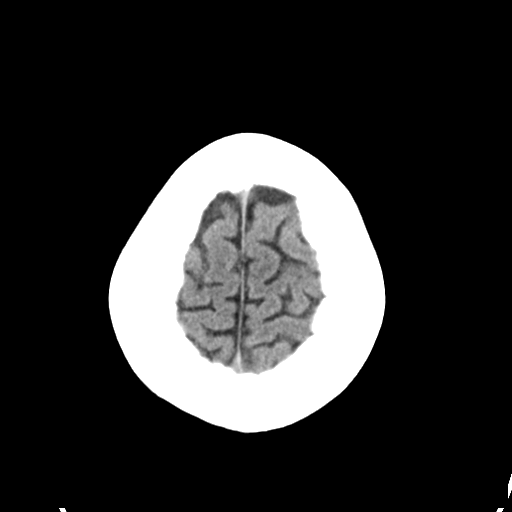
[im 29/36  brain]
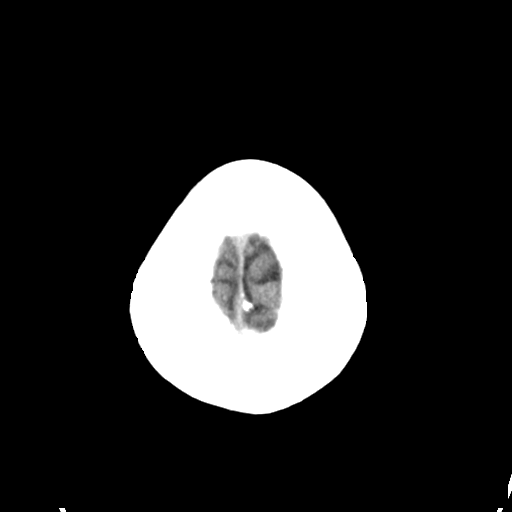
[im 29/36  bone]
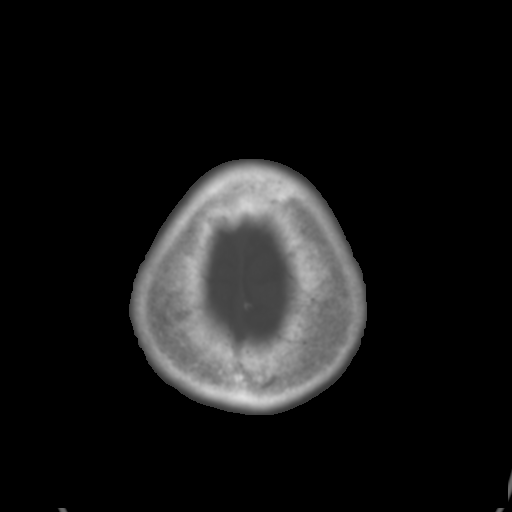
[im 33/36  brain]
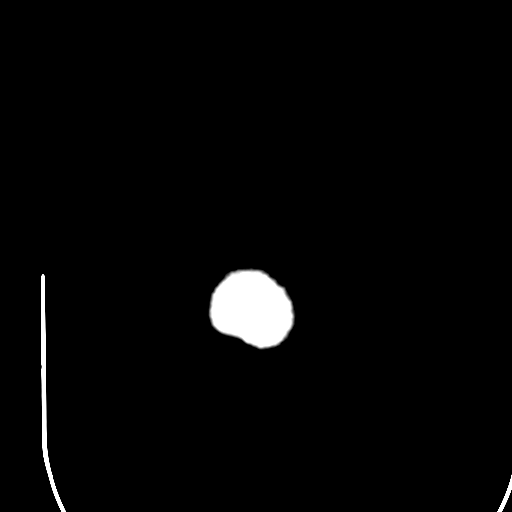

[Series 6: coronal soft tissue · coronal · 0.32mm/px · 3 of 67 slices shown]
[im 23/67  brain]
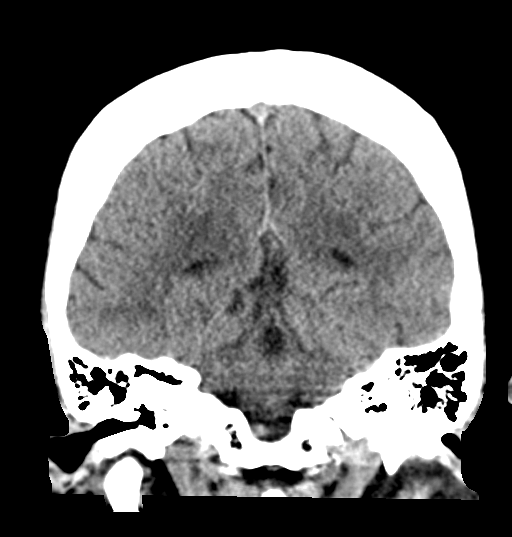
[im 30/67  brain]
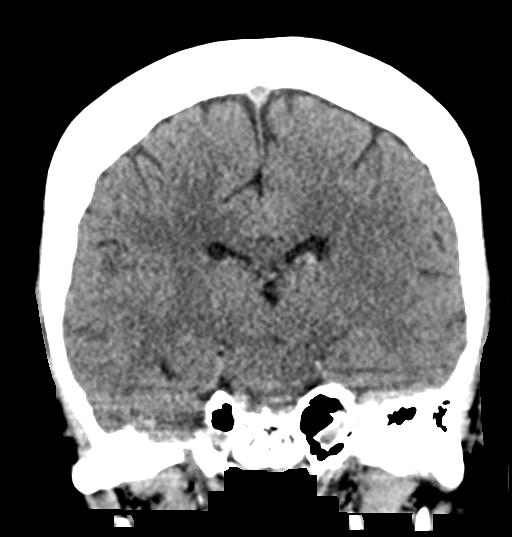
[im 37/67  brain]
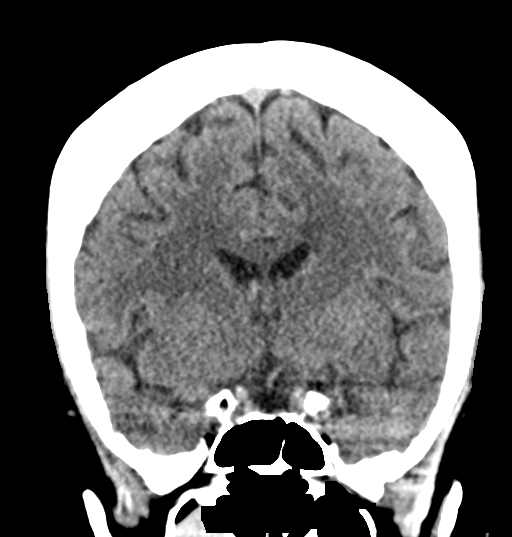

[Series 7: sagittal soft tissue · sagittal · 0.34mm/px · 3 of 56 slices shown]
[im 19/56  brain]
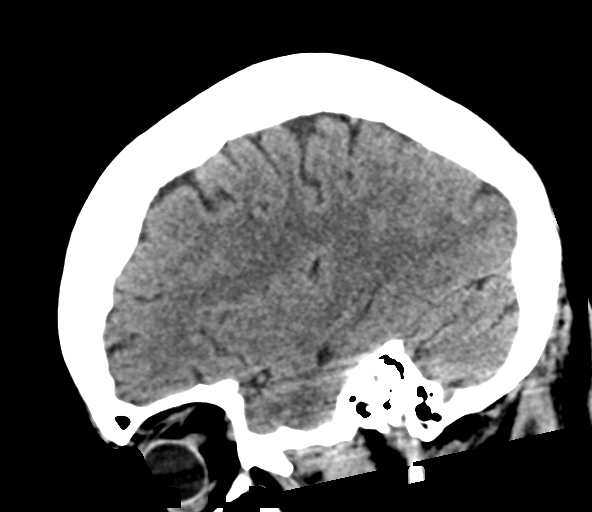
[im 28/56  brain]
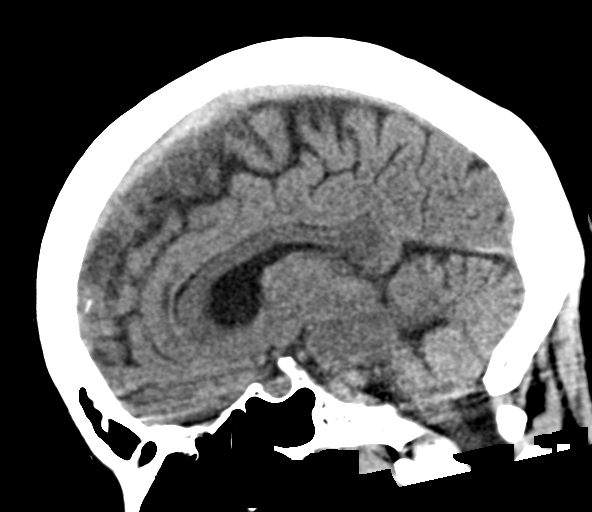
[im 37/56  brain]
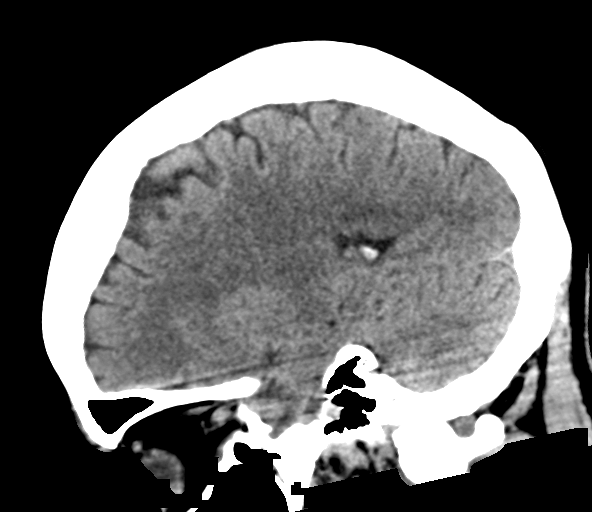

[16 of 47 positions shown; findings below may reference images not displayed]

FINDINGS: Brain: There is no acute intracranial hemorrhage, mass effect, or
edema. Gray-white differentiation is preserved. Ventricles and sulci
are normal in size and configuration. No extra-axial collection.

Vascular: No hyperdense vessel.

Skull: Unremarkable.

Sinuses/Orbits: Mild patchy mucosal thickening. Right maxillary
sinus retention cyst partially imaged.

Other: Mastoid air cells are clear.

ASPECTS ([place] Stroke Program Early CT Score)

- Ganglionic level infarction (caudate, lentiform nuclei, internal
capsule, insula, M1-M3 cortex): 7

- Supraganglionic infarction (M4-M6 cortex): 3

Total score (0-10 with 10 being normal): 10
IMPRESSION: There is no acute intracranial hemorrhage or evidence of acute
infarction. ASPECT score is 10.

These results were called by telephone at the time of interpretation
on [DATE] at [DATE] to provider ROBERTO , who verbally
acknowledged these results.

## 2021-01-18 MED ORDER — ATORVASTATIN CALCIUM 40 MG PO TABS: 40.0000 mg | ORAL_TABLET | Freq: Every day | ORAL | Status: DC

## 2021-01-18 MED ORDER — CLOPIDOGREL BISULFATE 300 MG PO TABS
300.0000 mg | ORAL_TABLET | Freq: Once | ORAL | Status: AC
Start: 2021-01-18 — End: 2021-01-18
  Administered 2021-01-18: 300 mg via ORAL

## 2021-01-18 MED ORDER — IOHEXOL 350 MG/ML SOLN
75.0000 mL | Freq: Once | INTRAVENOUS | Status: AC | PRN
  Administered 2021-01-18: 75 mL via INTRAVENOUS

## 2021-01-18 MED ORDER — CLOPIDOGREL BISULFATE 75 MG PO TABS
75.0000 mg | ORAL_TABLET | Freq: Every day | ORAL | Status: DC
  Administered 2021-01-19: 75 mg via ORAL
  Filled 2021-01-18: qty 1

## 2021-01-18 MED ORDER — SODIUM CHLORIDE 0.9 % IV SOLN
75.0000 mL/h | INTRAVENOUS | Status: DC
  Administered 2021-01-18: 75 mL/h via INTRAVENOUS

## 2021-01-18 MED ORDER — ATORVASTATIN CALCIUM 40 MG PO TABS
40.0000 mg | ORAL_TABLET | Freq: Every day | ORAL | Status: DC
  Administered 2021-01-18 – 2021-01-19 (×2): 40 mg via ORAL
  Filled 2021-01-18: qty 1

## 2021-01-18 MED ORDER — STROKE: EARLY STAGES OF RECOVERY BOOK
Freq: Once | Status: DC
  Filled 2021-01-18: qty 1

## 2021-01-18 MED ORDER — HYDROCODONE-ACETAMINOPHEN 5-325 MG PO TABS: 1.0000 | ORAL_TABLET | ORAL | Status: DC | PRN

## 2021-01-18 MED ORDER — ACETAMINOPHEN 325 MG PO TABS: 650.0000 mg | ORAL_TABLET | Freq: Four times a day (QID) | ORAL | Status: DC | PRN

## 2021-01-18 MED ORDER — INSULIN ASPART 100 UNIT/ML IJ SOLN
0.0000 [IU] | INTRAMUSCULAR | Status: DC
  Filled 2021-01-18: qty 0.09

## 2021-01-18 MED ORDER — ASPIRIN EC 325 MG PO TBEC
325.0000 mg | DELAYED_RELEASE_TABLET | Freq: Once | ORAL | Status: AC
Start: 2021-01-18 — End: 2021-01-18
  Administered 2021-01-18: 325 mg via ORAL
  Filled 2021-01-18: qty 1

## 2021-01-18 MED ORDER — SODIUM CHLORIDE 0.9 % IV SOLN
INTRAVENOUS | Status: DC
Start: 2021-01-18 — End: 2021-01-19

## 2021-01-18 MED ORDER — ACETAMINOPHEN 650 MG RE SUPP: 650.0000 mg | Freq: Four times a day (QID) | RECTAL | Status: DC | PRN

## 2021-01-18 MED ORDER — ASPIRIN EC 81 MG PO TBEC
81.0000 mg | DELAYED_RELEASE_TABLET | Freq: Every day | ORAL | Status: DC
  Administered 2021-01-19: 81 mg via ORAL
  Filled 2021-01-18: qty 1

## 2021-01-18 NOTE — ED Triage Notes (Signed)
Patient's husband reports that the patient noted at 1515 that her speech is off. Patient states she still feels like she can not get her words out. Patient states somebetter ,but using the wrong words and delay of words when speaking.

## 2021-01-18 NOTE — H&P (Signed)
[name] PTW:[id number] DOB: 11/03/1945 DOA: 01/18/2021   PCP: [facility]   Outpatient Specialists:    endocrine Dr.Kerr  Patient arrived to ER on 01/18/21 at 68 Referred by Attending Horton, Alvin Critchley, DO   Patient coming from: home Lives   With family    Chief Complaint:   Chief Complaint  Patient presents with   Aphasia    HPI: Caroline Blair is a 75 y.o. female with medical history significant of DM2, HTN HLD    Presented with  was sewing at home around 3:15 PM she had acute onset speech difficulty, husband cannot understand what she was talking and patient not  able to understand her husband.  she was confused ,no facial droopSymptoms lasted about 20 to 25 minutes and improved.   No CP no SOb, no feverno chills, Has a headache, for thepast few days have been having headcahes and BP has been up she thouhti was her sinuses No melena no black stool schronic anemia no oblood transfusion  Has  been vaccinated against COVID  and boosted X3 Had flu shot   Initial COVID TEST  NEGATIVE  Lab Results  Component Value Date   [place] NEGATIVE 01/18/2021     Regarding pertinent Chronic problems:    Hyperlipidemia -  on statins lipitor Lipid Panel  No results found for: CHOL, TRIG, HDL, CHOLHDL, VLDL, LDLCALC, LDLDIRECT, LABVLDL  HTN on losartan  DM 2 -  on PO meds only   Chronic anemia - baseline hg Hemoglobin & Hematocrit  Recent Labs    07/27/20 0854 01/18/21 1620 01/18/21 1641  HGB 11.5* 11.3* 12.6      While in ER: CTA neg neurology asked for TIA work up MRI neg for CVA    ED Triage Vitals  Enc Vitals Group     BP 01/18/21 1553 (!) 183/99     Pulse Rate 01/18/21 1553 (!) 106     Resp 01/18/21 1553 18     Temp 01/18/21 1553 98 F (36.7 C)     Temp Source 01/18/21 1553 Oral     SpO2 01/18/21 1553 100 %     Weight --      Height 01/18/21 1612 5\' 9"  (1.753 m)     Head Circumference --      Peak Flow --       Pain Score --      Pain Loc --      Pain Edu? --      Excl. in [place]? --   TMAX(24)@     _________________________________________ Significant initial  Findings: Abnormal Labs Reviewed  APTT - Abnormal; Notable for the following components:      Result Value   aPTT 20 (*)    All other components within normal limits  CBC - Abnormal; Notable for the following components:   Hemoglobin 11.3 (*)    HCT 35.3 (*)    All other components within normal limits  URINALYSIS, ROUTINE W REFLEX MICROSCOPIC - Abnormal; Notable for the following components:   Color, Urine STRAW (*)    Ketones, ur 5 (*)    All other components within normal limits  I-STAT CHEM 8, ED - Abnormal; Notable for the following components:   BUN 29 (*)    Creatinine, Ser 1.20 (*)    Glucose, Bld 100 (*)    All other components within normal limits   ____________________________________________ Ordered CT HEAD   NON acute CTA Largely unremarkable  head and neck CTA.  CXR -  NON acute    ECG: Ordered Personally reviewed by me showing: HR : 75 Rhythm:  NSR,    no evidence of ischemic changes QTC 442   The recent clinical data is shown below. Vitals:   01/18/21 1553 01/18/21 1612 01/18/21 1700  BP: (!) 183/99  (!) 156/79  Pulse: (!) 106  72  Resp: 18  18  Temp: 98 F (36.7 C)    TempSrc: Oral    SpO2: 100%  100%  Height:  5\' 9"  (1.753 m)     WBC     Component Value Date/Time   WBC 6.3 01/18/2021 1620   LYMPHSABS 1.8 01/18/2021 1620   MONOABS 0.6 01/18/2021 1620   EOSABS 0.1 01/18/2021 1620   BASOSABS 0.0 01/18/2021 1620       UA  no evidence of UTI    Urine analysis:    Component Value Date/Time   COLORURINE STRAW (A) 01/18/2021 1620   APPEARANCEUR CLEAR 01/18/2021 1620   LABSPEC 1.006 01/18/2021 1620   [place] 7.0 01/18/2021 [place] 01/18/2021 [place] 01/18/2021 [place] 01/18/2021 1620   KETONESUR 5 (A) 01/18/2021 1620   PROTEINUR  NEGATIVE 01/18/2021 1620   NITRITE NEGATIVE 01/18/2021 1620   LEUKOCYTESUR NEGATIVE 01/18/2021 1620    Results for orders placed or performed during the hospital encounter of 01/18/21  Resp Panel by RT-PCR (Flu A&B, Covid) Nasopharyngeal Swab     Status: None   Collection Time: 01/18/21  4:52 PM   Specimen: Nasopharyngeal Swab; Nasopharyngeal(NP) swabs in vial transport medium  Result Value Ref Range Status   SARS Coronavirus 2 by RT PCR NEGATIVE NEGATIVE Final         Influenza A by PCR NEGATIVE NEGATIVE Final   Influenza B by PCR NEGATIVE NEGATIVE Final           _______________________________________________________ ER Provider Called:  Neurology    Dr. Erlinda Hong They Recommend admit to medicine  Will see in AM    _______________________________________________ Hospitalist was called for admission for TIA  The following Work up has been ordered so far:  Orders Placed This Encounter  Procedures   Resp Panel by RT-PCR (Flu A&B, Covid) Nasopharyngeal Swab   CT HEAD CODE STROKE WO CONTRAST   CT ANGIO HEAD NECK W WO CM   MR BRAIN WO CONTRAST   Ethanol   Protime-INR   APTT   CBC   Differential   Urine rapid drug screen (hosp performed)   Urinalysis, Routine w reflex microscopic   Lipid panel   Hemoglobin A1c   Comprehensive metabolic panel   Diet NPO time specified   Vital signs   Cardiac Monitoring   Modified Stroke Scale (mNIHSS) Document mNIHSS assessment every 2 hours for a total of 12 hours   Stroke swallow screen   Activate teleneurology   Initiate Carrier Fluid Protocol   If O2 sat If O2 Sat < 94%, administer O2 at 2 liters/minute via nasal cannula.   Vital signs every 2 hours x 12 hours, then every 4 hours   Notify physician (specify)   Stroke swallow screen - If patient does NOT pass this screen, place order for SLP eval and treat (SLP2) - swallowing evaluation (BSE, MBS and/or diet order as indicated)   RN to notify Physician for appropriate diet after patient  passes stroke swallow screen   Modified Stroke Scale (mNIHSS) every 2 hours x 12  hours then every 4 hours   NIH stroke scale   Discuss with patient and document patient's goals for stroke risk factor reduction   Initiate Oral Care Protocol   Initiate Carrier Fluid Protocol   Provide stroke education material to patient and family.   Nurse to provide smoking / tobacco cessation education   Cardiac Monitoring Continuous x 48 hours Indications for use: Acute neurological event   Clerk to Activate Code Stroke   Consult to hospitalist   OT eval and treat   PT eval and treat   Pulse oximetry, continuous   Pulse Oximetry Pulse Oximetry check every 2 hours X 12 hours, then every 4 hours.   Oxygen therapy Mode or (Route): Nasal cannula; Liters Per Minute: 2; Keep 02 saturation: greater than 94 %   SLP eval and treat Reason for evaluation: Cognitive/Language evaluation   CBG monitoring, ED   I-stat chem 8, ED   ED EKG   EKG 12-Lead   EKG 12-Lead   ECHOCARDIOGRAM COMPLETE   EEG adult   Saline lock IV   Fall precautions    Following Medications were ordered in ER: Medications  clopidogrel (PLAVIX) tablet 300 mg (300 mg Oral Given 01/18/21 1718)    Followed by  clopidogrel (PLAVIX) tablet 75 mg (has no administration in time range)  aspirin EC tablet 325 mg (325 mg Oral Given 01/18/21 1718)    Followed by  aspirin EC tablet 81 mg (has no administration in time range)  atorvastatin (LIPITOR) tablet 40 mg (40 mg Oral Given 01/18/21 1718)  0.9 %  sodium chloride infusion ( Intravenous New Bag/Given 01/18/21 1734)   stroke: mapping our early stages of recovery book (has no administration in time range)  iohexol (OMNIPAQUE) 350 MG/ML injection 75 mL (75 mLs Intravenous Contrast Given 01/18/21 1722)        Consult Orders  (From admission, onward)           Start     Ordered   01/18/21 1849  Consult to hospitalist  Once       Provider:  (Not yet assigned)  Question Answer Comment   Place call to: [facility]   Reason for Consult Admit      01/18/21 1848   01/18/21 1701  PT eval and treat  Routine        01/18/21 1701   01/18/21 1701  OT eval and treat  Routine        01/18/21 1701   01/18/21 1701  SLP eval and treat Reason for evaluation: Cognitive/Language evaluation  Once       Question:  Reason for evaluation  Answer:  Cognitive/Language evaluation   01/18/21 1701             OTHER Significant initial  Findings:  labs showing:   Recent Labs  Lab 01/18/21 1641  NA 139  K 4.4  GLUCOSE 100*  BUN 29*  CREATININE 1.20*    Cr    Up from baseline see below Lab Results  Component Value Date   CREATININE 1.20 (H) 01/18/2021   CREATININE 1.02 (H) 07/27/2020    No results for input(s): AST, ALT, ALKPHOS, BILITOT, PROT, ALBUMIN in the last 168 hours. Lab Results  Component Value Date   CALCIUM 9.7 07/27/2020        Plt: Lab Results  Component Value Date   PLT 363 01/18/2021        Recent Labs  Lab 01/18/21 1620 01/18/21 1641  WBC 6.3  --  NEUTROABS 3.7  --   HGB 11.3* 12.6  HCT 35.3* 37.0  MCV 88.3  --   PLT 363  --     HG/HCT stable,       Component Value Date/Time   HGB 12.6 01/18/2021 1641   HCT 37.0 01/18/2021 1641   MCV 88.3 01/18/2021 1620     DM  labs:  HbA1C: No results for input(s): HGBA1C in the last 8760 hours.     CBG (last 3)  Recent Labs    01/18/21 1610  GLUCAP 79        Cultures: No results found for: SDES, SPECREQUEST, CULT, REPTSTATUS   Radiological Exams on Admission: CT ANGIO HEAD NECK W WO CM  Result Date: 01/18/2021 CLINICAL DATA:  TIA.  Aphasia. EXAM: CT ANGIOGRAPHY HEAD AND NECK TECHNIQUE: Multidetector CT imaging of the head and neck was performed using the standard protocol during bolus administration of intravenous contrast. Multiplanar CT image reconstructions and MIPs were obtained to evaluate the vascular anatomy. Carotid stenosis measurements (when applicable) are obtained  utilizing NASCET criteria, using the distal internal carotid diameter as the denominator. CONTRAST:  72mL OMNIPAQUE IOHEXOL 350 MG/ML SOLN COMPARISON:  None. FINDINGS: CTA NECK FINDINGS Aortic arch: Normal variant aortic arch branching pattern with common origin of the brachiocephalic and left common carotid arteries. Widely patent arch vessel origins. Right carotid system: Patent without evidence of stenosis, dissection, or significant atherosclerosis. Left carotid system: Patent without evidence of stenosis, dissection, or significant atherosclerosis. Vertebral arteries: The left vertebral artery is patent and strongly dominant without evidence of stenosis, dissection, or significant atherosclerosis. The right vertebral artery is congenitally markedly hypoplastic and patent through the V1 and V2 segments with the V3 segment poorly visualized due to surrounding veins. Skeleton: Moderate cervical disc degeneration. Other neck: Bilateral thyroid nodules measuring up to 1.3 cm for which no imaging follow-up is recommended. No evidence of cervical lymphadenopathy. Upper chest: Clear lung apices. Review of the MIP images confirms the above findings CTA HEAD FINDINGS Anterior circulation: The internal carotid arteries are patent from skull base to carotid termini with minimal nonstenotic plaque bilaterally. ACAs and MCAs are patent without evidence of a proximal branch occlusion or significant proximal stenosis. The right A1 segment is hypoplastic. No aneurysm is identified. Posterior circulation: The intracranial left vertebral artery is widely patent and supplies the basilar. The intracranial right vertebral artery is extremely small and poorly visualized. The basilar artery is widely patent. Posterior communicating arteries are diminutive or absent. Both PCAs are patent without evidence of a significant proximal stenosis. No aneurysm is identified. Venous sinuses: Patent. Anatomic variants: Hypoplastic right A1  segment and right vertebral artery. Review of the MIP images confirms the above findings IMPRESSION: Largely unremarkable head and neck CTA. Minimal atherosclerosis without large vessel occlusion or significant proximal stenosis. Electronically Signed   By: Logan Bores M.D.   On: 01/18/2021 17:59   MR BRAIN WO CONTRAST  Result Date: 01/18/2021 CLINICAL DATA:  Transient ischemic attack (TIA). Speech disturbance. EXAM: MRI HEAD WITHOUT CONTRAST TECHNIQUE: Multiplanar, multiecho pulse sequences of the brain and surrounding structures were obtained without intravenous contrast. COMPARISON:  Head CT and CTA 01/18/2021 FINDINGS: Brain: There is no evidence of an acute infarct, mass, midline shift, or extra-axial fluid collection. T2 hyperintensities in the cerebral white matter and pons are nonspecific but compatible with mild chronic small vessel ischemic disease. The ventricles and sulci are within normal limits for age. There is a subcentimeter focus of T1 and T2  hypointensity with prominent susceptibility artifact in the anterior left temporal lobe compatible with chronic blood products, possibly in a cavernoma. There is no associated edema or evidence of recent hemorrhage. Vascular: Poorly visualized distal right vertebral artery, shown to be hypoplastic on today's CTA. Other major intracranial vascular flow voids are preserved. Skull and upper cervical spine: Unremarkable bone marrow signal para Sinuses/Orbits: Bilateral cataract extraction. Mild right ethmoid air cell mucosal thickening. Mucous retention cyst in the right maxillary sinus. Clear mastoid air cells. Other: None. IMPRESSION: 1. No acute intracranial abnormality. 2. Mild chronic small vessel ischemic disease. 3. 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 No evidence of recent hemorrhage. Electronically Signed   By: Logan Bores M.D.   On: 01/18/2021 18:52   DG CHEST PORT 1 VIEW  Result Date:  01/18/2021 CLINICAL DATA:  TIA EXAM: PORTABLE CHEST 1 VIEW COMPARISON:  Chest x-ray 07/27/2020 FINDINGS: The heart size and mediastinal contours are within normal limits. Both lungs are clear. The visualized skeletal structures are unremarkable. IMPRESSION: No active disease. Electronically Signed   By: Ronney Asters M.D.   On: 01/18/2021 20:08   CT HEAD CODE STROKE WO CONTRAST  Result Date: 01/18/2021 CLINICAL DATA:  Code stroke.  Aphasia EXAM: CT HEAD WITHOUT CONTRAST TECHNIQUE: Contiguous axial images were obtained from the base of the skull through the vertex without intravenous contrast. COMPARISON:  None. FINDINGS: Brain: There is no acute intracranial hemorrhage, mass effect, or edema. Gray-white differentiation is preserved. Ventricles and sulci are normal in size and configuration. No extra-axial collection. Vascular: No hyperdense vessel. Skull: Unremarkable. Sinuses/Orbits: Mild patchy mucosal thickening. Right maxillary sinus retention cyst partially imaged. Other: Mastoid air cells are clear. ASPECTS ([place] Stroke Program Early CT Score) - Ganglionic level infarction (caudate, lentiform nuclei, internal capsule, insula, M1-M3 cortex): 7 - Supraganglionic infarction (M4-M6 cortex): 3 Total score (0-10 with 10 being normal): 10 IMPRESSION: There is no acute intracranial hemorrhage or evidence of acute infarction. ASPECT score is 10. These results were called by telephone at the time of interpretation on 01/18/2021 at 4:33 pm to provider [place] , who verbally acknowledged these results. Electronically Signed   By: Macy Mis M.D.   On: 01/18/2021 16:35   _______________________________________________________________________________________________________ Latest   Blood pressure (!) 156/79, pulse 72, temperature 98 F (36.7 C), temperature source Oral, resp. rate 18, height 5\' 9"  (1.753 m), SpO2 100 %.   Review of Systems:    Pertinent positives include:   slurred speech  confusion  Constitutional:  No weight loss, night sweats, Fevers, chills, fatigue, weight loss  HEENT:  No headaches, Difficulty swallowing,Tooth/dental problems,Sore throat,  No sneezing, itching, ear ache, nasal congestion, post nasal drip,  Cardio-vascular:  No chest pain, Orthopnea, PND, anasarca, dizziness, palpitations.no Bilateral lower extremity swelling  GI:  No heartburn, indigestion, abdominal pain, nausea, vomiting, diarrhea, change in bowel habits, loss of appetite, melena, blood in stool, hematemesis Resp:  no shortness of breath at rest. No dyspnea on exertion, No excess mucus, no productive cough, No non-productive cough, No coughing up of blood.No change in color of mucus.No wheezing. Skin:  no rash or lesions. No jaundice GU:  no dysuria, change in color of urine, no urgency or frequency. No straining to urinate.  No flank pain.  Musculoskeletal:  No joint pain or no joint swelling. No decreased range of motion. No back pain.  Psych:  No change in mood or affect. No depression or anxiety. No memory loss.  Neuro: no localizing neurological complaints, no tingling, no weakness, no double vision, no gait abnormality, no  All systems reviewed and apart from [place] all are negative _______________________________________________________________________________________________ Past Medical History:   Past Medical History:  Diagnosis Date   Anemia    Cataract    Diabetes ([place])    Heart murmur    Hypertension       Past Surgical History:  Procedure Laterality Date   DILATION AND CURETTAGE OF UTERUS  2006    Social History:  Ambulatory   independently      reports that she has never smoked. She has never used smokeless tobacco. She reports current alcohol use. She reports that she does not use drugs.     Family History:   Family History  Problem Relation Age of Onset   Diabetes Father    Colon cancer Neg Hx     ______________________________________________________________________________________________ Allergies: No Known Allergies   Prior to Admission medications   Medication Sig Start Date End Date Taking? Authorizing Provider  Acetaminophen (TYLENOL EXTRA STRENGTH PO) Take 500 mg by mouth as needed (arthritis pain).   Yes [provider]  aspirin 500 MG tablet Take 500 mg by mouth every 6 (six) hours as needed for pain.   Yes [provider]  aspirin 81 MG tablet Take 81 mg by mouth daily.   Yes [provider]  atorvastatin (LIPITOR) 40 MG tablet Take 40 mg by mouth daily.   Yes [provider]  Cyanocobalamin (VITAMIN B 12 PO) Take 1 tablet by mouth daily.   Yes [provider]  glipiZIDE (GLUCOTROL XL) 5 MG 24 hr tablet Take 5 mg by mouth daily.   Yes [provider]  losartan (COZAAR) 100 MG tablet Take 100 mg by mouth daily. 09/19/18  Yes [provider]  metFORMIN (GLUCOPHAGE) 1000 MG tablet Take 1,000 mg by mouth 2 (two) times daily with a meal. 09/19/18  Yes [provider]  furosemide (LASIX) 20 MG tablet Take 10 mg by mouth daily. Patient not taking: Reported on 01/18/2021 11/23/20   [provider]  hydrochlorothiazide (HYDRODIURIL) 25 MG tablet Take 25 mg by mouth daily. Patient not taking: Reported on 01/18/2021 09/19/18   [provider]  hydrOXYzine (ATARAX/VISTARIL) 25 MG tablet Take 1 Vistaril every 8-12 hours for stress and anxiety Patient not taking: Reported on 01/18/2021 07/27/20   Milton Ferguson, MD  losartan-hydrochlorothiazide (HYZAAR) 100-25 MG per tablet Take 1 tablet by mouth daily. Patient not taking: Reported on 01/18/2021    [provider]  sucralfate (CARAFATE) 1 g tablet Take 1 g by mouth 2 (two) times daily. Patient not taking: Reported on 01/18/2021 01/11/21   [provider]     ___________________________________________________________________________________________________ Physical Exam: Vitals with BMI 01/18/2021 01/18/2021 01/18/2021  Height - 5\' 9"  -  Weight - - -  BMI - - -  Systolic 580 - 998  Diastolic 79 - 99  Pulse 72 - 106     1. General:  in No  Acute distress   well   -appearing 2. Psychological: Alert and   Oriented 3. Head/ENT: Dry Mucous Membranes                          Head Non traumatic, neck supple                           Poor Dentition 4. SKIN: normal  Skin turgor,  Skin clean Dry and intact no rash 5. Heart: Regular rate and rhythm systolic  Murmur, no Rub or gallop 6. Lungs: no wheezes or crackles   7. Abdomen: Soft,  non-tender, Non distended  bowel sounds present 8. Lower extremities: no clubbing, cyanosis, no  edema 9. Neurologically  strength 5 out of 5 in all 4 extremities cranial nerves II through XII intact 10. MSK: Normal range of motion    Chart has been reviewed  ______________________________________________________________________________________________  Assessment/Plan   75 y.o. female with medical history significant of DM2, HTN HLD      Admitted for TIA  Present on Admission:  TIA (transient ischemic attack) -  - will admit based on TIA/CVA protocol,        Monitor on Tele       MRA/MRI  Resulted - showing no acute ischemic CVA        CTA  neg,         Echo to evaluate for possible embolic source,        obtain cardiac enzymes,  ECG,   Lipid panel, TSH.        Order PT/OT evaluation.        keep nothing by mouth until passes swallow eval  Speech pathology evaluation       Will make sure patient is on antiplatelet  Plavix agent and statin        Allow permissive Hypertension keep BP <220/120        Neurology consulted   will see in AM   Heart murmur -chronic stable   Hypertension - allow permissive HTN   AKI (acute kidney injury) ([place]) - rehydrate, obtain urine elctrolytes  DM2 -  -  Order Sensitive   SSI    -  check TSH and HgA1C  - Hold by mouth medications    Other plan as per orders.  DVT prophylaxis:  SCD      Code Status:    Code Status: Not on file FULL CODE  as per patient   I had personally discussed CODE STATUS with patient      Family Communication:   Family   at  Bedside  plan of care was discussed  with  Daughter   Disposition Plan:   To home once workup is complete and patient is stable   Following barriers for discharge:                            Electrolytes corrected                                              Will need consultants to evaluate patient prior to discharge                      Would benefit from PT/OT eval prior to DC  Ordered                   Swallow eval - SLP ordered                                       Consults called: Neurology is aware    Admission status:  ED Disposition  ED Disposition  Admit   Condition  --   Comment  Hospital Area: [facility] [[id number]]  Level of Care: Telemetry [5]  Admit to tele based on following criteria: Other see comments  Comments: tia  May place patient in observation at [facility] or [place] if equivalent level of care is available:: Yes  Covid Evaluation: Asymptomatic Screening Protocol (No Symptoms)  Diagnosis: TIA (transient ischemic attack) [130865]  Admitting Physician: Toy Baker [[id number]]  Attending Physician: Toy Baker [[id number]]           Obs    Level of care     tele    indefinitely please discontinue once patient no longer qualifies COVID-19 Labs    Lab Results  Component Value Date   [place] 01/18/2021     Precautions: admitted as   Covid Negative    PPE: Used by the provider:   N95  eye Goggles,  Gloves      [place] 01/18/2021, 8:18 PM    [facility]     after 2 AM please page floor coverage PA If 7AM-7PM, please contact the day team taking care of the patient  using [email or web address]   Patient was evaluated in the context of the global COVID-19 pandemic, which necessitated consideration that the patient might be at risk for infection with the SARS-CoV-2 virus that causes COVID-19. Institutional protocols and algorithms that pertain to the evaluation of patients at risk for COVID-19 are in a state of rapid change based on information released by regulatory bodies including the CDC and federal and state organizations. These policies and algorithms were followed during the patient's care.

## 2021-01-18 NOTE — Consult Note (Signed)
[facility] Telemedicine Consult   Requesting Provider: Dr. 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  Per patient and husband, patient was sewing at home around 3:15 PM she had acute onset speech difficulty, husband cannot understand what she was talking but patient still able to understand her husband.  Symptoms lasted about 20 to 25 minutes and improved.  ER, patient speech much improved, although husband stated that patient speech still not as " clear" as normal.  Denies any vision changes, arm or leg weakness, loss of consciousness or shaking jerking.  Patient complaining of slight headache but very mild.  He denied any history of migraine.  BP with EMS 183/99, ER 180/79.  Glucose 79.  On my examination, patient may have intermittent hesitation of words and some word finding difficulty but very mild, able to name and repeat and describe pictures without difficulty.  NIH score 1 at the most.  Patient stated that she takes aspirin 81 at home but did not take today.   LKW: 3:15 PM tpa given?: No, too mild and nondisabling symptoms IR Thrombectomy? No, low NIH score Modified Rankin Scale: 0-Completely asymptomatic and back to baseline post- stroke   Exam: Vitals:   01/18/21 1553  BP: (!) 183/99  Pulse: (!) 106  Resp: 18  Temp: 98 F (36.7 C)  SpO2: 100%     Temp:  [98 F (36.7 C)] 98 F (36.7 C) (11/29 1553) Pulse Rate:  [106] 106 (11/29 1553) Resp:  [18] 18 (11/29 1553) BP: (183)/(99) 183/99 (11/29 1553) SpO2:  [100 %] 100 % (11/29 1553)  General - Well nourished, well developed, in no apparent distress.  Ophthalmologic - fundi not visualized due to noncooperation.  Cardiovascular - Regular rhythm and rate.  Neuro - awake, alert, eyes open, orientated to age, place, time  and people. No aphasia except intermittent hesitation of words but mild presentation, following all simple commands. Able to name and repeat and read without difficulty. No gaze palsy, tracking bilaterally, visual field full, PERRL. No facial droop. Tongue midline. Bilateral UEs 5/5, no drift. Bilaterally LEs 5/5, no drift. Sensation symmetrical bilaterally, b/l FTN intact, gait not tested.  Marland Kitchen   NIH Stroke Scale  Level Of Consciousness 0=Alert; keenly responsive 1=Arouse to minor stimulation 2=Requires repeated stimulation to arouse or movements to pain 3=postures or unresponsive 0  LOC Questions to Month and Age 26=Answers both questions correctly 1=Answers one question correctly or dysarthria/intubated/trauma/language barrier 2=Answers neither question correctly or aphasia 0  LOC Commands      -Open/Close eyes     -Open/close grip     -Pantomime commands if communication barrier 0=Performs both tasks correctly 1=Performs one task correctly 2=Performs neighter task correctly 0  Best Gaze     -Only assess horizontal gaze 0=Normal 1=Partial gaze palsy 2=Forced deviation, or total gaze paresis 0  Visual 0=No visual loss 1=Partial hemianopia 2=Complete hemianopia 3=Bilateral hemianopia (blind including cortical blindness) 0  Facial Palsy     -Use grimace if obtunded 0=Normal symmetrical movement 1=Minor paralysis (asymmetry) 2=Partial paralysis (lower face) 3=Complete paralysis (upper and lower face) 0  Motor  0=No drift for 10/5 seconds 1=Drift, but does not hit bed 2=Some antigravity effort, hits  bed 3=No effort against gravity, limb falls 4=No movement 0=Amputation/joint fusion Right Arm 0     Leg 0    Left Arm 0  Leg 0  Limb Ataxia     - FNT/HTS 0=Absent or does not understand or paralyzed or amputation/joint fusion 1=Present in one limb 2=Present in two limbs 0  Sensory 0=Normal 1=Mild to moderate sensory loss 2=Severe to total sensory loss or coma/unresponsive 0   Best Language 0=No aphasia, normal 1=Mild to moderate aphasia 2=Severe aphasia 3=Mute, global aphasia, or coma/unresponsive 1  Dysarthria 0=Normal 1=Mild to moderate 2=Severe, unintelligible or mute/anarthric 0=intubated/unable to test 0  Extinction/Neglect 0=No abnormality 1=visual/tactile/auditory/spatia/personal inattention/Extinction to bilateral simultaneous stimulation 2=Profound neglect/extinction more than 1 modality  0  Total   1      Imaging Reviewed:  CT HEAD CODE STROKE WO CONTRAST  Result Date: 01/18/2021 CLINICAL DATA:  Code stroke.  Aphasia EXAM: CT HEAD WITHOUT CONTRAST TECHNIQUE: Contiguous axial images were obtained from the base of the skull through the vertex without intravenous contrast. COMPARISON:  None. FINDINGS: Brain: There is no acute intracranial hemorrhage, mass effect, or edema. Gray-white differentiation is preserved. Ventricles and sulci are normal in size and configuration. No extra-axial collection. Vascular: No hyperdense vessel. Skull: Unremarkable. Sinuses/Orbits: Mild patchy mucosal thickening. Right maxillary sinus retention cyst partially imaged. Other: Mastoid air cells are clear. ASPECTS ([place] Stroke Program Early CT Score) - Ganglionic level infarction (caudate, lentiform nuclei, internal capsule, insula, M1-M3 cortex): 7 - Supraganglionic infarction (M4-M6 cortex): 3 Total score (0-10 with 10 being normal): 10 IMPRESSION: There is no acute intracranial hemorrhage or evidence of acute infarction. ASPECT score is 10. These results were called by telephone at the time of interpretation on 01/18/2021 at 4:33 pm to provider [facility] , who verbally acknowledged these results. Electronically Signed   By: Macy Mis M.D.   On: 01/18/2021 16:35     Labs reviewed in epic and pertinent values follow: Cre 1.20  Assessment:  75 year old female with history of hypertension, diabetes, hyperlipidemia, stage Ib endometrial cancer status post  vaginal brachytherapy presented to ED for acute onset of speech difficulty around 3:15 PM. Symptoms lasted about 20 to 25 minutes and improved in ER. Patient complaining of slight headache but mild.  He denied any history of migraine.  BP with EMS 183/99, ER 180/79.  Glucose 79.  On my examination, patient may have mild intermittent hesitation of words and some word finding difficulty but very mild, able to name and repeat and describe pictures without difficulty.  NIH score 1 at the most.   Etiology for patient symptoms could be TIA, minor stroke, hypertensive encephalopathy, seizure or complicated migraine.  However, too mild and nondisabling to treat with TNK.  Not IR candidate given low NIH score.  Recommend further stroke work-up with MRI, CTA head and neck, 2D echo.  We will do EEG to rule out seizure.  Recommend aspirin 325 and Plavix 300 load, followed by aspirin 81 and Plavix 75 DAPT.  Continue home Lipitor.  Close neuro check.  Recommendations:  Recommend internal medicine admission/observation for further stroke/TIA work-up Frequent neuro checks Telemetry monitoring MRI brain  CTA head and neck Echocardiogram  UDS, fasting lipid panel and HgbA1C speech consult Permissive hypertension (only treat if BP > 220/120 unless a lower blood pressure is clinically necessary) for 24-48 hours post stroke onset GI and DVT prophylaxis  Recommend aspirin 325 and Plavix 300 load, followed by aspirin 81 and Plavix 75 DAPT.   Continue home Lipitor.   Discussed with Dr. Dina Rich ED physician We will follow    Consult Participants: Patient, husband, RN Location of the provider: [facility] Location of  the patient: [facility]  Time Code Stroke Page received:  4:28pm Time neurologist arrived:  4:31pm Time NIHSS completed: 4:45pm    This consult was provided via telemedicine with 2-way video and audio communication. The patient/family was informed that care would be provided in this way and agreed to receive care  in this manner.   This patient is receiving care for possible acute neurological changes. There was 60 minutes of care by this provider at the time of service, including time for direct evaluation via telemedicine, review of medical records, imaging studies and discussion of findings with providers, the patient and/or family.  Rosalin Hawking, MD PhD Stroke Neurology 01/18/2021 5:02 PM

## 2021-01-18 NOTE — ED Notes (Signed)
Pt in mri 

## 2021-01-18 NOTE — ED Provider Notes (Signed)
[facility] DEPT Provider Note   CSN: [id number] Arrival date & time: 01/18/21  1547  An emergency department physician performed an initial assessment on this suspected stroke patient at 109.  History Chief Complaint  Patient presents with   Aphasia    Caroline Blair is a 75 y.o. female.  HPI  75 year old female with past medical history of HTN, DM presents emergency department with acute change in speech.  About an hour prior to arrival the patient states that her speech abruptly changed.  She was having a hard time finding the words that she wanted to say and articulating them.  Husband reports that her speech sounded jumbled and stuttered, not making sense.  Since this started her speech is significantly improved.  She denies any associated facial droop, dizziness, vision change, unilateral weakness/numbness.  Has never had symptoms like this before.  She is otherwise been in her baseline health, compliant with medications.  No acute change to her health otherwise.  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     Family History  Problem Relation Age of Onset   Diabetes Father    Colon cancer Neg Hx     Social History   Tobacco Use   Smoking status: Never   Smokeless tobacco: Never  Substance Use Topics   Alcohol use: Yes     Comment: rare glass wine   Drug use: No    Home Medications Prior to Admission medications   Medication Sig Start Date End Date Taking? Authorizing Provider  Acetaminophen (TYLENOL EXTRA STRENGTH PO) Take 500 mg by mouth as needed (arthritis pain).   Yes [provider]  aspirin 500 MG tablet Take 500 mg by mouth every 6 (six) hours as needed for pain.   Yes [provider]  aspirin 81 MG tablet Take 81 mg by mouth daily.   Yes [provider]  atorvastatin (LIPITOR) 40 MG tablet Take 40 mg by mouth daily.   Yes [provider]  Cyanocobalamin (VITAMIN B 12 PO) Take 1 tablet by mouth daily.   Yes [provider]  glipiZIDE (GLUCOTROL XL) 5 MG 24 hr tablet Take 5 mg by mouth daily.   Yes [provider]  losartan (COZAAR) 100 MG tablet Take 100 mg by mouth daily. 09/19/18  Yes [provider]  metFORMIN (GLUCOPHAGE) 1000 MG tablet Take 1,000 mg by mouth 2 (two) times daily with a meal. 09/19/18  Yes [provider]  furosemide (LASIX) 20 MG tablet Take 10 mg by mouth daily. Patient not taking: Reported on 01/18/2021 11/23/20   [provider]  hydrochlorothiazide (HYDRODIURIL) 25 MG tablet Take 25 mg by mouth daily. Patient not taking: Reported on 01/18/2021 09/19/18   [provider]  hydrOXYzine (  ATARAX/VISTARIL) 25 MG tablet Take 1 Vistaril every 8-12 hours for stress and anxiety Patient not taking: Reported on 01/18/2021 07/27/20   Milton Ferguson, MD  losartan-hydrochlorothiazide (HYZAAR) 100-25 MG per tablet Take 1 tablet by mouth daily. Patient not taking: Reported on 01/18/2021    [provider]  sucralfate (CARAFATE) 1 g tablet Take 1 g by mouth 2 (two) times daily. Patient not taking: Reported on 01/18/2021 01/11/21   [provider]    Allergies    Patient has no known allergies.  Review of Systems   Review of Systems  Constitutional:  Negative for chills and fever.  HENT:   Negative for congestion.   Eyes:  Negative for visual disturbance.  Respiratory:  Negative for shortness of breath.   Cardiovascular:  Negative for chest pain.  Gastrointestinal:  Negative for abdominal pain, diarrhea and vomiting.  Genitourinary:  Negative for dysuria.  Musculoskeletal:  Negative for neck pain.  Skin:  Negative for rash.  Neurological:  Positive for speech difficulty. Negative for dizziness, tremors, syncope, facial asymmetry, weakness, numbness and headaches.   Physical Exam Updated Vital Signs BP (!) 156/79   Pulse 72   Temp 98 F (36.7 C) (Oral)   Resp 18   Ht 5\' 9"  (1.753 m)   SpO2 100%   BMI 35.88 kg/m   Physical Exam Vitals and nursing note reviewed.  Constitutional:      General: She is not in acute distress.    Appearance: Normal appearance.  HENT:     Head: Normocephalic.     Mouth/Throat:     Mouth: Mucous membranes are moist.  Cardiovascular:     Rate and Rhythm: Normal rate.  Pulmonary:     Effort: Pulmonary effort is normal. No respiratory distress.  Abdominal:     Palpations: Abdomen is soft.     Tenderness: There is no abdominal tenderness.  Skin:    General: Skin is warm.  Neurological:     Mental Status: She is alert and oriented to person, place, and time.     Cranial Nerves: No cranial nerve deficit.     Motor: No weakness.     Coordination: Coordination abnormal.     Gait: Gait normal.     Comments: At times stuttering speech with aphasia, no dysarthria.  Most prominent in triage, improving on exam  Psychiatric:        Mood and Affect: Mood normal.    ED Results / Procedures / Treatments   Labs (all labs ordered are listed, but only abnormal results are displayed) Labs Reviewed  CBC - Abnormal; Notable for the following components:      Result Value   Hemoglobin 11.3 (*)    HCT 35.3 (*)    All other components within normal limits  URINALYSIS, ROUTINE W REFLEX MICROSCOPIC - Abnormal; Notable for the following  components:   Color, Urine STRAW (*)    Ketones, ur 5 (*)    All other components within normal limits  I-STAT CHEM 8, ED - Abnormal; Notable for the following components:   BUN 29 (*)    Creatinine, Ser 1.20 (*)    Glucose, Bld 100 (*)    All other components within normal limits  RESP PANEL BY RT-PCR (FLU A&B, COVID) ARPGX2  ETHANOL  PROTIME-INR  DIFFERENTIAL  RAPID URINE DRUG SCREEN, HOSP PERFORMED  APTT  LIPID PANEL  HEMOGLOBIN A1C  COMPREHENSIVE METABOLIC PANEL  CBG MONITORING, ED    EKG EKG Interpretation  Date/Time:  Tuesday January 18 2021 17:43:52 EST Ventricular Rate:  75 PR Interval:  154 QRS Duration: 77 QT Interval:  395 QTC Calculation: 442 R Axis:   28 Text Interpretation: Sinus rhythm Ventricular premature complex Sinus rhythm with PAC and sinus arrythmia Confirmed by Lavenia Atlas [phone]) on 01/18/2021 6:50:58 PM  Radiology CT ANGIO HEAD NECK W WO CM  Result Date: 01/18/2021 CLINICAL DATA:  TIA.  Aphasia. EXAM: CT ANGIOGRAPHY HEAD AND NECK TECHNIQUE: Multidetector CT imaging of the head and neck was performed using the standard protocol during bolus administration of intravenous contrast. Multiplanar CT image reconstructions and MIPs were obtained to evaluate the vascular anatomy. Carotid stenosis measurements (when applicable) are obtained utilizing NASCET criteria, using the distal internal carotid diameter as the denominator. CONTRAST:  70mL OMNIPAQUE IOHEXOL 350 MG/ML SOLN COMPARISON:  None. FINDINGS: CTA NECK FINDINGS Aortic arch: Normal variant aortic arch branching pattern with common origin of the brachiocephalic and left common carotid arteries. Widely patent arch vessel origins. Right carotid system: Patent without evidence of stenosis, dissection, or significant atherosclerosis. Left carotid system: Patent without evidence of stenosis, dissection, or significant atherosclerosis. Vertebral arteries: The left vertebral artery is patent and strongly  dominant without evidence of stenosis, dissection, or significant atherosclerosis. The right vertebral artery is congenitally markedly hypoplastic and patent through the V1 and V2 segments with the V3 segment poorly visualized due to surrounding veins. Skeleton: Moderate cervical disc degeneration. Other neck: Bilateral thyroid nodules measuring up to 1.3 cm for which no imaging follow-up is recommended. No evidence of cervical lymphadenopathy. Upper chest: Clear lung apices. Review of the MIP images confirms the above findings CTA HEAD FINDINGS Anterior circulation: The internal carotid arteries are patent from skull base to carotid termini with minimal nonstenotic plaque bilaterally. ACAs and MCAs are patent without evidence of a proximal branch occlusion or significant proximal stenosis. The right A1 segment is hypoplastic. No aneurysm is identified. Posterior circulation: The intracranial left vertebral artery is widely patent and supplies the basilar. The intracranial right vertebral artery is extremely small and poorly visualized. The basilar artery is widely patent. Posterior communicating arteries are diminutive or absent. Both PCAs are patent without evidence of a significant proximal stenosis. No aneurysm is identified. Venous sinuses: Patent. Anatomic variants: Hypoplastic right A1 segment and right vertebral artery. Review of the MIP images confirms the above findings IMPRESSION: Largely unremarkable head and neck CTA. Minimal atherosclerosis without large vessel occlusion or significant proximal stenosis. Electronically Signed   By: Logan Bores M.D.   On: 01/18/2021 17:59   CT HEAD CODE STROKE WO CONTRAST  Result Date: 01/18/2021 CLINICAL DATA:  Code stroke.  Aphasia EXAM: CT HEAD WITHOUT CONTRAST TECHNIQUE: Contiguous axial images were obtained from the base of the skull through the vertex without intravenous contrast. COMPARISON:  None. FINDINGS: Brain: There is no acute intracranial hemorrhage,  mass effect, or edema. Gray-white differentiation is preserved. Ventricles and sulci are normal in size and configuration. No extra-axial collection. Vascular: No hyperdense vessel. Skull: Unremarkable. Sinuses/Orbits: Mild patchy mucosal thickening. Right maxillary sinus retention cyst partially imaged. Other: Mastoid air cells are clear. ASPECTS ([place] Stroke Program Early CT Score) - Ganglionic level infarction (caudate, lentiform nuclei, internal capsule, insula, M1-M3 cortex): 7 - Supraganglionic infarction (M4-M6 cortex): 3 Total score (0-10 with 10 being normal): 10 IMPRESSION: There is no acute intracranial hemorrhage or evidence of acute infarction. ASPECT score is 10. These results were called by telephone at the time of interpretation on 01/18/2021 at 4:33 pm to provider  Akeel Reffner , who verbally acknowledged these results. Electronically Signed   By: Macy Mis M.D.   On: 01/18/2021 16:35    Procedures .Critical Care Performed by: Lorelle Gibbs, DO Authorized by: Lorelle Gibbs, DO   Critical care provider statement:    Critical care time (minutes):  45   Critical care time was exclusive of:  Separately billable procedures and treating other patients   Critical care was necessary to treat or prevent imminent or life-threatening deterioration of the following conditions:  CNS failure or compromise   Critical care was time spent personally by me on the following activities:  Development of treatment plan with patient or surrogate, discussions with consultants, evaluation of patient's response to treatment, examination of patient, ordering and review of laboratory studies, ordering and review of radiographic studies, ordering and performing treatments and interventions, pulse oximetry, re-evaluation of patient's condition and review of old charts   Medications Ordered in ED Medications  clopidogrel (PLAVIX) tablet 300 mg (300 mg Oral Given 01/18/21 1718)    Followed by   clopidogrel (PLAVIX) tablet 75 mg (has no administration in time range)  aspirin EC tablet 325 mg (325 mg Oral Given 01/18/21 1718)    Followed by  aspirin EC tablet 81 mg (has no administration in time range)  atorvastatin (LIPITOR) tablet 40 mg (40 mg Oral Given 01/18/21 1718)  0.9 %  sodium chloride infusion ( Intravenous New Bag/Given 01/18/21 1734)   stroke: mapping our early stages of recovery book (has no administration in time range)  iohexol (OMNIPAQUE) 350 MG/ML injection 75 mL (75 mLs Intravenous Contrast Given 01/18/21 1722)    ED Course  I have reviewed the triage vital signs and the nursing notes.  Pertinent labs & imaging results that were available during my care of the patient were reviewed by me and considered in my medical decision making (see chart for details).    MDM Rules/Calculators/A&P                           75 year old female presents emergency department change in speech.  Evaluated as a code stroke.  Acute speech change about an hour prior to arrival described as stuttering, aphasic.  Initially more prominent during triage exam.  Improving on evaluation in room.  Head CT shows no bleed.  Patient is never had symptoms like this before.  Denies headache.  Otherwise appears neuro intact.  Teleneurology evaluated the patient, NIH is 0 with continued improving speech.  No tPA indicated.  However given sudden onset concerning symptoms plan for TIA admission and work-up.  Neurology will follow.  Patients evaluation and results requires admission for further treatment and care. Patient agrees with admission plan, offers no new complaints and is stable/unchanged at time of admit.  Final Clinical Impression(s) / ED Diagnoses Final diagnoses:  None    Rx / DC Orders ED Discharge Orders     None        Lorelle Gibbs, DO 01/18/21 1910

## 2021-01-19 ENCOUNTER — Other Ambulatory Visit (HOSPITAL_COMMUNITY): Payer: Medicare Other

## 2021-01-19 ENCOUNTER — Observation Stay (HOSPITAL_COMMUNITY)
Admit: 2021-01-19 | Discharge: 2021-01-19 | Disposition: A | Discharge status: Home / Self Care | Payer: Medicare Other | Attending: Neurology | Admitting: Neurology

## 2021-01-19 DIAGNOSIS — R479 Unspecified speech disturbances: Secondary | ICD-10-CM

## 2021-01-19 DIAGNOSIS — G459 Transient cerebral ischemic attack, unspecified: Secondary | ICD-10-CM | POA: Diagnosis not present

## 2021-01-19 DIAGNOSIS — R600 Localized edema: Secondary | ICD-10-CM | POA: Diagnosis present

## 2021-01-19 DIAGNOSIS — E785 Hyperlipidemia, unspecified: Secondary | ICD-10-CM | POA: Diagnosis present

## 2021-01-19 LAB — LIPID PANEL
Cholesterol: 120 mg/dL (ref 0–200)
HDL: 74 mg/dL (ref >40)
LDL Cholesterol: 41 mg/dL (ref 0–99)
Total CHOL/HDL Ratio: 1.6 RATIO
Triglycerides: 26 mg/dL (ref <150)
VLDL: 5 mg/dL (ref 0–40)

## 2021-01-19 LAB — HEPATIC FUNCTION PANEL
ALT: 12 U/L (ref 0–44)
AST: 17 U/L (ref 15–41)
Albumin: 3.9 g/dL (ref 3.5–5.0)
Alkaline Phosphatase: 66 U/L (ref 38–126)
Bilirubin, Direct: 0.1 mg/dL (ref 0.0–0.2)
Indirect Bilirubin: 0.6 mg/dL (ref 0.3–0.9)
Total Bilirubin: 0.7 mg/dL (ref 0.3–1.2)
Total Protein: 7.1 g/dL (ref 6.5–8.1)

## 2021-01-19 LAB — MAGNESIUM
Magnesium: 2 mg/dL (ref 1.7–2.4)
Magnesium: 1.9 mg/dL (ref 1.7–2.4)

## 2021-01-19 LAB — COMPREHENSIVE METABOLIC PANEL
ALT: 11 U/L (ref 0–44)
AST: 14 U/L — ABNORMAL LOW (ref 15–41)
Albumin: 3.6 g/dL (ref 3.5–5.0)
Alkaline Phosphatase: 61 U/L (ref 38–126)
Anion gap: 7 (ref 5–15)
BUN: 21 mg/dL (ref 8–23)
CO2: 25 mmol/L (ref 22–32)
Calcium: 8.8 mg/dL — ABNORMAL LOW (ref 8.9–10.3)
Chloride: 104 mmol/L (ref 98–111)
Creatinine, Ser: 1.16 mg/dL — ABNORMAL HIGH (ref 0.44–1.00)
GFR, Estimated: 49 mL/min — ABNORMAL LOW (ref >60)
Glucose, Bld: 83 mg/dL (ref 70–99)
Potassium: 3.6 mmol/L (ref 3.5–5.1)
Sodium: 136 mmol/L (ref 135–145)
Total Bilirubin: 0.7 mg/dL (ref 0.3–1.2)
Total Protein: 6.4 g/dL — ABNORMAL LOW (ref 6.5–8.1)

## 2021-01-19 LAB — CBC WITH DIFFERENTIAL/PLATELET
Abs Immature Granulocytes: 0.01 10*3/uL (ref 0.00–0.07)
Basophils Absolute: 0 10*3/uL (ref 0.0–0.1)
Basophils Relative: 1 %
Eosinophils Absolute: 0.1 10*3/uL (ref 0.0–0.5)
Eosinophils Relative: 2 %
HCT: 28.5 % — ABNORMAL LOW (ref 36.0–46.0)
Hemoglobin: 9.1 g/dL — ABNORMAL LOW (ref 12.0–15.0)
Immature Granulocytes: 0 %
Lymphocytes Relative: 27 %
Lymphs Abs: 1.1 10*3/uL (ref 0.7–4.0)
MCH: 28 pg (ref 26.0–34.0)
MCHC: 31.9 g/dL (ref 30.0–36.0)
MCV: 87.7 fL (ref 80.0–100.0)
Monocytes Absolute: 0.6 10*3/uL (ref 0.1–1.0)
Monocytes Relative: 15 %
Neutro Abs: 2.3 10*3/uL (ref 1.7–7.7)
Neutrophils Relative %: 55 %
Platelets: 257 10*3/uL (ref 150–400)
RBC: 3.25 MIL/uL — ABNORMAL LOW (ref 3.87–5.11)
RDW: 13 % (ref 11.5–15.5)
WBC: 4.1 10*3/uL (ref 4.0–10.5)
nRBC: 0 % (ref 0.0–0.2)

## 2021-01-19 LAB — CREATININE, URINE, RANDOM: Creatinine, Urine: 70.83 mg/dL

## 2021-01-19 LAB — PHOSPHORUS
Phosphorus: 4.7 mg/dL — ABNORMAL HIGH (ref 2.5–4.6)
Phosphorus: 4.9 mg/dL — ABNORMAL HIGH (ref 2.5–4.6)

## 2021-01-19 LAB — SODIUM, URINE, RANDOM: Sodium, Ur: 154 mmol/L

## 2021-01-19 LAB — TSH: TSH: 2.424 u[IU]/mL (ref 0.350–4.500)

## 2021-01-19 LAB — GLUCOSE, CAPILLARY
Glucose-Capillary: 107 mg/dL — ABNORMAL HIGH (ref 70–99)
Glucose-Capillary: 120 mg/dL — ABNORMAL HIGH (ref 70–99)
Glucose-Capillary: 156 mg/dL — ABNORMAL HIGH (ref 70–99)
Glucose-Capillary: 85 mg/dL (ref 70–99)
Glucose-Capillary: 92 mg/dL (ref 70–99)

## 2021-01-19 LAB — CK: Total CK: 67 U/L (ref 38–234)

## 2021-01-19 LAB — OSMOLALITY, URINE: Osmolality, Ur: 527 mOsm/kg (ref 300–900)

## 2021-01-19 MED ORDER — INSULIN ASPART 100 UNIT/ML IJ SOLN: 0.0000 [IU] | Freq: Every day | INTRAMUSCULAR | Status: DC

## 2021-01-19 MED ORDER — INSULIN ASPART 100 UNIT/ML IJ SOLN: 0.0000 [IU] | Freq: Three times a day (TID) | INTRAMUSCULAR | Status: DC

## 2021-01-19 MED ORDER — ASPIRIN 81 MG PO TABS
81.0000 mg | ORAL_TABLET | Freq: Every day | ORAL | 0 refills | Status: AC
Start: 2021-01-19 — End: 2021-02-09

## 2021-01-19 MED ORDER — CLOPIDOGREL BISULFATE 75 MG PO TABS
75.0000 mg | ORAL_TABLET | Freq: Every day | ORAL | 0 refills | Status: AC
Start: 2021-01-20 — End: ?

## 2021-01-19 NOTE — Plan of Care (Signed)
  Problem: Education: Goal: Knowledge of General Education information will improve Description: Including pain rating scale, medication(s)/side effects and non-pharmacologic comfort measures Outcome: Adequate for Discharge   Problem: Activity: Goal: Risk for activity intolerance will decrease Outcome: Adequate for Discharge   Problem: Nutrition: Goal: Adequate nutrition will be maintained Outcome: Adequate for Discharge   Problem: Coping: Goal: Level of anxiety will decrease Outcome: Adequate for Discharge   Problem: Elimination: Goal: Will not experience complications related to bowel motility Outcome: Adequate for Discharge Goal: Will not experience complications related to urinary retention Outcome: Adequate for Discharge   Problem: Pain Managment: Goal: General experience of comfort will improve Outcome: Adequate for Discharge   Problem: Safety: Goal: Ability to remain free from injury will improve Outcome: Adequate for Discharge   Problem: Skin Integrity: Goal: Risk for impaired skin integrity will decrease Outcome: Adequate for Discharge

## 2021-01-19 NOTE — Plan of Care (Addendum)
Plan of care:  Patient with a history of HTN, HLD, endometrial CA presented to ED with episode of aphasia lasting 20-25 minutes. And now neuro intact. MRI negative for acute stroke.  LDL 41 and A1C pending. Patient to complete stroke/TIA workup with TTE and can be discharged home from neuro standpoint with DAPT (aspirin 81 mg daily/Plavix 75 mg daily) for three weeks followed by plavix alone. Continue statin. She will follow up at [facility] clinic in 4 weeks after discharge.  ADDENDUM: TTE was delayed and not able to be completed today. We are OK with outpt echo to be arranged. Thanks.   Rosalin Hawking, MD PhD Stroke Neurology 01/19/2021 4:56 PM

## 2021-01-19 NOTE — Assessment & Plan Note (Addendum)
Presents with complaints of speech therapy.   Described as incoherent speech.   Currently resolved. Neurology consulted. CT head unremarkable.  MRI brain negative for any acute stroke. CTA status negative as of significant large vessel occlusion. PT recommends no therapy.  Hemoglobin A1c currently pending. Lipid profile LDL in 50s. On statin. Currently on aspirin. Continue aspirin and Plavix dual antibiotic therapy for 3 weeks followed by plavix only. Marland Kitchen

## 2021-01-19 NOTE — Assessment & Plan Note (Addendum)
Hemoglobin A1c currently pending.  We will continue home regimen on discharge. Patient will benefit from addition of GLP-1 agonist or SGLT2 inhibitors given her vascular condition.

## 2021-01-19 NOTE — Procedures (Signed)
Patient Name: Caroline Blair  MRN: [id number]  Epilepsy Attending: Lora Havens  Referring Physician/Provider: Dr Rosalin Hawking Date: 01/19/2021 Duration: 23.10 mins  Patient history: 75yo F with acute onset of speech difficulty. EEG to evaluate for seizure.   Level of alertness: Awake, asleep  AEDs during EEG study: None  Technical aspects: This EEG study was done with scalp electrodes positioned according to the 10-20 International system of electrode placement. Electrical activity was acquired at a sampling rate of 500Hz  and reviewed with a high frequency filter of 70Hz  and a low frequency filter of 1Hz . EEG data were recorded continuously and digitally stored.   Description: The posterior dominant rhythm consists of 10 Hz activity of moderate voltage (25-35 uV) seen predominantly in posterior head regions, symmetric and reactive to eye opening and eye closing. Sleep was characterized by vertex waves maximal frontocentral region.  EEG showed intermittent 2-3hz  delta slowing in right temporal region. Hyperventilation and photic stimulation were not performed.     ABNORMALITY - Intermittent slow, right temporal region  IMPRESSION: This study is suggestive of cortical dysfunction arising from right temporal region, nonspecific etiology. No seizures or epileptiform discharges were seen throughout the recording.  [name]

## 2021-01-19 NOTE — Evaluation (Signed)
Physical Therapy Evaluation-1x Patient Details Name: Caroline Blair MRN: [id number] DOB: February 05, 1946 Today's Date: 01/19/2021  History of Present Illness  75 year old female admitted 01/18/2021 due to acute onset of speech difficulty. Dx: TIA.  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 No evidence of recent hemorrhage. 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 She walked ~300 feet around the unit. No overt LOB. No reports of dizziness. She does report headache-rated 2/10-not worse with activity. No PT needs. 1x eval. Will sign off.        Recommendations for follow up therapy are one component of a multi-disciplinary discharge planning process, led by the attending physician.  Recommendations may be updated based on patient status, additional functional criteria and insurance authorization.  Follow Up Recommendations No PT follow up    Assistance Recommended at Discharge None  Functional Status Assessment Patient has not had a recent decline in their functional status  Equipment Recommendations  None recommended by PT    Recommendations for Other Services       Precautions / Restrictions Precautions Precautions: None Restrictions Weight Bearing Restrictions: No      Mobility  Bed Mobility Overal bed mobility: Independent                  Transfers Overall transfer level: Independent                      Ambulation/Gait Ambulation/Gait assistance: Modified independent (Device/Increase time) Gait Distance (Feet): 300 Feet Assistive device: None Gait Pattern/deviations: Step-through pattern       General Gait Details: Pt reports hx of arthritis-gait and stability are "better with shoes on". No overt LOB. No dizziness. Tolerated  distance well.  Stairs            Wheelchair Mobility    Modified Rankin (Stroke Patients Only)       Balance Overall balance assessment: Mild deficits observed, not formally tested                                           Pertinent Vitals/Pain Pain Assessment: 0-10 Pain Score: 2  Pain Location: headache Pain Intervention(s): Monitored during session    [place] expects to be discharged to:: Private residence Living Arrangements: Alone Available Help at Discharge: Family Type of Home: House Home Access: Stairs to enter   Technical brewer of Steps: 1 Alternate Level Stairs-Number of Steps: 1 flight Home Layout: Two level Home Equipment: Cane - single point      Prior Function Prior Level of Function : Independent/Modified Independent                     Hand Dominance        Extremity/Trunk Assessment   Upper Extremity Assessment Upper Extremity Assessment: Overall WFL for tasks assessed    Lower Extremity Assessment Lower Extremity Assessment: Overall WFL for tasks assessed    Cervical / Trunk Assessment Cervical / Trunk Assessment: Normal  Communication   Communication: No difficulties  Cognition Arousal/Alertness: Awake/alert Behavior During Therapy: WFL for tasks assessed/performed Overall Cognitive Status: Within Functional Limits for tasks assessed  General Comments      Exercises     Assessment/Plan    PT Assessment Patient does not need any further PT services  PT Problem List         PT Treatment Interventions      PT Goals (Current goals can be found in the Care Plan section)  Acute Rehab PT Goals Patient Stated Goal: home soon. PT Goal Formulation: All assessment and education complete, DC therapy    Frequency     Barriers to discharge        Co-evaluation               AM-PAC PT "6 Clicks" Mobility   Outcome Measure Help needed turning from your back to your side while in a flat bed without using bedrails?: None Help needed moving from lying on your back to sitting on the side of a flat bed without using bedrails?: None Help needed moving to and from a bed to a chair (including a wheelchair)?: None Help needed standing up from a chair using your arms (e.g., wheelchair or bedside chair)?: None Help needed to walk in hospital room?: None Help needed climbing 3-5 steps with a railing? : None 6 Click Score: 24    End of Session   Activity Tolerance: Patient tolerated treatment well Patient left: in bed;with call bell/phone within reach;with family/visitor present        Time: 1050-1059 PT Time Calculation (min) (ACUTE ONLY): 9 min   Charges:   PT Evaluation $PT Eval Low Complexity: [place], PT Acute Rehabilitation  Office: [phone] Pager: [phone]

## 2021-01-19 NOTE — Assessment & Plan Note (Addendum)
Echocardiogram currently pending. No significant murmur on my exam. For now recommendation is perform outpt Echocardiogram.

## 2021-01-19 NOTE — Assessment & Plan Note (Signed)
AKI ruled out.  Baseline serum creatinine around 1.  On presentation serum creatinine around 1.2. Patient has CKD stage IIIa

## 2021-01-19 NOTE — Progress Notes (Signed)
EEG complete - results pending 

## 2021-01-19 NOTE — Assessment & Plan Note (Addendum)
Appears to be chronic. Continue Lasix.

## 2021-01-19 NOTE — Progress Notes (Signed)
OT Cancellation Note  Patient Details Name: Caroline Blair MRN: [id number] DOB: [birth year]   Cancelled Treatment:    Reason Eval/Treat Not Completed: OT screened, walking independently in hallway with PT. Went in and spoke with Pt and husband who express no concerns or questions. Cognition intact as well as fine motor/BUE strength. no needs identified, will sign off  Jaci Carrel 01/19/2021, 11:05 AM  Jesse Sans OTR/L Acute Rehabilitation Services Pager: [phone] Office: [phone]

## 2021-01-19 NOTE — Assessment & Plan Note (Signed)
Continue statin.  LDL adequate.

## 2021-01-19 NOTE — Assessment & Plan Note (Addendum)
Blood pressure currently well controlled.  Patient is on losartan as well as Lasix.  We will continue.

## 2021-01-20 ENCOUNTER — Other Ambulatory Visit (HOSPITAL_COMMUNITY): Payer: Medicare Other

## 2021-01-20 LAB — HEMOGLOBIN A1C
Hgb A1c MFr Bld: 5.6 % (ref 4.8–5.6)
Mean Plasma Glucose: 114 mg/dL

## 2021-01-20 NOTE — Discharge Summary (Signed)
Physician Discharge Summary   Patient name: Caroline Blair  Admit date:     01/18/2021  Discharge date: 01/19/2021   Discharge Physician: Berle Mull   PCP: [facility]   Recommendations at discharge: follow up with PCP in 1 week Need Echocardiogram outpatient Follow up with neurology as recommended  Discharge Diagnoses Principal Problem:   TIA (transient ischemic attack) Active Problems:   Type 2 diabetes mellitus with chronic kidney disease, without long-term current use of insulin (HCC)   Hyperlipidemia   Hypertension   Pedal edema   Heart murmur   Chronic kidney disease, stage 3a (Caroline Blair)   Resolved Diagnoses Resolved Problems:   * No resolved hospital problems. [facility] Course   No notes on file   * TIA (transient ischemic attack) Presents with complaints of speech therapy.   Described as incoherent speech.   Currently resolved. Neurology consulted. CT head unremarkable.  MRI brain negative for any acute stroke. CTA status negative as of significant large vessel occlusion. PT recommends no therapy.  Hemoglobin A1c currently pending. Lipid profile LDL in 50s. On statin. Currently on aspirin. Continue aspirin and Plavix dual antibiotic therapy for 3 weeks followed by plavix only. .   Type 2 diabetes mellitus with chronic kidney disease, without long-term current use of insulin (HCC) Hemoglobin A1c currently pending.  We will continue home regimen on discharge. Patient will benefit from addition of GLP-1 agonist or SGLT2 inhibitors given her vascular condition.  Hyperlipidemia Continue statin.  LDL adequate.  Pedal edema Appears to be chronic. Continue Lasix.  Hypertension Blood pressure currently well controlled.  Patient is on losartan as well as Lasix.  We will continue.  Chronic kidney disease, stage 3a (Caroline Blair) AKI ruled out.  Baseline serum creatinine around 1.  On presentation serum creatinine around 1.2. Patient has  CKD stage IIIa  Heart murmur Echocardiogram currently pending. No significant murmur on my exam. For now recommendation is perform outpt Echocardiogram.   Procedures performed: EEG   Condition at discharge: good  Exam General: Appear in mild distress, no Rash; Oral Mucosa Clear, moist. no Abnormal Neck Mass Or lumps, Conjunctiva normal  Cardiovascular: S1 and S2 Present, no Murmur, Respiratory: good respiratory effort, Bilateral Air entry present and CTA, no Crackles, no wheezes Abdomen: Bowel Sound present, Soft and no tenderness Extremities: no Pedal edema Neurology: alert and oriented to time, place, and person affect appropriate. no new focal deficit Gait not checked due to patient safety concerns    Disposition: Home  Discharge time: greater than 30 minutes.  Follow-up Information     [facility]. Schedule an appointment as soon as possible for a visit in 1 month(s).   Specialty: Neurology Why: stroke clinic Contact information: [place], [place]. Schedule an appointment as soon as possible for a visit in 1 week(s).   Specialty: Family Medicine Contact information: [place] [address] [phone]         [place] [facility] Follow up.   Specialty: Cardiology Why: office should call you to schedule the echocardiogram. Contact information: [address], [place] [phone]                Allergies as of 01/19/2021   No Known Allergies      Medication List  STOP taking these medications    hydrOXYzine 25 MG tablet Commonly known as: ATARAX   losartan-hydrochlorothiazide 100-25 MG tablet Commonly known as: HYZAAR   sucralfate 1 g tablet Commonly known as: CARAFATE       TAKE these medications    aspirin 81 MG tablet Take 1  tablet (81 mg total) by mouth daily for 21 days.   atorvastatin 40 MG tablet Commonly known as: LIPITOR Take 40 mg by mouth daily.   clopidogrel 75 MG tablet Commonly known as: PLAVIX Take 1 tablet (75 mg total) by mouth daily.   furosemide 20 MG tablet Commonly known as: LASIX Take 10 mg by mouth daily.   glipiZIDE 5 MG 24 hr tablet Commonly known as: GLUCOTROL XL Take 5 mg by mouth daily.   losartan 100 MG tablet Commonly known as: COZAAR Take 100 mg by mouth daily.   metFORMIN 1000 MG tablet Commonly known as: GLUCOPHAGE Take 1,000 mg by mouth 2 (two) times daily with a meal.   TYLENOL EXTRA STRENGTH PO Take 500 mg by mouth as needed (arthritis pain).   VITAMIN B 12 PO Take 1 tablet by mouth daily.        CT ANGIO HEAD NECK W WO CM  Result Date: 01/18/2021 CLINICAL DATA:  TIA.  Aphasia. EXAM: CT ANGIOGRAPHY HEAD AND NECK TECHNIQUE: Multidetector CT imaging of the head and neck was performed using the standard protocol during bolus administration of intravenous contrast. Multiplanar CT image reconstructions and MIPs were obtained to evaluate the vascular anatomy. Carotid stenosis measurements (when applicable) are obtained utilizing NASCET criteria, using the distal internal carotid diameter as the denominator. CONTRAST:  84mL OMNIPAQUE IOHEXOL 350 MG/ML SOLN COMPARISON:  None. FINDINGS: CTA NECK FINDINGS Aortic arch: Normal variant aortic arch branching pattern with common origin of the brachiocephalic and left common carotid arteries. Widely patent arch vessel origins. Right carotid system: Patent without evidence of stenosis, dissection, or significant atherosclerosis. Left carotid system: Patent without evidence of stenosis, dissection, or significant atherosclerosis. Vertebral arteries: The left vertebral artery is patent and strongly dominant without evidence of stenosis, dissection, or significant atherosclerosis. The right vertebral artery is congenitally markedly  hypoplastic and patent through the V1 and V2 segments with the V3 segment poorly visualized due to surrounding veins. Skeleton: Moderate cervical disc degeneration. Other neck: Bilateral thyroid nodules measuring up to 1.3 cm for which no imaging follow-up is recommended. No evidence of cervical lymphadenopathy. Upper chest: Clear lung apices. Review of the MIP images confirms the above findings CTA HEAD FINDINGS Anterior circulation: The internal carotid arteries are patent from skull base to carotid termini with minimal nonstenotic plaque bilaterally. ACAs and MCAs are patent without evidence of a proximal branch occlusion or significant proximal stenosis. The right A1 segment is hypoplastic. No aneurysm is identified. Posterior circulation: The intracranial left vertebral artery is widely patent and supplies the basilar. The intracranial right vertebral artery is extremely small and poorly visualized. The basilar artery is widely patent. Posterior communicating arteries are diminutive or absent. Both PCAs are patent without evidence of a significant proximal stenosis. No aneurysm is identified. Venous sinuses: Patent. Anatomic variants: Hypoplastic right A1 segment and right vertebral artery. Review of the MIP images confirms the above findings IMPRESSION: Largely unremarkable head and neck CTA. Minimal atherosclerosis without large vessel occlusion or significant proximal stenosis. Electronically Signed   By: Logan Bores M.D.   On: 01/18/2021 17:59   MR BRAIN WO CONTRAST  Result Date: 01/18/2021 CLINICAL DATA:  Transient  ischemic attack (TIA). Speech disturbance. EXAM: MRI HEAD WITHOUT CONTRAST TECHNIQUE: Multiplanar, multiecho pulse sequences of the brain and surrounding structures were obtained without intravenous contrast. COMPARISON:  Head CT and CTA 01/18/2021 FINDINGS: Brain: There is no evidence of an acute infarct, mass, midline shift, or extra-axial fluid collection. T2 hyperintensities in the  cerebral white matter and pons are nonspecific but compatible with mild chronic small vessel ischemic disease. The ventricles and sulci are within normal limits for age. There is a subcentimeter focus of T1 and T2 hypointensity with prominent susceptibility artifact in the anterior left temporal lobe compatible with chronic blood products, possibly in a cavernoma. There is no associated edema or evidence of recent hemorrhage. Vascular: Poorly visualized distal right vertebral artery, shown to be hypoplastic on today's CTA. Other major intracranial vascular flow voids are preserved. Skull and upper cervical spine: Unremarkable bone marrow signal para Sinuses/Orbits: Bilateral cataract extraction. Mild right ethmoid air cell mucosal thickening. Mucous retention cyst in the right maxillary sinus. Clear mastoid air cells. Other: None. IMPRESSION: 1. No acute intracranial abnormality. 2. Mild chronic small vessel ischemic disease. 3. 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 No evidence of recent hemorrhage. Electronically Signed   By: Logan Bores M.D.   On: 01/18/2021 18:52   DG CHEST PORT 1 VIEW  Result Date: 01/18/2021 CLINICAL DATA:  TIA EXAM: PORTABLE CHEST 1 VIEW COMPARISON:  Chest x-ray 07/27/2020 FINDINGS: The heart size and mediastinal contours are within normal limits. Both lungs are clear. The visualized skeletal structures are unremarkable. IMPRESSION: No active disease. Electronically Signed   By: Ronney Asters M.D.   On: 01/18/2021 20:08   EEG adult  Result Date: 01/19/2021 Lora Havens, MD     01/19/2021  5:40 PM Patient Name: JOAN HERSCHBERGER MRN: [id number] Epilepsy Attending: Lora Havens Referring Physician/Provider: Dr Rosalin Hawking Date: 01/19/2021 Duration: 23.10 mins Patient history: 75yo F with acute onset of speech difficulty. EEG to evaluate for seizure. Level of alertness: Awake, asleep AEDs during EEG study: None Technical aspects:  This EEG study was done with scalp electrodes positioned according to the 10-20 International system of electrode placement. Electrical activity was acquired at a sampling rate of 500Hz  and reviewed with a high frequency filter of 70Hz  and a low frequency filter of 1Hz . EEG data were recorded continuously and digitally stored. Description: The posterior dominant rhythm consists of 10 Hz activity of moderate voltage (25-35 uV) seen predominantly in posterior head regions, symmetric and reactive to eye opening and eye closing. Sleep was characterized by vertex waves maximal frontocentral region.  EEG showed intermittent 2-3hz  delta slowing in right temporal region. Hyperventilation and photic stimulation were not performed.   ABNORMALITY - Intermittent slow, right temporal region IMPRESSION: This study is suggestive of cortical dysfunction arising from right temporal region, nonspecific etiology. No seizures or epileptiform discharges were seen throughout the recording. Lora Havens   CT HEAD CODE STROKE WO CONTRAST  Result Date: 01/18/2021 CLINICAL DATA:  Code stroke.  Aphasia EXAM: CT HEAD WITHOUT CONTRAST TECHNIQUE: Contiguous axial images were obtained from the base of the skull through the vertex without intravenous contrast. COMPARISON:  None. FINDINGS: Brain: There is no acute intracranial hemorrhage, mass effect, or edema. Gray-white differentiation is preserved. Ventricles and sulci are normal in size and configuration. No extra-axial collection. Vascular: No hyperdense vessel. Skull: Unremarkable. Sinuses/Orbits: Mild patchy mucosal thickening. Right maxillary sinus retention cyst partially imaged. Other: Mastoid air cells are  clear. ASPECTS ([place] Stroke Program Early CT Score) - Ganglionic level infarction (caudate, lentiform nuclei, internal capsule, insula, M1-M3 cortex): 7 - Supraganglionic infarction (M4-M6 cortex): 3 Total score (0-10 with 10 being normal): 10 IMPRESSION: There is no acute  intracranial hemorrhage or evidence of acute infarction. ASPECT score is 10. These results were called by telephone at the time of interpretation on 01/18/2021 at 4:33 pm to provider [facility] , who verbally acknowledged these results. Electronically Signed   By: Macy Mis M.D.   On: 01/18/2021 16:35   Results for orders placed or performed during the hospital encounter of 01/18/21  Resp Panel by RT-PCR (Flu A&B, Covid) Nasopharyngeal Swab     Status: None   Collection Time: 01/18/21  4:52 PM   Specimen: Nasopharyngeal Swab; Nasopharyngeal(NP) swabs in vial transport medium  Result Value Ref Range Status   SARS Coronavirus 2 by RT PCR NEGATIVE NEGATIVE Final    Comment: (NOTE) SARS-CoV-2 target nucleic acids are NOT DETECTED.  The SARS-CoV-2 RNA is generally detectable in upper respiratory specimens during the acute phase of infection. The lowest concentration of SARS-CoV-2 viral copies this assay can detect is 138 copies/mL. A negative result does not preclude SARS-Cov-2 infection and should not be used as the sole basis for treatment or other patient management decisions. A negative result may occur with  improper specimen collection/handling, submission of specimen other than nasopharyngeal swab, presence of viral mutation(s) within the areas targeted by this assay, and inadequate number of viral copies(<138 copies/mL). A negative result must be combined with clinical observations, patient history, and epidemiological information. The expected result is Negative.  Fact Sheet for Patients:  EntrepreneurPulse.com.au  Fact Sheet for Healthcare Providers:  IncredibleEmployment.be  This test is no t yet approved or cleared by the [place] FDA and  has been authorized for detection and/or diagnosis of SARS-CoV-2 by FDA under an Emergency Use Authorization (EUA). This EUA will remain  in effect (meaning this test can be used) for the  duration of the COVID-19 declaration under Section 564(b)(1) of the Act, 21 U.S.C.section 360bbb-3(b)(1), unless the authorization is terminated  or revoked sooner.       Influenza A by PCR NEGATIVE NEGATIVE Final   Influenza B by PCR NEGATIVE NEGATIVE Final    Comment: (NOTE) The Xpert Xpress SARS-CoV-2/FLU/RSV plus assay is intended as an aid in the diagnosis of influenza from Nasopharyngeal swab specimens and should not be used as a sole basis for treatment. Nasal washings and aspirates are unacceptable for Xpert Xpress SARS-CoV-2/FLU/RSV testing.  Fact Sheet for Patients: EntrepreneurPulse.com.au  Fact Sheet for Healthcare Providers: IncredibleEmployment.be  This test is not yet approved or cleared by the [place] FDA and has been authorized for detection and/or diagnosis of SARS-CoV-2 by FDA under an Emergency Use Authorization (EUA). This EUA will remain in effect (meaning this test can be used) for the duration of the COVID-19 declaration under Section 564(b)(1) of the Act, 21 U.S.C. section 360bbb-3(b)(1), unless the authorization is terminated or revoked.  Performed at [facility], [place] [address], [place], [place] [address]     Signed:  Berle Mull MD.  [facility] 01/19/2021 , 7:15 AM

## 2021-01-25 DIAGNOSIS — Z961 Presence of intraocular lens: Secondary | ICD-10-CM | POA: Diagnosis not present

## 2021-01-25 DIAGNOSIS — I10 Essential (primary) hypertension: Secondary | ICD-10-CM | POA: Diagnosis not present

## 2021-01-25 DIAGNOSIS — H35033 Hypertensive retinopathy, bilateral: Secondary | ICD-10-CM | POA: Diagnosis not present

## 2021-01-25 DIAGNOSIS — H35371 Puckering of macula, right eye: Secondary | ICD-10-CM | POA: Diagnosis not present

## 2021-01-25 DIAGNOSIS — E113593 Type 2 diabetes mellitus with proliferative diabetic retinopathy without macular edema, bilateral: Secondary | ICD-10-CM | POA: Diagnosis not present

## 2021-01-25 DIAGNOSIS — Z7984 Long term (current) use of oral hypoglycemic drugs: Secondary | ICD-10-CM | POA: Diagnosis not present

## 2021-01-26 DIAGNOSIS — J029 Acute pharyngitis, unspecified: Secondary | ICD-10-CM | POA: Diagnosis not present

## 2021-01-26 DIAGNOSIS — Z09 Encounter for follow-up examination after completed treatment for conditions other than malignant neoplasm: Secondary | ICD-10-CM | POA: Diagnosis not present

## 2021-01-26 DIAGNOSIS — Z7984 Long term (current) use of oral hypoglycemic drugs: Secondary | ICD-10-CM | POA: Diagnosis not present

## 2021-01-26 DIAGNOSIS — I10 Essential (primary) hypertension: Secondary | ICD-10-CM | POA: Diagnosis not present

## 2021-01-26 DIAGNOSIS — E1165 Type 2 diabetes mellitus with hyperglycemia: Secondary | ICD-10-CM | POA: Diagnosis not present

## 2021-01-26 DIAGNOSIS — G459 Transient cerebral ischemic attack, unspecified: Secondary | ICD-10-CM | POA: Diagnosis not present

## 2021-01-26 DIAGNOSIS — N183 Chronic kidney disease, stage 3 unspecified: Secondary | ICD-10-CM | POA: Diagnosis not present

## 2021-02-03 ENCOUNTER — Ambulatory Visit (INDEPENDENT_AMBULATORY_CARE_PROVIDER_SITE_OTHER): Payer: Medicare Other

## 2021-02-03 ENCOUNTER — Other Ambulatory Visit: Payer: Self-pay

## 2021-02-03 DIAGNOSIS — G459 Transient cerebral ischemic attack, unspecified: Secondary | ICD-10-CM

## 2021-02-03 DIAGNOSIS — R4789 Other speech disturbances: Secondary | ICD-10-CM | POA: Diagnosis not present

## 2021-02-03 LAB — ECHOCARDIOGRAM COMPLETE
AR max vel: 2.62 cm2
AV Area VTI: 3.12 cm2
AV Area mean vel: 2.79 cm2
AV Mean grad: 4 mmHg
AV Peak grad: 9.9 mmHg
Ao pk vel: 1.57 m/s
Area-P 1/2: 5.06 cm2
Calc EF: 63.8 %
S' Lateral: 2.62 cm
Single Plane A2C EF: 59.4 %
Single Plane A4C EF: 67.8 %

## 2021-02-07 ENCOUNTER — Other Ambulatory Visit (HOSPITAL_COMMUNITY): Payer: Medicare Other

## 2021-02-09 ENCOUNTER — Encounter (HOSPITAL_COMMUNITY): Payer: Self-pay

## 2021-02-09 ENCOUNTER — Other Ambulatory Visit: Payer: Self-pay

## 2021-02-09 ENCOUNTER — Emergency Department (HOSPITAL_COMMUNITY)
Admission: EM | Admit: 2021-02-09 | Discharge: 2021-02-10 | Disposition: A | Discharge status: Home / Self Care | Payer: Medicare Other | Attending: Student | Admitting: Student

## 2021-02-09 DIAGNOSIS — I129 Hypertensive chronic kidney disease with stage 1 through stage 4 chronic kidney disease, or unspecified chronic kidney disease: Secondary | ICD-10-CM | POA: Diagnosis not present

## 2021-02-09 DIAGNOSIS — Z79899 Other long term (current) drug therapy: Secondary | ICD-10-CM | POA: Diagnosis not present

## 2021-02-09 DIAGNOSIS — E1122 Type 2 diabetes mellitus with diabetic chronic kidney disease: Secondary | ICD-10-CM | POA: Insufficient documentation

## 2021-02-09 DIAGNOSIS — Z20822 Contact with and (suspected) exposure to covid-19: Secondary | ICD-10-CM | POA: Insufficient documentation

## 2021-02-09 DIAGNOSIS — R42 Dizziness and giddiness: Secondary | ICD-10-CM | POA: Insufficient documentation

## 2021-02-09 DIAGNOSIS — N1831 Chronic kidney disease, stage 3a: Secondary | ICD-10-CM | POA: Insufficient documentation

## 2021-02-09 DIAGNOSIS — Z7982 Long term (current) use of aspirin: Secondary | ICD-10-CM | POA: Diagnosis not present

## 2021-02-09 DIAGNOSIS — Z7902 Long term (current) use of antithrombotics/antiplatelets: Secondary | ICD-10-CM | POA: Diagnosis not present

## 2021-02-09 DIAGNOSIS — Z7984 Long term (current) use of oral hypoglycemic drugs: Secondary | ICD-10-CM | POA: Diagnosis not present

## 2021-02-09 LAB — CBC
HCT: 32.3 % — ABNORMAL LOW (ref 36.0–46.0)
Hemoglobin: 10.3 g/dL — ABNORMAL LOW (ref 12.0–15.0)
MCH: 28.1 pg (ref 26.0–34.0)
MCHC: 31.9 g/dL (ref 30.0–36.0)
MCV: 88.3 fL (ref 80.0–100.0)
Platelets: 289 10*3/uL (ref 150–400)
RBC: 3.66 MIL/uL — ABNORMAL LOW (ref 3.87–5.11)
RDW: 13.7 % (ref 11.5–15.5)
WBC: 5.6 10*3/uL (ref 4.0–10.5)
nRBC: 0 % (ref 0.0–0.2)

## 2021-02-09 LAB — BASIC METABOLIC PANEL
Anion gap: 7 (ref 5–15)
BUN: 20 mg/dL (ref 8–23)
CO2: 25 mmol/L (ref 22–32)
Calcium: 9.4 mg/dL (ref 8.9–10.3)
Chloride: 106 mmol/L (ref 98–111)
Creatinine, Ser: 1.08 mg/dL — ABNORMAL HIGH (ref 0.44–1.00)
GFR, Estimated: 54 mL/min — ABNORMAL LOW (ref >60)
Glucose, Bld: 107 mg/dL — ABNORMAL HIGH (ref 70–99)
Potassium: 4 mmol/L (ref 3.5–5.1)
Sodium: 138 mmol/L (ref 135–145)

## 2021-02-09 LAB — CBG MONITORING, ED: Glucose-Capillary: 108 mg/dL — ABNORMAL HIGH (ref 70–99)

## 2021-02-09 LAB — URINALYSIS, ROUTINE W REFLEX MICROSCOPIC
Bilirubin Urine: NEGATIVE
Glucose, UA: NEGATIVE mg/dL
Hgb urine dipstick: NEGATIVE
Ketones, ur: NEGATIVE mg/dL
Leukocytes,Ua: NEGATIVE
Nitrite: NEGATIVE
Protein, ur: NEGATIVE mg/dL
Specific Gravity, Urine: 1.005 (ref 1.005–1.030)
pH: 7 (ref 5.0–8.0)

## 2021-02-09 LAB — RESP PANEL BY RT-PCR (FLU A&B, COVID) ARPGX2
Influenza A by PCR: NEGATIVE
Influenza B by PCR: NEGATIVE
SARS Coronavirus 2 by RT PCR: NEGATIVE

## 2021-02-09 LAB — TROPONIN I (HIGH SENSITIVITY): Troponin I (High Sensitivity): 3 ng/L (ref <18)

## 2021-02-09 MED ORDER — LACTATED RINGERS IV BOLUS
1000.0000 mL | Freq: Once | INTRAVENOUS | Status: AC
  Administered 2021-02-09: 22:00:00 1000 mL via INTRAVENOUS

## 2021-02-09 NOTE — ED Provider Notes (Signed)
[facility] DEPT Provider Note   CSN: [id number] Arrival date & time: 02/09/21  2051     History Chief Complaint  Patient presents with   Dizziness    Caroline Blair is a 75 y.o. female with PMH T2DM, HLD, recent admission for TIA with overall negative work-up, recent normal echo who presents emergency department for evaluation of lightheadedness and presyncope.  Patient states that she was sitting at the computer for a long period of time, went from sitting to standing and had an episode of lightheadedness.  The symptoms have since resolved.  She denies chest pain, shortness of breath, abdominal pain, nausea, vomiting or other systemic symptoms surrounding this episode.  Denies vertigo, numbness, tingling, weakness or other neurologic complaints.   Dizziness Associated symptoms: no chest pain, no palpitations, no shortness of breath and no vomiting       Past Medical History:  Diagnosis Date   Anemia    Cataract    Diabetes ([place])    Heart murmur    Hypertension     Patient Active Problem List   Diagnosis Date Noted   Hyperlipidemia 01/19/2021   Pedal edema 01/19/2021   TIA (transient ischemic attack) 01/18/2021   Chronic kidney disease, stage 3a ([place]) 01/18/2021   Nuclear sclerosis of both eyes 11/23/2015   Pseudophakia of both eyes 11/23/2015   Hypertensive retinopathy of both eyes 11/17/2014   Nuclear cataract of both eyes 04/10/2014   Hypertension    Heart murmur    Type 2 diabetes mellitus with chronic kidney disease, without long-term current use of insulin ([place])    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     Family History  Problem Relation Age of Onset   Diabetes Father    Colon cancer Neg Hx     Social History    Tobacco Use   Smoking status: Never   Smokeless tobacco: Never  Vaping Use   Vaping Use: Never used  Substance Use Topics   Alcohol use: Yes    Comment: rare glass wine   Drug use: No    Home Medications Prior to Admission medications   Medication Sig Start Date End Date Taking? Authorizing Provider  Acetaminophen (TYLENOL EXTRA STRENGTH PO) Take 500 mg by mouth as needed (arthritis pain).    [provider]  aspirin 81 MG tablet Take 1 tablet (81 mg total) by mouth daily for 21 days. 01/19/21 02/09/21  Lavina Hamman, MD  atorvastatin (LIPITOR) 40 MG tablet Take 40 mg by mouth daily.    [provider]  clopidogrel (PLAVIX) 75 MG tablet Take 1 tablet (75 mg total) by mouth daily. 01/20/21   Lavina Hamman, MD  Cyanocobalamin (VITAMIN B 12 PO) Take 1 tablet by mouth daily.    [provider]  furosemide (LASIX) 20 MG tablet Take 10 mg by mouth daily. 11/23/20   [provider]  glipiZIDE (GLUCOTROL XL) 5 MG 24 hr tablet Take 5 mg by mouth daily.    [provider]  losartan (COZAAR) 100 MG tablet Take 100 mg by mouth daily. 09/19/18   [provider]  metFORMIN (GLUCOPHAGE) 1000 MG tablet Take 1,000 mg by mouth 2 (two) times daily with a meal. 09/19/18   [provider]    Allergies    Patient has no known allergies.  Review of Systems   Review of Systems  Constitutional:  Negative for chills and fever.  HENT:  Negative for ear pain and sore throat.   Eyes:  Negative for pain and visual disturbance.  Respiratory:  Negative for cough and shortness of breath.   Cardiovascular:  Negative for chest pain and palpitations.  Gastrointestinal:  Negative for abdominal pain and vomiting.  Genitourinary:  Negative for dysuria and hematuria.  Musculoskeletal:  Negative for arthralgias and back pain.  Skin:  Negative for color change and rash.  Neurological:  Positive for dizziness. Negative for seizures and syncope.  All  other systems reviewed and are negative.  Physical Exam Updated Vital Signs BP (!) 143/61    Pulse 65    Temp 97.6 F (36.4 C) (Oral)    Resp 17    Ht 5\' 9"  (1.753 m)    Wt 79.6 kg    SpO2 100%    BMI 25.90 kg/m   Physical Exam Vitals and nursing note reviewed.  Constitutional:      General: She is not in acute distress.    Appearance: She is well-developed.  HENT:     Head: Normocephalic and atraumatic.  Eyes:     Conjunctiva/sclera: Conjunctivae normal.  Cardiovascular:     Rate and Rhythm: Normal rate and regular rhythm.     Heart sounds: No murmur heard. Pulmonary:     Effort: Pulmonary effort is normal. No respiratory distress.     Breath sounds: Normal breath sounds.  Abdominal:     Palpations: Abdomen is soft.     Tenderness: There is no abdominal tenderness.  Musculoskeletal:        General: No swelling.     Cervical back: Neck supple.  Skin:    General: Skin is warm and dry.     Capillary Refill: Capillary refill takes less than 2 seconds.  Neurological:     Mental Status: She is alert.     Cranial Nerves: No cranial nerve deficit.     Sensory: No sensory deficit.     Motor: No weakness.  Psychiatric:        Mood and Affect: Mood normal.    ED Results / Procedures / Treatments   Labs (all labs ordered are listed, but only abnormal results are displayed) Labs Reviewed  BASIC METABOLIC PANEL - Abnormal; Notable for the following components:      Result Value   Glucose, Bld 107 (*)    Creatinine, Ser 1.08 (*)    GFR, Estimated 54 (*)    All other components within normal limits  CBC - Abnormal; Notable for the following components:   RBC 3.66 (*)    Hemoglobin 10.3 (*)    HCT 32.3 (*)    All other components within normal limits  URINALYSIS, ROUTINE W REFLEX MICROSCOPIC - Abnormal; Notable for the following components:   Color, Urine STRAW (*)    All other components within normal limits  CBG MONITORING, ED - Abnormal; Notable for the following  components:   Glucose-Capillary 108 (*)    All other components within normal limits  RESP PANEL BY RT-PCR (FLU A&B, COVID) ARPGX2  TROPONIN I (HIGH SENSITIVITY)    EKG EKG Interpretation  Date/Time:  Wednesday February 09 2021 21:16:42 EST Ventricular Rate:  74 PR Interval:  135 QRS Duration: 89 QT Interval:  380 QTC Calculation: 422 R Axis:   54 Text  Interpretation: Sinus rhythm Confirmed by Honor Fairbank [id number]) on 02/09/2021 11:48:36 PM  Radiology No results found.  Procedures Procedures   Medications Ordered in ED Medications  lactated ringers bolus 1,000 mL (1,000 mLs Intravenous New Bag/Given 02/09/21 2220)    ED Course  I have reviewed the triage vital signs and the nursing notes.  Pertinent labs & imaging results that were available during my care of the patient were reviewed by me and considered in my medical decision making (see chart for details).    MDM Rules/Calculators/A&P                          Patient seen emergency department for evaluation of dizziness upon going from sitting to standing.  Physical exam is unremarkable.  Laboratory evaluation also unremarkable with improving anemia.  ECG nonischemic.  Urinalysis unremarkable.  COVID and flu negative.  Patient given 1 L lactated Ringer's and upon going from sitting to standing on reevaluation patient did not have recurrence of symptoms.  Patient presentation consistent with orthostatic presyncope in the setting of her Lasix use.  At this time she is safe for discharge and she was discharged with strict return precautions which he voiced understanding.   Final Clinical Impression(s) / ED Diagnoses Final diagnoses:  Orthostatic lightheadedness    Rx / DC Orders ED Discharge Orders     None        Zaylon Bossier, MD 02/09/21 2349

## 2021-02-09 NOTE — ED Notes (Signed)
Pt reports she was here on 11/30 for a TIA

## 2021-02-09 NOTE — ED Triage Notes (Signed)
Pt reports she felt dizzy this evening when standing up after sitting on couch watching television around 8pm. No LOC. It happened tonight a second time after standing up when walking. States she is not currently dizzy. Denies pain/n/v. Ambulatory with independent steady gait.

## 2021-02-22 NOTE — Progress Notes (Signed)
[facility] [address] [place]. [place] [address] [phone]       HOSPITAL FOLLOW UP NOTE  [name] Date of Birth:  March 25, 1945 Medical Record Number:  [id number]   Reason for Referral:  hospital stroke follow up    SUBJECTIVE:   CHIEF COMPLAINT:  Chief Complaint  Patient presents with   Follow-up    Rm 2 alone Pt is well, occasional lightheadedness but overall stable.      HPI:   Caroline Blair is a 76 year old female with history of hypertension, diabetes, hyperlipidemia, stage Ib endometrial cancer status post vaginal brachytherapy presented to ED on 01/18/2021 for acute onset of speech difficulty lasting approximately 20 to 25 minutes accompanied by mild headache.  Personally reviewed hospitalization record of progress notes, lab work and imaging.  Evaluated by Dr. Erlinda Hong - MR brain negative. DDx TIA vs HTN encephalopathy vs seizure vs complicated migraine. CTA head/neck unremarkable.   EEG no evidence of seizures.  LDL 41.  A1c 5.6. TTE unable to be completed in patient - recommended outpatient f/u.  Recommended DAPT for 3 weeks and Plavix alone and atorvastatin 40 mg daily.  No prior stroke history.  Discharged home in stable condition.    Today, 02/23/2021, patient being seen for initial hospital follow-up unaccompanied.  Overall stable without new stroke/TIA symptoms. She does experience increased anxiety/nervousness with fear of similar event occurring and occasional headaches and lightheadedness which are not new. Episode of lightheadedness/presyncope on 12/21 - eval [facility] ED with work-up unremarkable and improved after 1 L LR. Felt in setting of orthostatic presyncope in setting of furosemide use and discharged home with close PCP follow-up. F/u with PCP. Blood pressure today 150/78 - recently switched from lasix to amlodipine with BP at home 130-140/60s. She is concerned there levels are still high. Completed 3 weeks DAPT -remains on Plavix  alone as well as atorvastatin without side effects. Follows with PCP for diabetic management. Does endorse occasional AM headaches, daytime fatigue, nocturia and insomnia.  Has not previously had a sleep study. Completed TTE 12/15 with EF 60 to 65% - has f/u with cardiology 1/27 to review.  No further concerns at this time      PERTINENT IMAGING  CTA HEAD/NECK 01/18/2021 IMPRESSION: Largely unremarkable head and neck CTA. Minimal atherosclerosis without large vessel occlusion or significant proximal stenosis.  MR BRAIN  01/18/2021 IMPRESSION: 1. No acute intracranial abnormality. 2. Mild chronic small vessel ischemic disease. 3. 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 No evidence of recent hemorrhage.  EEG 01/19/2021 IMPRESSION: This study is suggestive of cortical dysfunction arising from right temporal region, nonspecific etiology. No seizures or epileptiform discharges were seen throughout the recording.     ROS:   14 system review of systems performed and negative with exception of those listed in HPI  PMH:  Past Medical History:  Diagnosis Date   Anemia    Cataract    Diabetes ([place])    Heart murmur    Hypertension     PSH:  Past Surgical History:  Procedure Laterality Date   DILATION AND CURETTAGE OF UTERUS  2006    Social History:  Social History   Socioeconomic History   Marital status: Married    Spouse name: Not on file   Number of children: Not on file   Years of education: Not on file   Highest education level: Not on file  Occupational History  Not on file  Tobacco Use   Smoking status: Never   Smokeless tobacco: Never  Vaping Use   Vaping Use: Never used  Substance and Sexual Activity   Alcohol use: Yes    Comment: rare glass wine   Drug use: No   Sexual activity: Not on file  Other Topics Concern   Not on file  Social History Narrative   Not on file   Social Determinants of Health    Financial Resource Strain: Not on file  Food Insecurity: Not on file  Transportation Needs: Not on file  Physical Activity: Not on file  Stress: Not on file  Social Connections: Not on file  Intimate Partner Violence: Not on file    Family History:  Family History  Problem Relation Age of Onset   Diabetes Father    Colon cancer Neg Hx     Medications:   Current Outpatient Medications on File Prior to Visit  Medication Sig Dispense Refill   Acetaminophen (TYLENOL EXTRA STRENGTH PO) Take 500 mg by mouth as needed (arthritis pain).     amLODipine (NORVASC) 2.5 MG tablet Take 2.5 mg by mouth daily.     atorvastatin (LIPITOR) 40 MG tablet Take 40 mg by mouth daily.     clopidogrel (PLAVIX) 75 MG tablet Take 1 tablet (75 mg total) by mouth daily. 120 tablet 0   Cyanocobalamin (VITAMIN B 12 PO) Take 1 tablet by mouth daily.     glipiZIDE (GLUCOTROL XL) 5 MG 24 hr tablet Take 5 mg by mouth daily.     losartan (COZAAR) 100 MG tablet Take 100 mg by mouth daily.     metFORMIN (GLUCOPHAGE) 1000 MG tablet Take 1,000 mg by mouth 2 (two) times daily with a meal.     No current facility-administered medications on file prior to visit.    Allergies:  No Known Allergies    OBJECTIVE:  Physical Exam  Vitals:   02/23/21 0910  BP: (!) 150/78  Pulse: 79  Weight: 172 lb (78 kg)  Height: 5\' 9"  (1.753 m)   Body mass index is 25.4 kg/m. No results found.  Depression screen PHQ 2/9 02/23/2021  Decreased Interest 0  Down, Depressed, Hopeless 1  PHQ - 2 Score 1     General: well developed, well nourished, pleasant elderly African-American female, seated, in no evident distress Head: head normocephalic and atraumatic.   Neck: supple with no carotid or supraclavicular bruits Cardiovascular: regular rate and rhythm, no murmurs Musculoskeletal: no deformity Skin:  no rash/petichiae Vascular:  Normal pulses all extremities   Neurologic Exam Mental Status: Awake and fully alert.   Fluent speech and language.  Oriented to place and time. Recent and remote memory intact. Attention span, concentration and fund of knowledge appropriate. Mood and affect appropriate.  Cranial Nerves: Fundoscopic exam reveals sharp disc margins. Pupils equal, briskly reactive to light. Extraocular movements full without nystagmus. Visual fields full to confrontation. Hearing intact. Facial sensation intact. Face, tongue, palate moves normally and symmetrically.  Motor: Normal bulk and tone. Normal strength in all tested extremity muscles Sensory.: intact to touch , pinprick , position and vibratory sensation.  Coordination: Rapid alternating movements normal in all extremities. Finger-to-nose and heel-to-shin performed accurately bilaterally. Gait and Station: Arises from chair without difficulty. Stance is normal. Gait demonstrates normal stride length and balance without use of assistive device. Tandem walk and heel toe without difficulty.  Reflexes: 1+ and symmetric. Toes downgoing.     NIHSS  0 Modified  [place]  0      ASSESSMENT: Caroline Blair is a 76 y.o. year old female with possible TIA on 01/18/2021 after presenting with 20 to 25-minute episode of speech difficulty. Vascular risk factors include DM, HLD, HTN.      PLAN:  TIA: no reoccurring symptoms. Mild anxiety - if persists, would recommend f/u with PCP but hopefully will continue to improve. Continue clopidogrel 75 mg daily  and atorvastatin 40 mg daily for secondary stroke prevention.  Discussed secondary stroke prevention measures and importance of close PCP follow up for aggressive stroke risk factor management. I have gone over the pathophysiology of stroke, warning signs and symptoms, risk factors and their management in some detail with instructions to go to the closest emergency room for symptoms of concern. HTN: BP goal <130/90.  Stable on amlodipine 2.5 mg daily per PCP HLD: LDL goal <70. Recent LDL 41 on  atorvastatin 40 mg daily.  DMII: A1c goal<7.0. Recent A1c 5.6.  Managed by PCP Suspected sleep apnea: Referral placed to [facility] - recent TIA, tx for HTN, insomnia, nocturia, daytime fatigue and AM headaches    Follow up in 6 months or call earlier if needed   CC:  [place] provider: Dr. Leonie Man PCP: London Pepper, MD    I spent 57 minutes of face-to-face and non-face-to-face time with patient.  This included previsit chart review including review of recent hospitalization, lab review, study review, order entry, electronic health record documentation, patient education regarding recent TIA including etiology, secondary stroke prevention measures and importance of managing stroke risk factors, suspected sleep apnea and answered all other questions to patient satisfaction   Frann Rider, AGNP-BC  [facility] [address] [place], [place] [address]  Phone [phone] Fax [phone] Note: This document was prepared with digital dictation and possible smart phrase technology. Any transcriptional errors that result from this process are unintentional.

## 2021-02-23 ENCOUNTER — Encounter: Payer: Self-pay | Admitting: Adult Health

## 2021-02-23 ENCOUNTER — Ambulatory Visit (INDEPENDENT_AMBULATORY_CARE_PROVIDER_SITE_OTHER): Payer: Medicare Other | Admitting: Adult Health

## 2021-02-23 VITALS — BP 150/78 | HR 79 | Ht 69.0 in | Wt 172.0 lb

## 2021-02-23 DIAGNOSIS — R29818 Other symptoms and signs involving the nervous system: Secondary | ICD-10-CM

## 2021-02-23 DIAGNOSIS — G459 Transient cerebral ischemic attack, unspecified: Secondary | ICD-10-CM | POA: Diagnosis not present

## 2021-02-23 NOTE — Progress Notes (Signed)
I agree with the above plan 

## 2021-02-23 NOTE — Patient Instructions (Addendum)
You will be called to our sleep clinic to be evaluated for possible sleep apnea  Continue clopidogrel 75 mg daily  and atorvastatin 40mg  daily  for secondary stroke prevention  Continue to follow up with PCP regarding cholesterol, blood pressure and diabetes management  Maintain strict control of hypertension with blood pressure goal below 130/90, diabetes with hemoglobin A1c goal below 7.0 % and cholesterol with LDL cholesterol (bad cholesterol) goal below 70 mg/dL.   Signs of a Stroke? Follow the BEFAST method:  Balance Watch for a sudden loss of balance, trouble with coordination or vertigo Eyes Is there a sudden loss of vision in one or both eyes? Or double vision?  Face: Ask the person to smile. Does one side of the face droop or is it numb?  Arms: Ask the person to raise both arms. Does one arm drift downward? Is there weakness or numbness of a leg? Speech: Ask the person to repeat a simple phrase. Does the speech sound slurred/strange? Is the person confused ? Time: If you observe any of these signs, call 911.     Followup in the future with me in 6 months or call earlier if needed       Thank you for coming to see [place] at [facility]. I hope we have been able to provide you high quality care today.  You may receive a patient satisfaction survey over the next few weeks. We would appreciate your feedback and comments so that we may continue to improve ourselves and the health of our patients.   Transient Ischemic Attack A transient ischemic attack (TIA) causes the same symptoms as a stroke, but the symptoms go away quickly. A TIA happens when blood flow to the brain is blocked. Having a TIA means you may be at risk for a stroke. A TIA is a medical emergency. What are the causes? A TIA is caused by a blocked artery in the head or neck. This means the brain does not get the blood supply it needs. A blockage can be caused by: Fatty buildup in an artery in the  head or neck. A blood clot. A tear in an artery. Irritation and swelling (inflammation) of an artery. Sometimes the cause is not known. What increases the risk? Certain things may make you more likely to have a TIA. Some of these are things that you can change, such as: Being very overweight. Using products that have nicotine or tobacco. Taking birth control pills. Not being active. Drinking too much alcohol. Using drugs. Health conditions that may increase your risk include: High blood pressure. High cholesterol. Diabetes. Heart disease. A heartbeat that is not regular (atrial fibrillation). Sickle cell disease. Sleep problems (sleep apnea). Long-term diseases that cause irritation and swelling. Problems with blood clotting. Other risk factors include: Being over the age of 68. Being female. Having a family history of stroke. Having had blood clots, stroke, TIA, or heart attack in the past. Having a history of high blood pressure when pregnant (preeclampsia). Very bad headaches (migraines). What are the signs or symptoms? The symptoms of a TIA are like those of a stroke. They can include: Weakness or loss of feeling in your face, arm, or leg. This often happens on one side of your body. Trouble walking. Trouble moving your arms or legs. Trouble talking or understanding what people are saying. Problems with how you see. Feeling dizzy. Feeling confused. Loss of balance or coordination. Feeling like you may vomit (nausea) or you vomit.  Having a very bad headache. If you can, note what time you started to have symptoms. Tell your doctor. How is this treated? The goal of treatment is to lower the risk for a stroke. This may include: Changes to diet and lifestyle, such as getting regular exercise and stopping smoking. Taking medicines to: Thin the blood. Lower blood pressure. Lower cholesterol. Treating other health conditions, such as diabetes. If testing shows that an  artery in your brain is narrow, your doctor may recommend a procedure to: Take the blockage out of your artery (carotid endarterectomy). Open or widen an artery in your neck (carotid angioplasty and stenting). Follow these instructions at home: Medicines Take over-the-counter and prescription medicines only as told by your doctor. If you were told to take aspirin or another medicine to thin your blood, take it exactly as told by your doctor. Taking too much of the medicine can cause bleeding. Taking too little of the medicine may not work to treat the problem. Eating and drinking  Eat 5 or more servings of fruits and vegetables each day. Follow instructions from your doctor about your diet. You may need to follow a certain diet to help lower your risk of a stroke. You may need to: Eat a diet that is low in fat and salt. Eat foods with a lot of fiber. Limit carbohydrates and sugar. If you drink alcohol: Limit how much you have to: 0-1 drink a day for women who are not pregnant. 0-2 drinks a day for men. Know how much alcohol is in a drink. In the [place] one drink equals one 12 oz bottle of beer (346mL), one 5 oz glass of wine (124mL), or one 1 oz glass of hard liquor (78mL). General instructions Keep a healthy weight. Try to get at least 30 minutes of exercise on most days. Get treatment if you have sleep problems. Do not smoke or use any products that contain nicotine or tobacco. If you need help quitting, ask your doctor. Do not use drugs. Keep all follow-up visits. Where to find more information American Stroke Association: www.stroke.org Get help right away if: You have chest pain. You have a heartbeat that is not regular. You have any signs of a stroke. "BE FAST" is an easy way to remember the main warning signs: B - Balance. Dizziness, sudden trouble walking, or loss of balance. E - Eyes. Trouble seeing or a change in how you see. F - Face. Sudden weakness or loss of feeling  of the face. The face or eyelid may droop on one side. A - Arms. Weakness or loss of feeling in an arm. This happens all of a sudden and most often on one side of the body. S - Speech. Sudden trouble speaking, slurred speech, or trouble understanding what people say. T - Time. Time to call emergency services. Write down what time symptoms started. You have other signs of a stroke, such as: A sudden, very bad headache with no known cause. Feeling like you may vomit. Vomiting. A seizure. These symptoms may be an emergency. Get help right away. Call your local emergency services (911 in the [place] Do not wait to see if the symptoms will go away. Do not drive yourself to the hospital. Summary A transient ischemic attack (TIA) happens when an artery in the head or neck is blocked. This causes the same symptoms as a stroke. The symptoms go away quickly. A TIA is a medical emergency. Get help right away, even if your  symptoms go away. Having a TIA means that you may be at risk for a stroke. Taking medicines and making diet and lifestyle changes can help to prevent a stroke. This information is not intended to replace advice given to you by your health care provider. Make sure you discuss any questions you have with your health care provider. Document Revised: 09/02/2019 Document Reviewed: 09/02/2019 Elsevier Patient Education  [place].

## 2021-03-01 ENCOUNTER — Other Ambulatory Visit: Payer: Self-pay | Admitting: Gastroenterology

## 2021-03-01 DIAGNOSIS — K862 Cyst of pancreas: Secondary | ICD-10-CM

## 2021-03-04 DIAGNOSIS — I10 Essential (primary) hypertension: Secondary | ICD-10-CM | POA: Diagnosis not present

## 2021-03-04 DIAGNOSIS — I491 Atrial premature depolarization: Secondary | ICD-10-CM | POA: Diagnosis not present

## 2021-03-04 DIAGNOSIS — Z9189 Other specified personal risk factors, not elsewhere classified: Secondary | ICD-10-CM | POA: Diagnosis not present

## 2021-03-04 DIAGNOSIS — E119 Type 2 diabetes mellitus without complications: Secondary | ICD-10-CM | POA: Diagnosis not present

## 2021-03-04 DIAGNOSIS — H35033 Hypertensive retinopathy, bilateral: Secondary | ICD-10-CM | POA: Diagnosis not present

## 2021-03-04 DIAGNOSIS — G459 Transient cerebral ischemic attack, unspecified: Secondary | ICD-10-CM | POA: Diagnosis not present

## 2021-03-04 DIAGNOSIS — E113593 Type 2 diabetes mellitus with proliferative diabetic retinopathy without macular edema, bilateral: Secondary | ICD-10-CM | POA: Diagnosis not present

## 2021-03-04 DIAGNOSIS — R5383 Other fatigue: Secondary | ICD-10-CM | POA: Diagnosis not present

## 2021-03-09 ENCOUNTER — Ambulatory Visit (INDEPENDENT_AMBULATORY_CARE_PROVIDER_SITE_OTHER): Payer: Medicare Other | Admitting: Neurology

## 2021-03-09 ENCOUNTER — Encounter: Payer: Self-pay | Admitting: Neurology

## 2021-03-09 VITALS — BP 129/61 | HR 77 | Ht 69.0 in | Wt 170.0 lb

## 2021-03-09 DIAGNOSIS — E663 Overweight: Secondary | ICD-10-CM

## 2021-03-09 DIAGNOSIS — Z8673 Personal history of transient ischemic attack (TIA), and cerebral infarction without residual deficits: Secondary | ICD-10-CM

## 2021-03-09 DIAGNOSIS — G4719 Other hypersomnia: Secondary | ICD-10-CM | POA: Diagnosis not present

## 2021-03-09 DIAGNOSIS — R351 Nocturia: Secondary | ICD-10-CM | POA: Diagnosis not present

## 2021-03-09 DIAGNOSIS — G4700 Insomnia, unspecified: Secondary | ICD-10-CM | POA: Diagnosis not present

## 2021-03-09 NOTE — Patient Instructions (Signed)

## 2021-03-09 NOTE — Progress Notes (Signed)
Subjective:    Patient ID: Caroline Blair is a 76 y.o. female.  HPI    Star Age, MD, PhD [facility] [address] P.O. [place],  [address]  Dear Janett Billow and Mamie Nick,   I saw your patient, Caroline Blair, upon your kind request, in my Sleep clinic today for initial consultation of her sleep disorder, in particular, concern for underlying obstructive sleep apnea.  The patient is unaccompanied today.  As you know, [name] is a 76 year old right-handed woman with an underlying medical history of TIA in November 2022, anemia, diabetes, hypertension, hyperlipidemia, history of heart murmur, uterine cancer, and borderline overweight state, who reports chronic difficulty particularly maintaining sleep.  She does not always wake up rested.  She has daytime tiredness.  She started having sleep-related symptoms when she was in perimenopause.  She had more daytime tiredness when she was still working.  She has been retired for several years.  She moved from [place] some 16 years ago.  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  She lives with her husband, they have 2 adult children, daughter is 37 years old and lives in this area, son is 27 and lives in [place].  Patient has 2 grown grandchildren.  She goes to bed between 9:30 PM and 11 PM and rise time is between 7 and 7:30 AM.  She does not have any recurrent morning headaches, she has nocturia about once per average night, she is not aware of any family history of sleep apnea.  She is familiar with the diagnosis as her husband has a CPAP machine.  She drinks caffeine in the form of tea, about 3 to 4 cups/day, has not had any alcohol in years, she is a non-smoker.  She has an Epworth sleepiness score of 8 out of 24, fatigue severity score 29 out of 63.  She had a tonsillectomy at age 30.  She only has had lymphedema since she had hysterectomy for her uterine cancer.   Around noring and excessive daytime somnolence.  I reviewed your office note from 02/23/2021.   Her Past Medical History Is Significant For: Past Medical History:  Diagnosis Date   Anemia    Cataract    Diabetes ([place])    Heart murmur    Hypertension     Her Past Surgical History Is Significant For: Past Surgical History:  Procedure Laterality Date   DILATION AND CURETTAGE OF UTERUS  2006    Her Family History Is Significant For: Family History  Problem Relation Age of Onset   Diabetes Father    Colon cancer Neg Hx     Her Social History Is Significant For: Social History   Socioeconomic History   Marital status: Married    Spouse name: Not on file   Number of children: Not on file   Years of education: Not on file   Highest education level: Not on file  Occupational History   Not on file  Tobacco Use   Smoking status: Never   Smokeless tobacco: Never  Vaping Use   Vaping Use: Never used  Substance and Sexual Activity   Alcohol use: Not Currently    Comment: rare glass wine; none in years however   Drug use: No   Sexual activity: Not on file  Other Topics Concern   Not on file  Social History Narrative   Lives at home with husband  Right handed   Caffeine: tea, 3-4 cups/day   Social Determinants of Health   Financial Resource Strain: Not on file  Food Insecurity: Not on file  Transportation Needs: Not on file  Physical Activity: Not on file  Stress: Not on file  Social Connections: Not on file    Her Allergies Are:  No Known Allergies:   Her Current Medications Are:  Outpatient Encounter Medications as of 03/09/2021  Medication Sig   Acetaminophen (TYLENOL EXTRA STRENGTH PO) Take 500 mg by mouth as needed (arthritis pain).   amLODipine (NORVASC) 5 MG tablet Take 5 mg by mouth daily.   atorvastatin (LIPITOR) 40 MG tablet Take 40 mg by mouth daily.   clopidogrel (PLAVIX) 75 MG tablet Take 1 tablet (75 mg total) by mouth daily.   Cyanocobalamin  (VITAMIN B 12 PO) Take 1 tablet by mouth daily.   JARDIANCE 10 MG TABS tablet Take 10 mg by mouth daily.   losartan (COZAAR) 100 MG tablet Take 100 mg by mouth daily.   metFORMIN (GLUCOPHAGE) 1000 MG tablet Take 1,000 mg by mouth 2 (two) times daily with a meal.   [DISCONTINUED] amLODipine (NORVASC) 2.5 MG tablet Take 2.5 mg by mouth daily.   [DISCONTINUED] glipiZIDE (GLUCOTROL XL) 5 MG 24 hr tablet Take 5 mg by mouth daily.   No facility-administered encounter medications on file as of 03/09/2021.  :   Review of Systems:  Out of a complete 14 point review of systems, all are reviewed and negative with the exception of these symptoms as listed below:   Review of Systems  Neurological:        Patient is here alone for a sleep consult. She had a recent TIA. She denies any trouble sleeping or snoring however she does note daytime sleepiness sometimes. She can drift off while doing work on a computer or while watching a video. ESS 8, FSS 29.   Objective:  Neurological Exam  Physical Exam Physical Examination:   Vitals:   03/09/21 1339  BP: 129/61  Pulse: 77    General Examination: The patient is a very pleasant 76 y.o. female in no acute distress. She appears well-developed and well-nourished and well groomed.   HEENT: Normocephalic, atraumatic, pupils are equal, round and reactive to light, extraocular tracking is good without limitation to gaze excursion or nystagmus noted. Hearing is grossly intact. Face is symmetric with normal facial animation. Speech is clear with no dysarthria noted. There is no hypophonia. There is no lip, neck/head, jaw or voice tremor. Neck is supple with full range of passive and active motion. There are no carotid bruits on auscultation. Oropharynx exam reveals: Mild mouth dryness, good dental hygiene with full dentures on top and partials on the bottom, mild airway crowding secondary to redundant soft palate, slightly elongated uvula.  Tonsils absent.   Mallampati class III.  Neck circumference of 13 5/8 inches.  Tongue protrudes centrally and palate elevates symmetrically.  Chest: Clear to auscultation without wheezing, rhonchi or crackles noted.  Heart: S1+S2+0, regular and normal without murmurs, rubs or gallops noted.   Abdomen: Soft, non-tender and non-distended.  Extremities: There is nonpitting swelling in both lower extremities, left more than right, bilateral compression socks in place.  She has a history of lymphedema.   Skin: Warm and dry without trophic changes noted.   Musculoskeletal: exam reveals no obvious joint deformitie.   Neurologically:  Mental status: The patient is awake, alert and oriented in all 4 spheres. Her immediate and remote  memory, attention, language skills and fund of knowledge are appropriate. There is no evidence of aphasia, agnosia, apraxia or anomia. Speech is clear with normal prosody and enunciation. Thought process is linear. Mood is normal and affect is normal.  Cranial nerves II - XII are as described above under HEENT exam.  Motor exam: Normal bulk, strength and tone is noted. There is no tremor, Romberg is negative. Reflexes are 2+ throughout. Fine motor skills and coordination: grossly intact.  Cerebellar testing: No dysmetria or intention tremor. There is no truncal or gait ataxia.  Sensory exam: intact to light touch in the upper and lower extremities.  Gait, station and balance: She stands easily. No veering to one side is noted. No leaning to one side is noted. Posture is age-appropriate and stance is narrow based. Gait shows normal stride length and normal pace. No problems turning are noted.    Assessment and Plan:  In summary, KAELEIGH WESTENDORF is a very pleasant 76 y.o.-year old female ith an underlying medical history of TIA in November 2022, anemia, diabetes, hypertension, hyperlipidemia, history of heart murmur, uterine cancer, and borderline overweight state, whose history and physical  exam are concerning for obstructive sleep apnea (OSA). I had a long chat with the patient about my findings and the diagnosis of OSA, its prognosis and treatment options. We talked about medical treatments, surgical interventions and non-pharmacological approaches. I explained in particular the risks and ramifications of untreated moderate to severe OSA, especially with respect to developing cardiovascular disease down the Road, including congestive heart failure, difficult to treat hypertension, cardiac arrhythmias, or stroke. Even type 2 diabetes has, in part, been linked to untreated OSA. Symptoms of untreated OSA include daytime sleepiness, memory problems, mood irritability and mood disorder such as depression and anxiety, lack of energy, as well as recurrent headaches, especially morning headaches. We talked about trying to maintain a healthy lifestyle in general, as well as the importance of weight control. We also talked about the importance of good sleep hygiene. I recommended the following at this time: sleep study.  I outlined the differences between a laboratory attended sleep study versus home sleep test. I explained the sleep test procedure to the patient and also outlined possible surgical and non-surgical treatment options of OSA, including the use of a custom-made dental device (which would require a referral to a specialist dentist or oral surgeon), upper airway surgical options, such as traditional UPPP or a novel less invasive surgical option in the form of Inspire hypoglossal nerve stimulation (which would involve a referral to an ENT surgeon). I also explained the CPAP treatment option to the patient, who indicated that she would be willing to try CPAP if the need arises. We will keep up our discussion after testing.  We will keep her posted as to her test results by phone call and plan a follow-up in sleep clinic accordingly.  Thank you very much for allowing me to participate in the care  of this nice patient. If I can be of any further assistance to you please do not hesitate to talk to me.  Sincerely,   Star Age, MD, PhD

## 2021-03-13 ENCOUNTER — Other Ambulatory Visit: Payer: Self-pay

## 2021-03-13 ENCOUNTER — Ambulatory Visit
Admission: RE | Admit: 2021-03-13 | Discharge: 2021-03-13 | Disposition: A | Discharge status: Home / Self Care | Payer: Medicare Other | Source: Ambulatory Visit | Attending: Gastroenterology | Admitting: Gastroenterology

## 2021-03-13 DIAGNOSIS — K862 Cyst of pancreas: Secondary | ICD-10-CM

## 2021-03-13 DIAGNOSIS — R935 Abnormal findings on diagnostic imaging of other abdominal regions, including retroperitoneum: Secondary | ICD-10-CM | POA: Diagnosis not present

## 2021-03-13 MED ORDER — GADOBENATE DIMEGLUMINE 529 MG/ML IV SOLN
15.0000 mL | Freq: Once | INTRAVENOUS | Status: AC | PRN
  Administered 2021-03-13: 15 mL via INTRAVENOUS

## 2021-03-23 DIAGNOSIS — I10 Essential (primary) hypertension: Secondary | ICD-10-CM | POA: Diagnosis not present

## 2021-03-28 ENCOUNTER — Ambulatory Visit (INDEPENDENT_AMBULATORY_CARE_PROVIDER_SITE_OTHER): Payer: Medicare Other | Admitting: Neurology

## 2021-03-28 DIAGNOSIS — E663 Overweight: Secondary | ICD-10-CM

## 2021-03-28 DIAGNOSIS — G4700 Insomnia, unspecified: Secondary | ICD-10-CM

## 2021-03-28 DIAGNOSIS — R351 Nocturia: Secondary | ICD-10-CM

## 2021-03-28 DIAGNOSIS — G4733 Obstructive sleep apnea (adult) (pediatric): Secondary | ICD-10-CM | POA: Diagnosis not present

## 2021-03-28 DIAGNOSIS — G4720 Circadian rhythm sleep disorder, unspecified type: Secondary | ICD-10-CM

## 2021-03-28 DIAGNOSIS — G4719 Other hypersomnia: Secondary | ICD-10-CM

## 2021-03-28 DIAGNOSIS — Z8673 Personal history of transient ischemic attack (TIA), and cerebral infarction without residual deficits: Secondary | ICD-10-CM

## 2021-04-06 NOTE — Addendum Note (Signed)
Addended by: Star Age on: 04/06/2021 06:30 PM   Modules accepted: Orders

## 2021-04-06 NOTE — Procedures (Signed)
PATIENT'S NAME:  Caroline Blair, Caroline Blair DOB:      14-Mar-1945      MR#:    [id number]     DATE OF RECORDING: 03/28/2021 REFERRING M.D.:  Frann Rider, NP/Dr. Leonie Man Study Performed:   Baseline Polysomnogram HISTORY: 76 year old woman with a history of TIA in November 2022, anemia, diabetes, hypertension, hyperlipidemia, history of heart murmur, uterine cancer, and borderline overweight state, who reports chronic difficulty particularly maintaining sleep. The patient endorsed the Epworth Sleepiness Scale at 8 points. The patient's weight 170 pounds with a height of 69 (inches), resulting in a BMI of 25.1 kg/m2. The patient's neck circumference measured 13 inches.  CURRENT MEDICATIONS: Tylenol Estra Strength, Norvasc, Lipitor, Plavix, Vitamin B 12, Jardiance, Cozaar, Glucophage   PROCEDURE:  This is a multichannel digital polysomnogram utilizing the Somnostar 11.2 system.  Electrodes and sensors were applied and monitored per AASM Specifications.   EEG, EOG, Chin and Limb EMG, were sampled at 200 Hz.  ECG, Snore and Nasal Pressure, Thermal Airflow, Respiratory Effort, CPAP Flow and Pressure, Oximetry was sampled at 50 Hz. Digital video and audio were recorded.      BASELINE STUDY  Lights Out was at 21:56 and Lights On at 05:00.  Total recording time (TRT) was 424.5 minutes, with a total sleep time (TST) of 172 minutes.   The patient's sleep latency was 63 minutes, which is delayed. REM latency was 219.5 minutes, which is delayed. The sleep efficiency was 40.5%, which is markedly reduced.     SLEEP ARCHITECTURE: WASO (Wake after sleep onset) was 211.5 minutes with several longer periods of wakefulness and moderate sleep fragmentation noted.  There were 9.5 minutes in Stage N1, 87 minutes Stage N2, 57.5 minutes Stage N3 and 18 minutes in Stage REM.  The percentage of Stage N1 was 5.5%, Stage N2 was 50.6%, Stage N3 was 33.4%, which is increased, and Stage R (REM sleep) was 10.5%, which is reduced. The arousals  were noted as: 12 were spontaneous, 0 were associated with PLMs, 12 were associated with respiratory events.  RESPIRATORY ANALYSIS:  There were a total of 41 respiratory events:  39 obstructive apneas, 0 central apneas and 0 mixed apneas with a total of 39 apneas and an apnea index (AI) of 13.6 /hour. There were 2 hypopneas with a hypopnea index of .7 /hour. The patient also had 0 respiratory event related arousals (RERAs).      The total APNEA/HYPOPNEA INDEX (AHI) was 14.3/hour and the total RESPIRATORY DISTURBANCE INDEX was  14.3 /hour.  10 events occurred in REM sleep and 4 events in NREM. The REM AHI was  33.3 /hour, versus a non-REM AHI of 12.1. The patient spent 138.5 minutes of total sleep time in the supine position and 34 minutes in non-supine.. The supine AHI was 17.8 versus a non-supine AHI of 0.0.  OXYGEN SATURATION & C02:  The Wake baseline 02 saturation was 97%, with the lowest being 85%. Time spent below 89% saturation equaled 7 minutes. PERIODIC LIMB MOVEMENTS: The patient had a total of 0 Periodic Limb Movements.  The Periodic Limb Movement (PLM) index was 0 and the PLM Arousal index was 0/hour.  Audio and video analysis did not show any abnormal or unusual movements, behaviors, phonations or vocalizations. The patient took 1 bathroom break. Intermittent mild snoring was noted. The EKG was in keeping with normal sinus rhythm (NSR).  Post-study, the patient indicated that sleep was the same as usual.   IMPRESSION:  Obstructive Sleep Apnea (OSA) Dysfunctions associated  with sleep stages or arousal from sleep  RECOMMENDATIONS:  This study demonstrates overall mild (near-moderate) obstructive sleep apnea, severe in REM sleep with a total AHI of 14.3/hour, REM AHI of 33.3/hour, supine AHI of 17.8/hour and O2 nadir of 85%. The reduced sleep efficiency and reduced percentage of REM sleep may have underestimated her sleep disordered breathing to some degree. Given the patient's medical  history and sleep related complaints, treatment with positive airway pressure is recommended; this can be achieved in the form of autoPAP therapy/titration at home. Alternatively, a full-night CPAP titration study would allow optimization of therapy if needed. Other treatment options may include avoidance of supine sleep position along with some weight loss, or dental treatment with an oral appliance in certain patients (through dentistry or orthodontics). Please note that untreated obstructive sleep apnea may carry additional perioperative morbidity. Patients with significant obstructive sleep apnea should receive perioperative PAP therapy and the surgeons and particularly the anesthesiologist should be informed of the diagnosis and the severity of the sleep disordered breathing. This study shows sleep fragmentation and abnormal sleep stage percentages; these are nonspecific findings and per se do not signify an intrinsic sleep disorder or a cause for the patient's sleep-related symptoms. Causes include (but are not limited to) the first night effect of the sleep study, circadian rhythm disturbances, medication effect or an underlying mood disorder or medical problem.  The patient should be cautioned not to drive, work at heights, or operate dangerous or heavy equipment when tired or sleepy. Review and reiteration of good sleep hygiene measures should be pursued with any patient. The patient and her referring provider will be notified of the test results.  I certify that I have reviewed the entire raw data recording prior to the issuance of this report in accordance with the Standards of Accreditation of the American Academy of Sleep Medicine (AASM)   Star Age, MD, PhD Diplomat, American Board of Neurology and Sleep Medicine (Neurology and Sleep Medicine)

## 2021-04-07 ENCOUNTER — Telehealth: Payer: Self-pay | Admitting: *Deleted

## 2021-04-07 DIAGNOSIS — G4733 Obstructive sleep apnea (adult) (pediatric): Secondary | ICD-10-CM

## 2021-04-07 NOTE — Telephone Encounter (Signed)
-----   Message from Star Age, MD sent at 04/06/2021  6:30 PM EST ----- Patient referred by Janett Billow and Dr. Leonie Man, seen by me on 03/09/21, diagnostic PSG on 03/28/21.    Please call and notify the patient that the recent sleep study showed mild/near-moderate sleep apnea. Given her medical Hx of sleep related complaints, I recommend treatment with an autoPAP/CPAP like machine to see if she feels better after treatment. I have placed an order in her chart. Please send order if she is agreeable to starting PAP treatment. The DME representative will educate her on how to use the machine, how to put the mask on, etc. I have placed an order in the chart. Please send referral, talk to patient, send report to referring MD. We will need a FU in sleep clinic for 10 weeks post-PAP set up, please arrange that with me or one of our NPs. Thanks,   Star Age, MD, PhD [facility])

## 2021-04-07 NOTE — Telephone Encounter (Signed)
LMVM for pt to return call at her convenience.

## 2021-04-11 NOTE — Telephone Encounter (Signed)
Autopap order and all supporting info faxed to [place]. Received a receipt of confirmation.

## 2021-04-11 NOTE — Telephone Encounter (Signed)
Spoke with the patient and discussed her sleep study results.  The patient is amenable to using an AutoPap machine.  She has no preference of DME company but she lives in [place] and is open to our suggestion.  I recommended [facility].  She understands she will receive a call from them in approximately 48 hours and then they will obtain her machine and get her set up.  They will also show her how to use the machine, fit her with a mask, tell her how to clean the machine etc. Her questions were answered.  She understands compliance requirements of using the machine at least 4 hours at night and also to follow-up in the clinic between 31 and 90 days after set up.  We went ahead and scheduled a follow-up appointment for Tuesday, April 25 at 8:45 AM arrival 15 to 30 minutes early with machine and power cord.  Patient verbalized appreciation for the call. She prefers the f/u message to this phone call to be sent via mychart vs a letter.

## 2021-04-11 NOTE — Addendum Note (Signed)
Addended by: Gildardo Griffes on: 04/11/2021 04:29 PM   Modules accepted: Orders

## 2021-04-11 NOTE — Telephone Encounter (Signed)
Pt LVM returning phone, would like a call back.

## 2021-04-11 NOTE — Telephone Encounter (Signed)
I called the pt and had to LVM for her asking for call back.

## 2021-04-11 NOTE — Telephone Encounter (Signed)
Pt would like a call back to discuss if she needs to make an appt

## 2021-06-06 ENCOUNTER — Other Ambulatory Visit: Payer: Self-pay | Admitting: Gastroenterology

## 2021-06-06 DIAGNOSIS — K862 Cyst of pancreas: Secondary | ICD-10-CM | POA: Diagnosis not present

## 2021-06-06 DIAGNOSIS — Z7901 Long term (current) use of anticoagulants: Secondary | ICD-10-CM | POA: Diagnosis not present

## 2021-06-09 DIAGNOSIS — C541 Malignant neoplasm of endometrium: Secondary | ICD-10-CM | POA: Diagnosis not present

## 2021-06-14 ENCOUNTER — Encounter: Payer: Self-pay | Admitting: Neurology

## 2021-06-14 ENCOUNTER — Ambulatory Visit (INDEPENDENT_AMBULATORY_CARE_PROVIDER_SITE_OTHER): Payer: Medicare Other | Admitting: Neurology

## 2021-06-14 VITALS — BP 132/68 | HR 55 | Ht 69.0 in | Wt 167.8 lb

## 2021-06-14 DIAGNOSIS — G4733 Obstructive sleep apnea (adult) (pediatric): Secondary | ICD-10-CM

## 2021-06-14 DIAGNOSIS — Z789 Other specified health status: Secondary | ICD-10-CM

## 2021-06-14 DIAGNOSIS — Z9989 Dependence on other enabling machines and devices: Secondary | ICD-10-CM | POA: Diagnosis not present

## 2021-06-14 NOTE — Progress Notes (Signed)
Subjective:  ?  ?Patient ID: Caroline Blair is a 76 y.o. female. ? ?HPI ? ? ? ?Interim history:  ? ?Caroline Blair is a 76 year old right-handed woman with an underlying medical history of TIA in November 2022, anemia, diabetes, hypertension, hyperlipidemia, history of heart murmur, uterine cancer, and borderline overweight state, who presents for follow-up consultation of her obstructive sleep apnea after interim testing and starting AutoPap therapy.  The patient is unaccompanied today.  I first met her at the request of Frann Rider, NP and Dr. Leonie Man on 02/2021, at which time she reported chronic difficulty maintaining sleep.  She had suffered a TIA in November 2022.  She was advised to proceed with a sleep study.  She had a nocturnal polysomnogram on 03/28/2021 which showed a decrease sleep efficiency of 40.5%, REM latency delayed at 219.5 minutes and sleep latency delayed at 63 minutes.  She had several longer periods of wakefulness and moderate sleep fragmentation.  Her total AHI was 14.3/h.  REM AHI was 33.3/h, supine AHI was 17.8/h.  Average oxygen saturation was 97%, nadir was 85%.  She was advised to start AutoPap therapy.  Her set up date was 04/27/2021.  She has a ResMed air sense 11 AutoSet machine. ? ?Today, 06/14/2021: I reviewed her AutoPap compliance data from 04/27/2021 through 05/26/2021, which is a total of 30 days, during which time she used her machine every night with percent use days greater than 4 hours at 100%, indicating superb compliance, average usage of 7 hours and 39 minutes, residual AHI at goal at 2.7/h, average pressure for the 95th percentile at 8.7 cm with a range of 4 to 11 cm with EPR.  Leak acceptable with the 95th percentile at 9.3 L/min.  She reports doing fairly well, feels like she has adjusted reasonably well to treatment, started off with the nasal pillows for about a week but then switched to a full facemask.  She is using a medium fullface mask from 3B, [place]. Her leak  significantly reduced after she switched to the FFM from the nasal pillows. She feels she sleeps better, as far as her sleep quality, not necessarily sleeping longer but she is very motivated to continue with treatment.  The nasal pillows irritated her nostrils and the fullface mask does irritate her nasal bridge and the side of her nose, she had a small cut in the beginning on the left side by her nose. ? ?The patient's allergies, current medications, family history, past medical history, past social history, past surgical history and problem list were reviewed and updated as appropriate.  ? ?Previously:  ? ?03/09/21: (She) reports chronic difficulty particularly maintaining sleep.  She does not always wake up rested.  She has daytime tiredness.  She started having sleep-related symptoms when she was in perimenopause.  She had more daytime tiredness when she was still working.  She has been retired for several years.  She moved from [place] some 16 years ago.  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  She lives with her husband, they have 2 adult children, daughter is 65 years old and lives in this area, son is 3 and lives in [place].  Patient has 2 grown grandchildren.  She goes to bed between 9:30 PM and 11 PM and rise time is between 7 and 7:30 AM.  She does not have any recurrent morning headaches, she has nocturia about once per average night, she is not aware of  any family history of sleep apnea.  She is familiar with the diagnosis as her husband has a CPAP machine.  She drinks caffeine in the form of tea, about 3 to 4 cups/day, has not had any alcohol in years, she is a non-smoker.  She has an Epworth sleepiness score of 8 out of 24, fatigue severity score 29 out of 63.  She had a tonsillectomy at age 30.  She only has had lymphedema since she had hysterectomy for her uterine cancer.  Around noring and excessive daytime somnolence.  I reviewed your office note from  02/23/2021. ? ?Her Past Medical History Is Significant For: ?Past Medical History:  ?Diagnosis Date  ? Anemia   ? Cataract   ? Diabetes ([place])   ? Heart murmur   ? Hypertension   ? TIA (transient ischemic attack)   ? ? ?Her Past Surgical History Is Significant For: ?Past Surgical History:  ?Procedure Laterality Date  ? DILATION AND CURETTAGE OF UTERUS  2006  ? ? ?Her Family History Is Significant For: ?Family History  ?Problem Relation Age of Onset  ? Diabetes Father   ? Colon cancer Neg Hx   ? Sleep apnea Neg Hx   ? ? ?Her Social History Is Significant For: ?Social History  ? ?Socioeconomic History  ? Marital status: Married  ?  Spouse name: Not on file  ? Number of children: Not on file  ? Years of education: Not on file  ? Highest education level: Not on file  ?Occupational History  ? Not on file  ?Tobacco Use  ? Smoking status: Never  ? Smokeless tobacco: Never  ?Vaping Use  ? Vaping Use: Never used  ?Substance and Sexual Activity  ? Alcohol use: Not Currently  ?  Comment: rare glass wine; none in years however  ? Drug use: No  ? Sexual activity: Not on file  ?Other Topics Concern  ? Not on file  ?Social History Narrative  ? Lives at home with husband   ? Right handed  ? Caffeine: tea, 3-4 cups/day  ? ?Social Determinants of Health  ? ?Financial Resource Strain: Not on file  ?Food Insecurity: Not on file  ?Transportation Needs: Not on file  ?Physical Activity: Not on file  ?Stress: Not on file  ?Social Connections: Not on file  ? ? ?Her Allergies Are:  ?No Known Allergies:  ? ?Her Current Medications Are:  ?Outpatient Encounter Medications as of 06/14/2021  ?Medication Sig  ? Acetaminophen (TYLENOL EXTRA STRENGTH PO) Take 500 mg by mouth as needed (arthritis pain).  ? amLODipine (NORVASC) 5 MG tablet Take 5 mg by mouth daily.  ? atorvastatin (LIPITOR) 40 MG tablet Take 40 mg by mouth daily.  ? clopidogrel (PLAVIX) 75 MG tablet Take 1 tablet (75 mg total) by mouth daily.  ? [facility] MAGNESIUM OXIDE 250 MG TABS Take 2  tablets by mouth daily.  ? Cyanocobalamin (VITAMIN B 12 PO) Take 1 tablet by mouth daily.  ? JARDIANCE 10 MG TABS tablet Take 10 mg by mouth daily.  ? losartan (COZAAR) 100 MG tablet Take 100 mg by mouth daily.  ? metFORMIN (GLUCOPHAGE) 1000 MG tablet Take 1,000 mg by mouth 2 (two) times daily with a meal.  ? ?No facility-administered encounter medications on file as of 06/14/2021.  ?: ? ?Review of Systems:  ?Out of a complete 14 point review of systems, all are reviewed and negative with the exception of these symptoms as listed below:  ? ?Review of Systems  ?  Neurological:   ?     Pt is here for CPAP follow up . Pt states she wants to go back to the nasal pillows. Pt is currently wearing a mask.Pt states she is not has fatigue during the day than she was before . Pt states she had a TIA November 2022   ? ?Objective:  ?Neurological Exam ? ?Physical Exam ?Physical Examination:  ? ?Vitals:  ? 06/14/21 0830  ?BP: 132/68  ?Pulse: (!) 55  ? ? ?General Examination: The patient is a very pleasant 76 y.o. female in no acute distress. She appears well-developed and well-nourished and well groomed.  ? ?HEENT: Normocephalic, atraumatic, pupils are equal, round and reactive to light, extraocular tracking is good without limitation to gaze excursion or nystagmus noted. Hearing is grossly intact. Face is symmetric with normal facial animation. Speech is clear with no dysarthria noted. There is no hypophonia. There is no lip, neck/head, jaw or voice tremor. Neck is supple with full range of passive and active motion. There are no carotid bruits on auscultation. Oropharynx exam reveals: Mild mouth dryness, good dental hygiene with full dentures on top and partials on the bottom, mild airway crowding secondary to redundant soft palate, slightly elongated uvula.  Tonsils absent.  Mallampati class III.  Neck circumference of 13 5/8 inches.  Tongue protrudes centrally and palate elevates symmetrically.  Mild irritation and slight  hyperpigmentation noted on both sides of her nose and nasal bridge. ?  ?Chest: Clear to auscultation without wheezing, rhonchi or crackles noted. ?  ?Heart: S1+S2+0, regular and normal without murmurs, rubs or gallops

## 2021-06-14 NOTE — Patient Instructions (Addendum)
It was nice to see you again today.  I am glad your AutoPap is working fairly well for you, you are fully compliant with it, keep up the good work!   ?Please continue using your autoPAP regularly. While your insurance requires that you use PAP at least 4 hours each night on 70% of the nights, I recommend, that you not skip any nights and use it throughout the night if you can. Getting used to PAP and staying with the treatment long term does take time and patience and discipline. Untreated obstructive sleep apnea when it is moderate to severe can have an adverse impact on cardiovascular health and raise her risk for heart disease, arrhythmias, hypertension, congestive heart failure, stroke and diabetes. Untreated obstructive sleep apnea causes sleep disruption, nonrestorative sleep, and sleep deprivation. This can have an impact on your day to day functioning and cause daytime sleepiness and impairment of cognitive function, memory loss, mood disturbance, and problems focussing. Using PAP regularly can improve these symptoms. ? ?For supplies and change in your mask, please get in touch with your DME provider. ?We can see you in 1 year in sleep clinic, you can see one of our nurse practitioners, you can see Janett Billow again.  ?

## 2021-06-15 ENCOUNTER — Encounter: Payer: Self-pay | Admitting: Neurology

## 2021-06-20 ENCOUNTER — Other Ambulatory Visit: Payer: Self-pay | Admitting: Internal Medicine

## 2021-06-20 DIAGNOSIS — Z0000 Encounter for general adult medical examination without abnormal findings: Secondary | ICD-10-CM | POA: Diagnosis not present

## 2021-06-20 DIAGNOSIS — I10 Essential (primary) hypertension: Secondary | ICD-10-CM | POA: Diagnosis not present

## 2021-06-20 DIAGNOSIS — E785 Hyperlipidemia, unspecified: Secondary | ICD-10-CM | POA: Diagnosis not present

## 2021-06-20 DIAGNOSIS — N183 Chronic kidney disease, stage 3 unspecified: Secondary | ICD-10-CM | POA: Diagnosis not present

## 2021-06-20 DIAGNOSIS — Z13820 Encounter for screening for osteoporosis: Secondary | ICD-10-CM | POA: Diagnosis not present

## 2021-06-20 DIAGNOSIS — G4733 Obstructive sleep apnea (adult) (pediatric): Secondary | ICD-10-CM | POA: Diagnosis not present

## 2021-06-20 DIAGNOSIS — Z66 Do not resuscitate: Secondary | ICD-10-CM | POA: Diagnosis not present

## 2021-06-20 DIAGNOSIS — E1165 Type 2 diabetes mellitus with hyperglycemia: Secondary | ICD-10-CM | POA: Diagnosis not present

## 2021-06-20 DIAGNOSIS — G459 Transient cerebral ischemic attack, unspecified: Secondary | ICD-10-CM | POA: Diagnosis not present

## 2021-06-20 DIAGNOSIS — C541 Malignant neoplasm of endometrium: Secondary | ICD-10-CM | POA: Diagnosis not present

## 2021-06-30 ENCOUNTER — Other Ambulatory Visit: Payer: Self-pay | Admitting: Internal Medicine

## 2021-06-30 DIAGNOSIS — Z1231 Encounter for screening mammogram for malignant neoplasm of breast: Secondary | ICD-10-CM

## 2021-07-22 DIAGNOSIS — Z9189 Other specified personal risk factors, not elsewhere classified: Secondary | ICD-10-CM | POA: Diagnosis not present

## 2021-07-22 DIAGNOSIS — E113593 Type 2 diabetes mellitus with proliferative diabetic retinopathy without macular edema, bilateral: Secondary | ICD-10-CM | POA: Diagnosis not present

## 2021-07-22 DIAGNOSIS — G459 Transient cerebral ischemic attack, unspecified: Secondary | ICD-10-CM | POA: Diagnosis not present

## 2021-07-22 DIAGNOSIS — I10 Essential (primary) hypertension: Secondary | ICD-10-CM | POA: Diagnosis not present

## 2021-07-22 DIAGNOSIS — H35033 Hypertensive retinopathy, bilateral: Secondary | ICD-10-CM | POA: Diagnosis not present

## 2021-07-22 DIAGNOSIS — R5383 Other fatigue: Secondary | ICD-10-CM | POA: Diagnosis not present

## 2021-08-15 ENCOUNTER — Ambulatory Visit
Admission: RE | Admit: 2021-08-15 | Discharge: 2021-08-15 | Disposition: A | Discharge status: Home / Self Care | Payer: Medicare Other | Source: Ambulatory Visit | Attending: Internal Medicine | Admitting: Internal Medicine

## 2021-08-15 DIAGNOSIS — Z1231 Encounter for screening mammogram for malignant neoplasm of breast: Secondary | ICD-10-CM | POA: Diagnosis not present

## 2021-08-25 ENCOUNTER — Ambulatory Visit: Payer: Medicare Other | Admitting: Adult Health

## 2021-08-31 NOTE — Progress Notes (Unsigned)
[facility] [address] [place]. [place] [address] [phone]       STROKE FOLLOW UP NOTE  [name] Date of Birth:  [birth year] Medical Record Number:  [id number]   Reason for Referral:  stroke follow up    SUBJECTIVE:   CHIEF COMPLAINT:  No chief complaint on file.   HPI:   Update 09/01/2021 JM: Patient returns for 89-monthstroke follow-up.  Doing well without new or reoccurring stroke/TIA symptoms.  Compliant on Plavix and atorvastatin, denies side effects.  Blood pressures today ***.  Completed sleep study back in February which showed sleep apnea and started on CPAP back in March.  Currently being followed by Dr. ARexene Albertswith recent visit in April.        History provided for reference purposes only Initial visit 02/23/2021 JM:  Patient being seen for initial hospital follow-up unaccompanied.  Overall stable without new stroke/TIA symptoms. She does experience increased anxiety/nervousness with fear of similar event occurring and occasional headaches and lightheadedness which are not new. Episode [facility]yncope [facility] 12[facility]21 [facility] and improved after 1 L LR. Felt in setting of orthostatic presyncope in setting of furosemide use and discharged home with close PCP follow-up. F/u with PCP. Blood pressure today 150/78 - recently switched from lasix to amlodipine with BP at home 130-140/60s. She is concerned there levels are still high. Completed 3 weeks DAPT -remains on Plavix alone as well as atorvastatin without side effects. Follows with PCP for diabetic management. Does endorse occasional AM headaches, daytime fatigue, nocturia and insomnia.  Has not previously had a sleep study. Completed TTE 12/15 with EF 60 to 65% - has f/u with cardiology 1/27 to review.  No further concerns at this time  Stroke admission 01/19/2019 Caroline FORMISANOis a 76year old female with history of hypertension, diabetes,  hyperlipidemia, stage Ib endometrial cancer status post vaginal brachytherapy presented to ED on 01/18/2021 for acute onset of speech difficulty lasting approximately 20 to 25 minutes accompanied by mild headache.  Personally reviewed hospitalization record of progress notes, lab work and imaging.  Evaluated by Dr. XErlinda Hong- MR brain negative. DDx TIA vs HTN encephalopathy vs seizure vs complicated migraine. CTA head/neck unremarkable.   EEG no evidence of seizures.  LDL 41.  A1c 5.6. TTE unable to be completed in patient - recommended outpatient f/u.  Recommended DAPT for 3 weeks and Plavix alone and atorvastatin 40 mg daily.  No prior stroke history.  Discharged home in stable condition.         PERTINENT IMAGING  CTA HEAD/NECK 01/18/2021 IMPRESSION: Largely unremarkable head and neck CTA. Minimal atherosclerosis without large vessel occlusion or significant proximal stenosis.  MR BRAIN  01/18/2021 IMPRESSION: 1. No acute intracranial abnormality. 2. Mild chronic small vessel ischemic disease. 3. 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 No evidence of recent hemorrhage.  EEG 01/19/2021 IMPRESSION: This study is suggestive of cortical dysfunction arising from right temporal region, nonspecific etiology. No seizures or epileptiform discharges were seen throughout the recording.     ROS:   14 system review of systems performed and negative with exception of those listed in HPI  PMH:  Past Medical History:  Diagnosis Date   Anemia    Cataract    Diabetes ([place]    Heart murmur    Hypertension    TIA (transient ischemic attack)     PSH:  Past Surgical History:  Procedure Laterality Date   DILATION AND CURETTAGE OF UTERUS  2006    Social History:  Social History   Socioeconomic History   Marital status: Married    Spouse name: Not on file   Number of children: Not on file   Years of education: Not on file   Highest  education level: Not on file  Occupational History   Not on file  Tobacco Use   Smoking status: Never   Smokeless tobacco: Never  Vaping Use   Vaping Use: Never used  Substance and Sexual Activity   Alcohol use: Not Currently    Comment: rare glass wine; none in years however   Drug use: No   Sexual activity: Not on file  Other Topics Concern   Not on file  Social History Narrative   Lives at home with husband    Right handed   Caffeine: tea, 3-4 cups/day   Social Determinants of Health   Financial Resource Strain: Not on file  Food Insecurity: Not on file  Transportation Needs: Not on file  Physical Activity: Not on file  Stress: Not on file  Social Connections: Not on file  Intimate Partner Violence: Not on file    Family History:  Family History  Problem Relation Age of Onset   Diabetes Father    Colon cancer Neg Hx    Sleep apnea Neg Hx    Breast cancer Neg Hx     Medications:   Current Outpatient Medications on File Prior to Visit  Medication Sig Dispense Refill   Acetaminophen (TYLENOL EXTRA STRENGTH PO) Take 500 mg by mouth as needed (arthritis pain).     amLODipine (NORVASC) 5 MG tablet Take 5 mg by mouth daily.     atorvastatin (LIPITOR) 40 MG tablet Take 40 mg by mouth daily.     clopidogrel (PLAVIX) 75 MG tablet Take 1 tablet (75 mg total) by mouth daily. 120 tablet 0   [facility]S MAGNESIUM OXIDE 250 MG TABS Take 2 tablets by mouth daily.     Cyanocobalamin (VITAMIN B 12 PO) Take 1 tablet by mouth daily.     JARDIANCE 10 MG TABS tablet Take 10 mg by mouth daily.     losartan (COZAAR) 100 MG tablet Take 100 mg by mouth daily.     metFORMIN (GLUCOPHAGE) 1000 MG tablet Take 1,000 mg by mouth 2 (two) times daily with a meal.     No current facility-administered medications on file prior to visit.    Allergies:  No Known Allergies    OBJECTIVE:  Physical Exam  There were no vitals filed for this visit.  There is no height or weight on file to calculate  BMI. No results found.  General: well developed, well nourished, pleasant elderly African-American female, seated, in no evident distress Head: head normocephalic and atraumatic.   Neck: supple with no carotid or supraclavicular bruits Cardiovascular: regular rate and rhythm, no murmurs Musculoskeletal: no deformity Skin:  no rash/petichiae Vascular:  Normal pulses all extremities   Neurologic Exam Mental Status: Awake and fully alert.  Fluent speech and language.  Oriented to place and time. Recent and remote memory intact. Attention span, concentration and fund of knowledge appropriate. Mood and affect appropriate.  Cranial Nerves: Pupils equal, briskly reactive to light. Extraocular movements full without nystagmus. Visual fields full to confrontation. Hearing intact. Facial sensation intact. Face, tongue, palate moves normally and symmetrically.  Motor: Normal bulk and tone. Normal strength in all tested extremity muscles Sensory.: intact  to touch , pinprick , position and vibratory sensation.  Coordination: Rapid alternating movements normal in all extremities. Finger-to-nose and heel-to-shin performed accurately bilaterally. Gait and Station: Arises from chair without difficulty. Stance is normal. Gait demonstrates normal stride length and balance without use of assistive device. Tandem walk and heel toe without difficulty.  Reflexes: 1+ and symmetric. Toes downgoing.         ASSESSMENT: ELIETTE DRUMWRIGHT is a 76 y.o. year old female with possible TIA on 01/18/2021 after presenting with 20 to 25-minute episode of speech difficulty. Vascular risk factors include DM, HLD, HTN.      PLAN:  TIA:  no reoccurring symptoms. Mild anxiety - if persists, would recommend f/u with PCP but hopefully will continue to improve.  Continue clopidogrel 75 mg daily  and atorvastatin 40 mg daily for secondary stroke prevention.   Discussed secondary stroke prevention measures and importance of  close PCP follow up for aggressive stroke risk factor management including BP goal<130/90, HLD with LDL goal<70 and DM with A1c.<7.  I have gone over the pathophysiology of stroke, warning signs and symptoms, risk factors and their management in some detail with instructions to go to the closest emergency room for symptoms of concern.  OSA on CPAP: Sleep study 03/2021 with initiation of CPAP 04/2021. Followed by Dr. Rexene Alberts    Follow up in 6 months or call earlier if needed   CC:  [place] provider: Dr. Leonie Man PCP: London Pepper, MD    I spent 57 minutes of face-to-face and non-face-to-face time with patient.  This included previsit chart review including review of recent hospitalization, lab review, study review, order entry, electronic health record documentation, patient education regarding recent TIA including etiology, secondary stroke prevention measures and importance of managing stroke risk factors, suspected sleep apnea and answered all other questions to patient satisfaction   Frann Rider, AGNP-BC  [facility] [address] [place], [place] [address]  Phone [phone] Fax [phone] Note: This document was prepared with digital dictation and possible smart phrase technology. Any transcriptional errors that result from this process are unintentional.

## 2021-09-01 ENCOUNTER — Encounter: Payer: Self-pay | Admitting: Adult Health

## 2021-09-01 ENCOUNTER — Ambulatory Visit (INDEPENDENT_AMBULATORY_CARE_PROVIDER_SITE_OTHER): Payer: Medicare Other | Admitting: Adult Health

## 2021-09-01 VITALS — BP 131/72 | HR 76 | Ht 69.0 in | Wt 166.5 lb

## 2021-09-01 DIAGNOSIS — Z8673 Personal history of transient ischemic attack (TIA), and cerebral infarction without residual deficits: Secondary | ICD-10-CM

## 2021-09-01 DIAGNOSIS — G4733 Obstructive sleep apnea (adult) (pediatric): Secondary | ICD-10-CM | POA: Diagnosis not present

## 2021-09-01 DIAGNOSIS — Z9989 Dependence on other enabling machines and devices: Secondary | ICD-10-CM

## 2021-09-14 ENCOUNTER — Encounter (HOSPITAL_COMMUNITY): Payer: Self-pay | Admitting: Gastroenterology

## 2021-09-21 ENCOUNTER — Encounter (HOSPITAL_COMMUNITY)
Admission: RE | Disposition: A | Discharge status: Home / Self Care | Payer: Self-pay | Source: Ambulatory Visit | Attending: Gastroenterology

## 2021-09-21 ENCOUNTER — Ambulatory Visit (HOSPITAL_COMMUNITY)
Admission: RE | Admit: 2021-09-21 | Discharge: 2021-09-21 | Disposition: A | Discharge status: Home / Self Care | Payer: Medicare Other | Source: Ambulatory Visit | Attending: Gastroenterology | Admitting: Gastroenterology

## 2021-09-21 ENCOUNTER — Encounter (HOSPITAL_COMMUNITY): Payer: Self-pay | Admitting: Gastroenterology

## 2021-09-21 ENCOUNTER — Ambulatory Visit (HOSPITAL_COMMUNITY): Payer: Medicare Other | Admitting: Certified Registered Nurse Anesthetist

## 2021-09-21 ENCOUNTER — Other Ambulatory Visit: Payer: Self-pay

## 2021-09-21 ENCOUNTER — Ambulatory Visit (HOSPITAL_BASED_OUTPATIENT_CLINIC_OR_DEPARTMENT_OTHER): Payer: Medicare Other | Admitting: Certified Registered Nurse Anesthetist

## 2021-09-21 DIAGNOSIS — N289 Disorder of kidney and ureter, unspecified: Secondary | ICD-10-CM | POA: Diagnosis not present

## 2021-09-21 DIAGNOSIS — Z7902 Long term (current) use of antithrombotics/antiplatelets: Secondary | ICD-10-CM | POA: Diagnosis not present

## 2021-09-21 DIAGNOSIS — K862 Cyst of pancreas: Secondary | ICD-10-CM

## 2021-09-21 DIAGNOSIS — Z7984 Long term (current) use of oral hypoglycemic drugs: Secondary | ICD-10-CM | POA: Insufficient documentation

## 2021-09-21 DIAGNOSIS — I10 Essential (primary) hypertension: Secondary | ICD-10-CM

## 2021-09-21 DIAGNOSIS — E119 Type 2 diabetes mellitus without complications: Secondary | ICD-10-CM | POA: Diagnosis not present

## 2021-09-21 DIAGNOSIS — K838 Other specified diseases of biliary tract: Secondary | ICD-10-CM | POA: Insufficient documentation

## 2021-09-21 DIAGNOSIS — R933 Abnormal findings on diagnostic imaging of other parts of digestive tract: Secondary | ICD-10-CM | POA: Diagnosis not present

## 2021-09-21 HISTORY — PX: UPPER ESOPHAGEAL ENDOSCOPIC ULTRASOUND (EUS): SHX6562

## 2021-09-21 HISTORY — PX: ESOPHAGOGASTRODUODENOSCOPY (EGD) WITH PROPOFOL: SHX5813

## 2021-09-21 LAB — GLUCOSE, CAPILLARY: Glucose-Capillary: 120 mg/dL — ABNORMAL HIGH (ref 70–99)

## 2021-09-21 SURGERY — UPPER ESOPHAGEAL ENDOSCOPIC ULTRASOUND (EUS)
Anesthesia: Monitor Anesthesia Care

## 2021-09-21 MED ORDER — LIDOCAINE 2% (20 MG/ML) 5 ML SYRINGE
INTRAMUSCULAR | Status: DC | PRN
  Administered 2021-09-21: 80 mg via INTRAVENOUS

## 2021-09-21 MED ORDER — PROPOFOL 1000 MG/100ML IV EMUL
INTRAVENOUS | Status: AC
  Filled 2021-09-21: qty 200

## 2021-09-21 MED ORDER — SODIUM CHLORIDE 0.9 % IV SOLN: INTRAVENOUS | Status: DC

## 2021-09-21 MED ORDER — PROPOFOL 500 MG/50ML IV EMUL
INTRAVENOUS | Status: AC
  Filled 2021-09-21: qty 100

## 2021-09-21 MED ORDER — CIPROFLOXACIN IN D5W 400 MG/200ML IV SOLN
INTRAVENOUS | Status: AC
  Filled 2021-09-21: qty 200

## 2021-09-21 MED ORDER — PROPOFOL 500 MG/50ML IV EMUL
INTRAVENOUS | Status: DC | PRN
  Administered 2021-09-21: 125 ug/kg/min via INTRAVENOUS

## 2021-09-21 MED ORDER — LACTATED RINGERS IV SOLN: INTRAVENOUS | Status: DC

## 2021-09-21 MED ORDER — PROPOFOL 10 MG/ML IV BOLUS
INTRAVENOUS | Status: DC | PRN
  Administered 2021-09-21: 30 mg via INTRAVENOUS
  Administered 2021-09-21 (×2): 20 mg via INTRAVENOUS

## 2021-09-21 NOTE — H&P (Signed)
[facility] H/P Note  Chief Complaint: Pancreatic cyst  HPI: Caroline Blair is an 76 y.o. female.  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  Asymptomatic.  Past Medical History:  Diagnosis Date   Anemia    Cataract    Diabetes ([place])    Heart murmur    Hypertension    TIA (transient ischemic attack)     Past Surgical History:  Procedure Laterality Date   DILATION AND CURETTAGE OF UTERUS  2006    Medications Prior to Admission  Medication Sig Dispense Refill   acetaminophen (TYLENOL) 500 MG tablet Take 1,000 mg by mouth every 8 (eight) hours as needed (arthritis pain).     amLODipine (NORVASC) 5 MG tablet Take 5 mg by mouth daily.     atorvastatin (LIPITOR) 40 MG tablet Take 40 mg by mouth daily.     clopidogrel (PLAVIX) 75 MG tablet Take 1 tablet (75 mg total) by mouth daily. 120 tablet 0   CVS MAGNESIUM OXIDE 250 MG TABS Take 2 tablets by mouth daily.     Cyanocobalamin (VITAMIN B 12 PO) Take 1,000 mcg by mouth daily.     empagliflozin (JARDIANCE) 25 MG TABS tablet Take 25 mg by mouth daily.     losartan (COZAAR) 100 MG tablet Take 100 mg by mouth daily.     metFORMIN (GLUCOPHAGE) 1000 MG tablet Take 1,000 mg by mouth 2 (two) times daily with a meal.      Allergies: No Known Allergies  Family History  Problem Relation Age of Onset   Diabetes Father    Colon cancer Neg Hx    Sleep apnea Neg Hx    Breast cancer Neg Hx     Social History:  reports that she has never smoked. She has never used smokeless tobacco. She reports that she does not currently use alcohol. She reports that she does not use drugs.   ROS: As Per HPI, all others negative   Blood pressure (!) 150/53, pulse 60, temperature 97.7 F (36.5 C), temperature source Temporal, resp. rate 19, height '5\' 9"'$  (1.753 m), weight 75.3 kg, SpO2 100 %. General appearance: NAD HEENT:  Chunky/AT, anicteric NECK:  Thin, supple CV:  Regular ABD:  Soft, non-tender NEURO:  A/O, non-focal, no encephalopathy  Results for  orders placed or performed during the hospital encounter of 09/21/21 (from the past 48 hour(s))  Glucose, capillary     Status: Abnormal   Collection Time: 09/21/21  8:42 AM  Result Value Ref Range   Glucose-Capillary 120 (H) 70 - 99 mg/dL    Comment: Glucose reference range applies only to samples taken after fasting for at least 8 hours.   No results found.  Assessment/Plan   Pancreatic cyst. Upper endoscopic ultrasound with possible FNA, possible cyst aspiration. Risks (bleeding, infection, bowel perforation that could require surgery, sedation-related changes in cardiopulmonary systems), benefits (identification and possible treatment of source of symptoms, exclusion of certain causes of symptoms), and alternatives (watchful waiting, radiographic imaging studies, empiric medical treatment) of upper endoscopy with ultrasound and possible fine needle aspiration (EUS +/- cyst aspiration +/- FNA) were explained to patient/family in detail and patient wishes to proceed.   Landry Dyke 09/21/2021, 9:36 AM

## 2021-09-21 NOTE — Op Note (Signed)
[facility] Patient Name: Caroline Blair Procedure Date: 09/21/2021 MRN: [id number] Attending MD: Arta Silence , MD Date of Birth: 1945/11/07 CSN: [id number] Age: 76 Admit Type: Inpatient Procedure:                Upper EUS Indications:              Pancreatic cyst on MRCP Providers:                Arta Silence, MD, Dulcy Fanny, Despina Pole, Technician Referring MD:             Dr. Ronnette Juniper Medicines:                Monitored Anesthesia Care Complications:            No immediate complications. Estimated Blood Loss:     Estimated blood loss: none. Procedure:                Pre-Anesthesia Assessment:                           - Prior to the procedure, a History and Physical                            was performed, and patient medications and                            allergies were reviewed. The patient's tolerance of                            previous anesthesia was also reviewed. The risks                            and benefits of the procedure and the sedation                            options and risks were discussed with the patient.                            All questions were answered, and informed consent                            was obtained. Prior Anticoagulants: The patient has                            taken Plavix (clopidogrel), last dose was 5 days                            prior to procedure. ASA Grade Assessment: III - A                            patient with severe systemic disease. After  reviewing the risks and benefits, the patient was                            deemed in satisfactory condition to undergo the                            procedure.                           After obtaining informed consent, the endoscope was                            passed under direct vision. Throughout the                            procedure, the patient's blood pressure, pulse, and                             oxygen saturations were monitored continuously. The                            [id number] ([id number]) Olympus linear ultrasound scope                            was introduced through the mouth, and advanced to                            the second part of duodenum. The upper EUS was                            accomplished without difficulty. The patient                            tolerated the procedure well. Scope In: Scope Out: Findings:      ENDOSONOGRAPHIC FINDING: :      There was no sign of significant endosonographic abnormality in the       common bile duct and in the common hepatic duct. The maximum diameter of       the ducts were 5 mm.      A small amount of hyperechoic material consistent with sludge was       visualized endosonographically in the gallbladder.      There was no sign of significant endosonographic abnormality in the       ampulla.      No lymphadenopathy seen.      There was no sign of significant endosonographic abnormality in the left       lobe of the liver. Homogeneous parenchyma was identified.      The pancreatic ductal side-branches had a dilated endosonographic       appearance in the pancreatic head, uncinate process of the pancreas and       body of the pancreas. The pancreatic duct side branches maximally       measured up to 8 mm in diameter. I could not appreciate any main       pancreatic ductal dilatation or changes. No pancreatic mass was seen. No  discrete "cyst" was seen. "Serpiginous" nature of lesion on MRI       described is best reflective of patient's prominently dilated pancreatic       ductal side branches. Impression:               - There was no sign of significant pathology in the                            common bile duct and in the common hepatic duct.                           - Hyperechoic material consistent with sludge was                            visualized endosonographically in the  gallbladder.                           - There was no sign of significant pathology in the                            ampulla.                           - There was no evidence of significant pathology in                            the left lobe of the liver.                           - The pancreatic duct had a dilated endosonographic                            appearance in the pancreatic head, uncinate process                            of the pancreas and body of the pancreas. The                            pancreatic duct measured up to 8 mm in diameter.                           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 Moderate Sedation:      None Recommendation:           - Discharge patient to home (via wheelchair).                           - Resume previous diet today.                           - Resume Plavix (clopidogrel) at prior dose today.                           -  Return to GI clinic as previously scheduled.                           - Surgical consultation. However, given patient's                            age and comorbidities, I suspect patient will be                            best suited for surveillance.                           - Return to referring physician as previously                            scheduled. Procedure Code(s):        --- Professional ---                           [phone], Esophagogastroduodenoscopy, flexible,                            transoral; with endoscopic ultrasound examination,                            including the esophagus, stomach, and either the                            duodenum or a surgically altered stomach where the                            jejunum is examined distal to the anastomosis Diagnosis Code(s):        --- Professional ---                           K86.2, Cyst of pancreas                           K83.8, Other specified diseases of biliary tract                            R93.3, Abnormal findings on diagnostic imaging of                            other parts of digestive tract CPT copyright 2019 [employer]. All rights reserved. The codes documented in this report are preliminary and upon coder review may  be revised to meet current compliance requirements. Arta Silence, MD 09/21/2021 10:27:59 AM This report has been signed electronically. Number of Addenda: 0

## 2021-09-21 NOTE — Discharge Instructions (Signed)
Endoscopy with ultrasound (EUS)  Care After Please read the instructions outlined below and refer to this sheet in the next few weeks. These discharge instructions provide you with general information on caring for yourself after you leave the hospital. Your doctor may also give you specific instructions. While your treatment has been planned according to the most current medical practices available, unavoidable complications occasionally occur. If you have any problems or questions after discharge, please call Dr. Paulita Fujita [facility]) at [phone].  HOME CARE INSTRUCTIONS Activity You may resume your regular activity but move at a slower pace for the next 24 hours.  Take frequent rest periods for the next 24 hours.  Walking will help expel (get rid of) the air and reduce the bloated feeling in your abdomen.  No driving for 24 hours (because of the anesthesia (medicine) used during the test).  You may shower.  Do not sign any important legal documents or operate any machinery for 24 hours (because of the anesthesia used during the test).  Nutrition Drink plenty of fluids.  You may resume your normal diet.  Begin with a light meal and progress to your normal diet.  Avoid alcoholic beverages for 24 hours or as instructed by your caregiver.  Medications You may resume your normal medications unless your caregiver tells you otherwise. What you can expect today You may experience abdominal discomfort such as a feeling of fullness or "gas" pains.  You may experience a sore throat for 2 to 3 days. This is normal. Gargling with salt water may help this.   SEEK IMMEDIATE MEDICAL CARE IF: You have excessive nausea (feeling sick to your stomach) and/or vomiting.  You have severe abdominal pain and distention (swelling).  You have trouble swallowing.  You have a temperature over 100 F (37.8 C).  You have rectal bleeding or vomiting of blood.  Document Released: 09/21/2003 Document  Revised: 10/19/2010 Document Reviewed: 04/03/2007 [facility] Patient Information 2012 [place].

## 2021-09-21 NOTE — Anesthesia Postprocedure Evaluation (Signed)
Anesthesia Post Note  Patient: Caroline Blair  Procedure(s) Performed: UPPER ESOPHAGEAL ENDOSCOPIC ULTRASOUND (EUS) (Bilateral) ESOPHAGOGASTRODUODENOSCOPY (EGD) WITH PROPOFOL     Patient location during evaluation: PACU Anesthesia Type: MAC Level of consciousness: awake and alert Pain management: pain level controlled Vital Signs Assessment: post-procedure vital signs reviewed and stable Respiratory status: spontaneous breathing, nonlabored ventilation, respiratory function stable and patient connected to nasal cannula oxygen Cardiovascular status: stable and blood pressure returned to baseline Postop Assessment: no apparent nausea or vomiting Anesthetic complications: no   No notable events documented.  Last Vitals:  Vitals:   09/21/21 1040 09/21/21 1045  BP: (!) 151/66 132/62  Pulse: 63 67  Resp: 20 17  Temp:    SpO2: 100% 100%    Last Pain:  Vitals:   09/21/21 1045  TempSrc:   PainSc: 0-No pain                 [name]

## 2021-09-21 NOTE — Transfer of Care (Signed)
Immediate Anesthesia Transfer of Care Note  Patient: Caroline Blair  Procedure(s) Performed: UPPER ESOPHAGEAL ENDOSCOPIC ULTRASOUND (EUS) (Bilateral) ESOPHAGOGASTRODUODENOSCOPY (EGD) WITH PROPOFOL  Patient Location: Endoscopy Unit  Anesthesia Type:MAC  Level of Consciousness: awake, alert  and oriented  Airway & Oxygen Therapy: Patient Spontanous Breathing and Patient connected to face mask oxygen  Post-op Assessment: Report given to RN and Post -op Vital signs reviewed and stable  Post vital signs: Reviewed and stable  Last Vitals:  Vitals Value Taken Time  BP 144/65 09/21/21 1024  Temp 36.6 C 09/21/21 1024  Pulse 86 09/21/21 1024  Resp 18 09/21/21 1024  SpO2 100 % 09/21/21 1024  Vitals shown include unvalidated device data.  Last Pain:  Vitals:   09/21/21 1024  TempSrc: Temporal  PainSc: 0-No pain         Complications: No notable events documented.

## 2021-09-21 NOTE — Anesthesia Preprocedure Evaluation (Signed)
Anesthesia Evaluation  Patient identified by MRN, date of birth, ID band Patient awake    Reviewed: Allergy & Precautions, NPO status , Patient's Chart, lab work & pertinent test results  Airway Mallampati: II  TM Distance: >3 FB Neck ROM: Full    Dental no notable dental hx.    Pulmonary neg pulmonary ROS,    Pulmonary exam normal breath sounds clear to auscultation       Cardiovascular hypertension, Normal cardiovascular exam Rhythm:Regular Rate:Normal     Neuro/Psych TIAnegative psych ROS   GI/Hepatic negative GI ROS, Neg liver ROS,   Endo/Other  diabetes  Renal/GU Renal InsufficiencyRenal disease  negative genitourinary   Musculoskeletal negative musculoskeletal ROS (+)   Abdominal   Peds negative pediatric ROS (+)  Hematology negative hematology ROS (+)   Anesthesia Other Findings   Reproductive/Obstetrics negative OB ROS                             Anesthesia Physical Anesthesia Plan  ASA: 3  Anesthesia Plan: MAC   Post-op Pain Management: Minimal or no pain anticipated   Induction: Intravenous  PONV Risk Score and Plan: 2 and Propofol infusion and Treatment may vary due to age or medical condition  Airway Management Planned: Simple Face Mask  Additional Equipment:   Intra-op Plan:   Post-operative Plan:   Informed Consent: I have reviewed the patients History and Physical, chart, labs and discussed the procedure including the risks, benefits and alternatives for the proposed anesthesia with the patient or authorized representative who has indicated his/her understanding and acceptance.     Dental advisory given  Plan Discussed with: CRNA and Surgeon  Anesthesia Plan Comments:         Anesthesia Quick Evaluation

## 2021-09-23 ENCOUNTER — Encounter (HOSPITAL_COMMUNITY): Payer: Self-pay | Admitting: Gastroenterology

## 2021-10-04 DIAGNOSIS — D490 Neoplasm of unspecified behavior of digestive system: Secondary | ICD-10-CM | POA: Diagnosis not present

## 2021-12-02 DIAGNOSIS — R5383 Other fatigue: Secondary | ICD-10-CM | POA: Diagnosis not present

## 2021-12-02 DIAGNOSIS — G459 Transient cerebral ischemic attack, unspecified: Secondary | ICD-10-CM | POA: Diagnosis not present

## 2021-12-02 DIAGNOSIS — E113593 Type 2 diabetes mellitus with proliferative diabetic retinopathy without macular edema, bilateral: Secondary | ICD-10-CM | POA: Diagnosis not present

## 2021-12-02 DIAGNOSIS — I10 Essential (primary) hypertension: Secondary | ICD-10-CM | POA: Diagnosis not present

## 2021-12-02 DIAGNOSIS — Z9189 Other specified personal risk factors, not elsewhere classified: Secondary | ICD-10-CM | POA: Diagnosis not present

## 2021-12-02 DIAGNOSIS — H35033 Hypertensive retinopathy, bilateral: Secondary | ICD-10-CM | POA: Diagnosis not present

## 2021-12-08 ENCOUNTER — Ambulatory Visit
Admission: RE | Admit: 2021-12-08 | Discharge: 2021-12-08 | Disposition: A | Discharge status: Home / Self Care | Payer: Medicare Other | Source: Ambulatory Visit | Attending: Internal Medicine | Admitting: Internal Medicine

## 2021-12-08 DIAGNOSIS — Z13820 Encounter for screening for osteoporosis: Secondary | ICD-10-CM

## 2021-12-08 DIAGNOSIS — Z780 Asymptomatic menopausal state: Secondary | ICD-10-CM | POA: Diagnosis not present

## 2021-12-09 DIAGNOSIS — E042 Nontoxic multinodular goiter: Secondary | ICD-10-CM | POA: Diagnosis not present

## 2021-12-09 DIAGNOSIS — Z23 Encounter for immunization: Secondary | ICD-10-CM | POA: Diagnosis not present

## 2021-12-09 DIAGNOSIS — R609 Edema, unspecified: Secondary | ICD-10-CM | POA: Diagnosis not present

## 2021-12-09 DIAGNOSIS — Z8639 Personal history of other endocrine, nutritional and metabolic disease: Secondary | ICD-10-CM | POA: Diagnosis not present

## 2021-12-09 DIAGNOSIS — I10 Essential (primary) hypertension: Secondary | ICD-10-CM | POA: Diagnosis not present

## 2022-01-20 DIAGNOSIS — N183 Chronic kidney disease, stage 3 unspecified: Secondary | ICD-10-CM | POA: Diagnosis not present

## 2022-01-20 DIAGNOSIS — I10 Essential (primary) hypertension: Secondary | ICD-10-CM | POA: Diagnosis not present

## 2022-01-20 DIAGNOSIS — E785 Hyperlipidemia, unspecified: Secondary | ICD-10-CM | POA: Diagnosis not present

## 2022-01-20 DIAGNOSIS — Z7984 Long term (current) use of oral hypoglycemic drugs: Secondary | ICD-10-CM | POA: Diagnosis not present

## 2022-01-20 DIAGNOSIS — Z8639 Personal history of other endocrine, nutritional and metabolic disease: Secondary | ICD-10-CM | POA: Diagnosis not present

## 2022-01-20 DIAGNOSIS — E1165 Type 2 diabetes mellitus with hyperglycemia: Secondary | ICD-10-CM | POA: Diagnosis not present

## 2022-02-01 ENCOUNTER — Other Ambulatory Visit: Payer: Self-pay | Admitting: General Surgery

## 2022-02-01 DIAGNOSIS — D490 Neoplasm of unspecified behavior of digestive system: Secondary | ICD-10-CM

## 2022-02-01 DIAGNOSIS — C541 Malignant neoplasm of endometrium: Secondary | ICD-10-CM

## 2022-02-14 DIAGNOSIS — Z7984 Long term (current) use of oral hypoglycemic drugs: Secondary | ICD-10-CM | POA: Diagnosis not present

## 2022-02-14 DIAGNOSIS — E113593 Type 2 diabetes mellitus with proliferative diabetic retinopathy without macular edema, bilateral: Secondary | ICD-10-CM | POA: Diagnosis not present

## 2022-02-14 DIAGNOSIS — H35033 Hypertensive retinopathy, bilateral: Secondary | ICD-10-CM | POA: Diagnosis not present

## 2022-02-14 DIAGNOSIS — Z961 Presence of intraocular lens: Secondary | ICD-10-CM | POA: Diagnosis not present

## 2022-02-14 DIAGNOSIS — H35371 Puckering of macula, right eye: Secondary | ICD-10-CM | POA: Diagnosis not present

## 2022-03-27 ENCOUNTER — Other Ambulatory Visit (HOSPITAL_COMMUNITY): Payer: Self-pay | Admitting: General Surgery

## 2022-03-27 DIAGNOSIS — D490 Neoplasm of unspecified behavior of digestive system: Secondary | ICD-10-CM

## 2022-03-29 ENCOUNTER — Ambulatory Visit (HOSPITAL_COMMUNITY)
Admission: RE | Admit: 2022-03-29 | Discharge: 2022-03-29 | Disposition: A | Discharge status: Home / Self Care | Payer: Medicare Other | Source: Ambulatory Visit | Attending: General Surgery | Admitting: General Surgery

## 2022-03-29 DIAGNOSIS — D490 Neoplasm of unspecified behavior of digestive system: Secondary | ICD-10-CM | POA: Insufficient documentation

## 2022-03-29 DIAGNOSIS — K862 Cyst of pancreas: Secondary | ICD-10-CM | POA: Diagnosis not present

## 2022-03-29 DIAGNOSIS — I700 Atherosclerosis of aorta: Secondary | ICD-10-CM | POA: Diagnosis not present

## 2022-03-29 MED ORDER — GADOBUTROL 1 MMOL/ML IV SOLN
7.0000 mL | Freq: Once | INTRAVENOUS | Status: AC | PRN
  Administered 2022-03-29: 7 mL via INTRAVENOUS

## 2022-06-09 DIAGNOSIS — G459 Transient cerebral ischemic attack, unspecified: Secondary | ICD-10-CM | POA: Diagnosis not present

## 2022-06-09 DIAGNOSIS — Z9189 Other specified personal risk factors, not elsewhere classified: Secondary | ICD-10-CM | POA: Diagnosis not present

## 2022-06-09 DIAGNOSIS — E113593 Type 2 diabetes mellitus with proliferative diabetic retinopathy without macular edema, bilateral: Secondary | ICD-10-CM | POA: Diagnosis not present

## 2022-06-09 DIAGNOSIS — R5383 Other fatigue: Secondary | ICD-10-CM | POA: Diagnosis not present

## 2022-06-09 DIAGNOSIS — H35033 Hypertensive retinopathy, bilateral: Secondary | ICD-10-CM | POA: Diagnosis not present

## 2022-06-09 DIAGNOSIS — I10 Essential (primary) hypertension: Secondary | ICD-10-CM | POA: Diagnosis not present

## 2022-06-15 ENCOUNTER — Ambulatory Visit (INDEPENDENT_AMBULATORY_CARE_PROVIDER_SITE_OTHER): Payer: Medicare Other | Admitting: Neurology

## 2022-06-15 ENCOUNTER — Encounter: Payer: Self-pay | Admitting: Neurology

## 2022-06-15 ENCOUNTER — Ambulatory Visit: Payer: Medicare Other | Admitting: Adult Health

## 2022-06-15 VITALS — BP 135/55 | HR 60 | Ht 69.0 in | Wt 169.0 lb

## 2022-06-15 DIAGNOSIS — G4733 Obstructive sleep apnea (adult) (pediatric): Secondary | ICD-10-CM

## 2022-06-15 NOTE — Patient Instructions (Signed)
It was nice to see you again. Keep up the good work with your autoPAP, you are fully compliant and settings are good. We can see you in 1 year, you can see Shanda Bumps again.   Please continue using your autoPAP regularly. While your insurance requires that you use PAP at least 4 hours each night on 70% of the nights, I recommend, that you not skip any nights and use it throughout the night if you can. Getting used to PAP and staying with the treatment long term does take time and patience and discipline. Untreated obstructive sleep apnea when it is moderate to severe can have an adverse impact on cardiovascular health and raise her risk for heart disease, arrhythmias, hypertension, congestive heart failure, stroke and diabetes. Untreated obstructive sleep apnea causes sleep disruption, nonrestorative sleep, and sleep deprivation. This can have an impact on your day to day functioning and cause daytime sleepiness and impairment of cognitive function, memory loss, mood disturbance, and problems focussing. Using PAP regularly can improve these symptoms.

## 2022-06-15 NOTE — Progress Notes (Signed)
Subjective:    Patient ID: Caroline Blair is a 77 y.o. female.  HPI    Interim history:   Caroline Blair is a 77 year old right-handed woman with an underlying medical history of TIA in November 2022, anemia, diabetes, hypertension, hyperlipidemia, history of heart murmur, uterine cancer, and borderline overweight state, who presents for follow-up consultation of Caroline Blair obstructive sleep apnea, on autoPAP therapy.  The patient is unaccompanied today and presents for Caroline Blair one year check up. I last saw Caroline Blair on 06/14/2021, at which time she reported doing fairly well. She was compliant with Caroline Blair AutoPap and benefited from it.  She did have some difficulty with Caroline Blair mask but was motivated to continue with treatment.  She indicated sleeping better with better sleep quality.  She was advised to follow-up routinely in sleep clinic in 1 year and continue follow-up with Shanda Bumps and Dr. Pearlean Brownie as planned as well.  Today, 06/15/2022: I reviewed Caroline Blair AutoPap compliance data from 05/15/2022 through 06/13/2022, which is a total of 30 days, during which time she used Caroline Blair machine every night with percent use days greater than 4 hours at 100%, indicating superb compliance, average usage of 7 hours and 38 minutes, residual AHI at goal at 2.8/h, 95th percentile of pressure at 9.2 cm with a range of 4 to 11 cm with EPR of 3.  Leak on the low side with the 95th percentile at 1 L/min.  She reports doing well, she is consistent with Caroline Blair usage, benefits from it overall but still has sleep disruption.  She has nocturia once per night but is very motivated to continue with treatment and has been compliant.  She has been using a fullface mask with good success.  She no longer uses the distilled water and humidifier in Caroline Blair machine, she does not have much in the way of dry mouth.  She tries to hydrate well during the day.  She has recently noticed a rash in the inner thigh and leg area, Caroline Blair cardiologist has advised Caroline Blair to give the amlodipine a  hiatus for now.  Caroline Blair blood pressure numbers are good even without the amlodipine.  She monitors BP at home as well.  She has an appointment with dermatology but it is not until August 2024.  She is up-to-date with Caroline Blair eye exam, saw them in December 2023.    The patient's allergies, current medications, family history, past medical history, past social history, past surgical history and problem list were reviewed and updated as appropriate.    Previously:     I first met Caroline Blair at the request of Ihor Austin, NP and Dr. Pearlean Brownie on 02/2021, at which time she reported chronic difficulty maintaining sleep.  She had suffered a TIA in November 2022.  She was advised to proceed with a sleep study.  She had a nocturnal polysomnogram on 03/28/2021 which showed a decrease sleep efficiency of 40.5%, REM latency delayed at 219.5 minutes and sleep latency delayed at 63 minutes.  She had several longer periods of wakefulness and moderate sleep fragmentation.  Caroline Blair total AHI was 14.3/h.  REM AHI was 33.3/h, supine AHI was 17.8/h.  Average oxygen saturation was 97%, nadir was 85%.  She was advised to start AutoPap therapy.  Caroline Blair set up date was 04/27/2021.  She has a ResMed air sense 11 AutoSet machine.   I reviewed Caroline Blair AutoPap compliance data from 04/27/2021 through 05/26/2021, which is a total of 30 days, during which time she used Caroline Blair machine every night with percent  use days greater than 4 hours at 100%, indicating superb compliance, average usage of 7 hours and 39 minutes, residual AHI at goal at 2.7/h, average pressure for the 95th percentile at 8.7 cm with a range of 4 to 11 cm with EPR.  Leak acceptable with the 95th percentile at 9.3 L/min.    03/09/21: (She) reports chronic difficulty particularly maintaining sleep.  She does not always wake up rested.  She has daytime tiredness.  She started having sleep-related symptoms when she was in perimenopause.  She had more daytime tiredness when she was still working.  She has been  retired for several years.  She moved from [place] some 16 years ago.  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  She lives with Caroline Blair husband, they have 2 adult children, daughter is 54 years old and lives in this area, son is 51 and lives in [place].  Patient has 2 grown grandchildren.  She goes to bed between 9:30 PM and 11 PM and rise time is between 7 and 7:30 AM.  She does not have any recurrent morning headaches, she has nocturia about once per average night, she is not aware of any family history of sleep apnea.  She is familiar with the diagnosis as Caroline Blair husband has a CPAP machine.  She drinks caffeine in the form of tea, about 3 to 4 cups/day, has not had any alcohol in years, she is a non-smoker.  She has an Epworth sleepiness score of 8 out of 24, fatigue severity score 29 out of 63.  She had a tonsillectomy at age 71.  She only has had lymphedema since she had hysterectomy for Caroline Blair uterine cancer.  Around noring and excessive daytime somnolence.  I reviewed your office note from 02/23/2021.   Caroline Blair Past Medical History Is Significant For: Past Medical History:  Diagnosis Date   Anemia    Cataract    Diabetes    Heart murmur    Hypertension    TIA (transient ischemic attack)     Caroline Blair Past Surgical History Is Significant For: Past Surgical History:  Procedure Laterality Date   DILATION AND CURETTAGE OF UTERUS  2006   ESOPHAGOGASTRODUODENOSCOPY (EGD) WITH PROPOFOL N/A 09/21/2021   Procedure: ESOPHAGOGASTRODUODENOSCOPY (EGD) WITH PROPOFOL;  Surgeon: Willis Modena, MD;  Location: [facility];  Service: Gastroenterology;  Laterality: N/A;   UPPER ESOPHAGEAL ENDOSCOPIC ULTRASOUND (EUS) Bilateral 09/21/2021   Procedure: UPPER ESOPHAGEAL ENDOSCOPIC ULTRASOUND (EUS);  Surgeon: Willis Modena, MD;  Location: [facility];  Service: Gastroenterology;  Laterality: Bilateral;    Caroline Blair Family History Is Significant For: Family History  Problem Relation Age of  Onset   Diabetes Father    Colon cancer Neg Hx    Sleep apnea Neg Hx    Breast cancer Neg Hx     Caroline Blair Social History Is Significant For: Social History   Socioeconomic History   Marital status: Married    Spouse name: Not on file   Number of children: Not on file   Years of education: Not on file   Highest education level: Not on file  Occupational History   Not on file  Tobacco Use   Smoking status: Never   Smokeless tobacco: Never  Vaping Use   Vaping Use: Never used  Substance and Sexual Activity   Alcohol use: Not Currently    Comment: rare glass wine; none in years however   Drug use: No   Sexual activity:  Not on file  Other Topics Concern   Not on file  Social History Narrative   Lives at home with husband    Right handed   Caffeine: tea, 3-4 cups/day   Social Determinants of Health   Financial Resource Strain: Not on file  Food Insecurity: Not on file  Transportation Needs: Not on file  Physical Activity: Not on file  Stress: Not on file  Social Connections: Not on file    Caroline Blair Allergies Are:  No Known Allergies:   Caroline Blair Current Medications Are:  Outpatient Encounter Medications as of 06/15/2022  Medication Sig   acetaminophen (TYLENOL) 500 MG tablet Take 1,000 mg by mouth every 8 (eight) hours as needed (arthritis pain).   atorvastatin (LIPITOR) 40 MG tablet Take 40 mg by mouth daily.   clopidogrel (PLAVIX) 75 MG tablet Take 1 tablet (75 mg total) by mouth daily.   [employer] MAGNESIUM OXIDE 250 MG TABS Take 2 tablets by mouth daily.   Cyanocobalamin (VITAMIN B 12 PO) Take 1,000 mcg by mouth daily.   empagliflozin (JARDIANCE) 25 MG TABS tablet Take 25 mg by mouth daily.   losartan (COZAAR) 100 MG tablet Take 100 mg by mouth daily.   metFORMIN (GLUCOPHAGE) 1000 MG tablet Take 1,000 mg by mouth 2 (two) times daily with a meal.   amLODipine (NORVASC) 5 MG tablet Take 5 mg by mouth daily. (Patient not taking: Reported on 06/15/2022)   No facility-administered  encounter medications on file as of 06/15/2022.  :  Review of Systems:  Out of a complete 14 point review of systems, all are reviewed and negative with the exception of these symptoms as listed below:   Review of Systems  Neurological:        Cpap Follow up.  ESS 8.  Wakes up in middle of night qulaity better then what was.     Objective:  Neurological Exam  Physical Exam Physical Examination:   Vitals:   06/15/22 0944  BP: (!) 135/55  Pulse: 60    General Examination: The patient is a very pleasant 77 y.o. female in no acute distress. She appears well-developed and well-nourished and well groomed.   HEENT: Normocephalic, atraumatic, pupils are equal, round and reactive to light, extraocular tracking is well-preserved, hearing is grossly intact. Face is symmetric with normal facial animation. Speech is clear with no dysarthria noted. There is no hypophonia. There is no lip, neck/head, jaw or voice tremor. Neck is supple with full range of passive and active motion. There are no carotid bruits on auscultation. Oropharynx exam reveals: Mild mouth dryness, good dental hygiene with full dentures on top and partials on the bottom, tonsils absent. Tongue protrudes centrally and palate elevates symmetrically.    Chest: Clear to auscultation without wheezing, rhonchi or crackles noted.   Heart: S1+S2+0, regular and normal without murmurs, rubs or gallops noted.    Abdomen: Soft, non-tender and non-distended.   Extremities: There is trace edema around the ankles and she is wearing compression stockings up to the knees bilaterally.      Skin: Warm and dry without trophic changes noted.    Musculoskeletal: exam reveals no obvious joint deformities.    Neurologically:  Mental status: The patient is awake, alert and oriented in all 4 spheres. Caroline Blair immediate and remote memory, attention, language skills and fund of knowledge are appropriate. There is no evidence of aphasia, agnosia, apraxia  or anomia. Speech is clear with normal prosody and enunciation. Thought process is linear. Mood is  normal and affect is normal.  Cranial nerves II - XII are as described above under HEENT exam.  Motor exam: Normal bulk, strength and tone is noted. There is no obvious tremor.  Fine motor skills and coordination: grossly intact.  Cerebellar testing: No dysmetria or intention tremor. There is no truncal or gait ataxia.  Sensory exam: intact to light touch in the upper and lower extremities.  Gait, station and balance: She stands easily. No veering to one side is noted. No leaning to one side is noted. Posture is age-appropriate and stance is narrow based. Gait shows normal stride length and normal pace. No problems turning are noted.     Assessment and Plan:  In summary, KALESHA IRVING is a very pleasant 77 year old female with an underlying medical history of TIA in November 2022, anemia, diabetes, hypertension, hyperlipidemia, history of heart murmur, uterine cancer, and borderline overweight state, who presents for follow-up consultation of Caroline Blair obstructive sleep apnea, on treatment with AutoPap therapy since 04/27/2021.  She had a baseline sleep study through our sleep lab on 03/28/2021.  She is fully compliant with treatment and has been benefiting from it.  She is highly commended for treatment adherence.  She is motivated to continue with it.  She is advised to follow-up routinely in sleep clinic to see Ihor Austin, NP in 1 year, we can offer Caroline Blair a video visit as well which she would like to do as well.  She is advised to monitor Caroline Blair rash.  She does have mild swelling in the lower extremities, possibly secondary to amlodipine which was just stopped last week.  She is wearing compression stockings.  She has an appointment with dermatology to discuss a new rash she has noted.  From the sleep apnea standpoint she is doing rather well, I answered all Caroline Blair questions today and she was in agreement with our  plan. I spent 20 minutes in total face-to-face time and in reviewing records during pre-charting, more than 50% of which was spent in counseling and coordination of care, reviewing test results, reviewing medications and treatment regimen and/or in discussing or reviewing the diagnosis of OSA, the prognosis and treatment options. Pertinent laboratory and imaging test results that were available during this visit with the patient were reviewed by me and considered in my medical decision making (see chart for details).   etail.  I answered all Caroline Blair questions today and she was in agreement with our plan.

## 2022-06-21 DIAGNOSIS — H5203 Hypermetropia, bilateral: Secondary | ICD-10-CM | POA: Diagnosis not present

## 2022-06-27 DIAGNOSIS — Z0000 Encounter for general adult medical examination without abnormal findings: Secondary | ICD-10-CM | POA: Diagnosis not present

## 2022-06-27 DIAGNOSIS — C541 Malignant neoplasm of endometrium: Secondary | ICD-10-CM | POA: Diagnosis not present

## 2022-06-27 DIAGNOSIS — E785 Hyperlipidemia, unspecified: Secondary | ICD-10-CM | POA: Diagnosis not present

## 2022-06-27 DIAGNOSIS — N183 Chronic kidney disease, stage 3 unspecified: Secondary | ICD-10-CM | POA: Diagnosis not present

## 2022-06-27 DIAGNOSIS — G459 Transient cerebral ischemic attack, unspecified: Secondary | ICD-10-CM | POA: Diagnosis not present

## 2022-06-27 DIAGNOSIS — I10 Essential (primary) hypertension: Secondary | ICD-10-CM | POA: Diagnosis not present

## 2022-06-27 DIAGNOSIS — G4733 Obstructive sleep apnea (adult) (pediatric): Secondary | ICD-10-CM | POA: Diagnosis not present

## 2022-06-27 DIAGNOSIS — H35033 Hypertensive retinopathy, bilateral: Secondary | ICD-10-CM | POA: Diagnosis not present

## 2022-06-27 DIAGNOSIS — I700 Atherosclerosis of aorta: Secondary | ICD-10-CM | POA: Diagnosis not present

## 2022-06-27 DIAGNOSIS — E1165 Type 2 diabetes mellitus with hyperglycemia: Secondary | ICD-10-CM | POA: Diagnosis not present

## 2022-06-27 DIAGNOSIS — D509 Iron deficiency anemia, unspecified: Secondary | ICD-10-CM | POA: Diagnosis not present

## 2022-07-03 ENCOUNTER — Other Ambulatory Visit: Payer: Self-pay | Admitting: Internal Medicine

## 2022-07-03 DIAGNOSIS — Z1231 Encounter for screening mammogram for malignant neoplasm of breast: Secondary | ICD-10-CM

## 2022-07-20 DIAGNOSIS — C541 Malignant neoplasm of endometrium: Secondary | ICD-10-CM | POA: Diagnosis not present

## 2022-08-04 DIAGNOSIS — H9202 Otalgia, left ear: Secondary | ICD-10-CM | POA: Diagnosis not present

## 2022-08-18 ENCOUNTER — Ambulatory Visit
Admission: RE | Admit: 2022-08-18 | Discharge: 2022-08-18 | Disposition: A | Discharge status: Home / Self Care | Payer: Medicare Other | Source: Ambulatory Visit | Attending: Internal Medicine | Admitting: Internal Medicine

## 2022-08-18 DIAGNOSIS — Z1231 Encounter for screening mammogram for malignant neoplasm of breast: Secondary | ICD-10-CM

## 2022-09-21 DIAGNOSIS — L300 Nummular dermatitis: Secondary | ICD-10-CM | POA: Diagnosis not present

## 2022-10-06 ENCOUNTER — Other Ambulatory Visit: Payer: Self-pay | Admitting: General Surgery

## 2022-10-06 DIAGNOSIS — D490 Neoplasm of unspecified behavior of digestive system: Secondary | ICD-10-CM | POA: Diagnosis not present

## 2022-10-06 DIAGNOSIS — C541 Malignant neoplasm of endometrium: Secondary | ICD-10-CM | POA: Diagnosis not present

## 2022-10-06 DIAGNOSIS — E113393 Type 2 diabetes mellitus with moderate nonproliferative diabetic retinopathy without macular edema, bilateral: Secondary | ICD-10-CM | POA: Diagnosis not present

## 2022-10-13 DIAGNOSIS — I10 Essential (primary) hypertension: Secondary | ICD-10-CM | POA: Diagnosis not present

## 2022-10-13 DIAGNOSIS — Z8673 Personal history of transient ischemic attack (TIA), and cerebral infarction without residual deficits: Secondary | ICD-10-CM | POA: Diagnosis not present

## 2022-10-13 DIAGNOSIS — Z7984 Long term (current) use of oral hypoglycemic drugs: Secondary | ICD-10-CM | POA: Diagnosis not present

## 2022-10-13 DIAGNOSIS — E113593 Type 2 diabetes mellitus with proliferative diabetic retinopathy without macular edema, bilateral: Secondary | ICD-10-CM | POA: Diagnosis not present

## 2022-10-25 ENCOUNTER — Ambulatory Visit
Admission: RE | Admit: 2022-10-25 | Discharge: 2022-10-25 | Disposition: A | Discharge status: Home / Self Care | Payer: Medicare Other | Source: Ambulatory Visit | Attending: General Surgery | Admitting: General Surgery

## 2022-10-25 DIAGNOSIS — D490 Neoplasm of unspecified behavior of digestive system: Secondary | ICD-10-CM

## 2022-10-25 MED ORDER — GADOBUTROL 1 MMOL/ML IV SOLN
7.5000 mL | Freq: Once | INTRAVENOUS | Status: AC | PRN
  Administered 2022-10-25: 7.5 mL via INTRAVENOUS

## 2023-01-01 DIAGNOSIS — E1165 Type 2 diabetes mellitus with hyperglycemia: Secondary | ICD-10-CM | POA: Diagnosis not present

## 2023-01-01 DIAGNOSIS — G4733 Obstructive sleep apnea (adult) (pediatric): Secondary | ICD-10-CM | POA: Diagnosis not present

## 2023-01-01 DIAGNOSIS — N183 Chronic kidney disease, stage 3 unspecified: Secondary | ICD-10-CM | POA: Diagnosis not present

## 2023-01-01 DIAGNOSIS — G459 Transient cerebral ischemic attack, unspecified: Secondary | ICD-10-CM | POA: Diagnosis not present

## 2023-01-01 DIAGNOSIS — E785 Hyperlipidemia, unspecified: Secondary | ICD-10-CM | POA: Diagnosis not present

## 2023-01-01 DIAGNOSIS — I10 Essential (primary) hypertension: Secondary | ICD-10-CM | POA: Diagnosis not present

## 2023-01-01 DIAGNOSIS — L309 Dermatitis, unspecified: Secondary | ICD-10-CM | POA: Diagnosis not present

## 2023-01-01 DIAGNOSIS — D509 Iron deficiency anemia, unspecified: Secondary | ICD-10-CM | POA: Diagnosis not present

## 2023-01-01 DIAGNOSIS — Z23 Encounter for immunization: Secondary | ICD-10-CM | POA: Diagnosis not present

## 2023-04-03 ENCOUNTER — Ambulatory Visit: Admitting: Dermatology

## 2023-04-04 DIAGNOSIS — L309 Dermatitis, unspecified: Secondary | ICD-10-CM | POA: Diagnosis not present

## 2023-04-24 DIAGNOSIS — Z1211 Encounter for screening for malignant neoplasm of colon: Secondary | ICD-10-CM | POA: Diagnosis not present

## 2023-04-24 DIAGNOSIS — Z7902 Long term (current) use of antithrombotics/antiplatelets: Secondary | ICD-10-CM | POA: Diagnosis not present

## 2023-04-24 DIAGNOSIS — Z7901 Long term (current) use of anticoagulants: Secondary | ICD-10-CM | POA: Diagnosis not present

## 2023-04-24 DIAGNOSIS — Z8673 Personal history of transient ischemic attack (TIA), and cerebral infarction without residual deficits: Secondary | ICD-10-CM | POA: Diagnosis not present

## 2023-05-09 DIAGNOSIS — I700 Atherosclerosis of aorta: Secondary | ICD-10-CM | POA: Diagnosis not present

## 2023-05-09 DIAGNOSIS — E1165 Type 2 diabetes mellitus with hyperglycemia: Secondary | ICD-10-CM | POA: Diagnosis not present

## 2023-05-09 DIAGNOSIS — I10 Essential (primary) hypertension: Secondary | ICD-10-CM | POA: Diagnosis not present

## 2023-05-09 DIAGNOSIS — H35033 Hypertensive retinopathy, bilateral: Secondary | ICD-10-CM | POA: Diagnosis not present

## 2023-05-21 DIAGNOSIS — E1165 Type 2 diabetes mellitus with hyperglycemia: Secondary | ICD-10-CM | POA: Diagnosis not present

## 2023-05-21 DIAGNOSIS — G459 Transient cerebral ischemic attack, unspecified: Secondary | ICD-10-CM | POA: Diagnosis not present

## 2023-05-21 DIAGNOSIS — I700 Atherosclerosis of aorta: Secondary | ICD-10-CM | POA: Diagnosis not present

## 2023-05-21 DIAGNOSIS — N183 Chronic kidney disease, stage 3 unspecified: Secondary | ICD-10-CM | POA: Diagnosis not present

## 2023-05-21 DIAGNOSIS — H35033 Hypertensive retinopathy, bilateral: Secondary | ICD-10-CM | POA: Diagnosis not present

## 2023-05-21 DIAGNOSIS — I10 Essential (primary) hypertension: Secondary | ICD-10-CM | POA: Diagnosis not present

## 2023-05-22 DIAGNOSIS — Z7984 Long term (current) use of oral hypoglycemic drugs: Secondary | ICD-10-CM | POA: Diagnosis not present

## 2023-05-22 DIAGNOSIS — Z961 Presence of intraocular lens: Secondary | ICD-10-CM | POA: Diagnosis not present

## 2023-05-22 DIAGNOSIS — E113593 Type 2 diabetes mellitus with proliferative diabetic retinopathy without macular edema, bilateral: Secondary | ICD-10-CM | POA: Diagnosis not present

## 2023-05-22 DIAGNOSIS — H35371 Puckering of macula, right eye: Secondary | ICD-10-CM | POA: Diagnosis not present

## 2023-05-22 DIAGNOSIS — H35033 Hypertensive retinopathy, bilateral: Secondary | ICD-10-CM | POA: Diagnosis not present

## 2023-05-22 DIAGNOSIS — I10 Essential (primary) hypertension: Secondary | ICD-10-CM | POA: Diagnosis not present

## 2023-06-05 ENCOUNTER — Encounter: Payer: Self-pay | Admitting: Adult Health

## 2023-06-05 ENCOUNTER — Telehealth (INDEPENDENT_AMBULATORY_CARE_PROVIDER_SITE_OTHER): Payer: Medicare Other | Admitting: Adult Health

## 2023-06-05 DIAGNOSIS — G4733 Obstructive sleep apnea (adult) (pediatric): Secondary | ICD-10-CM | POA: Diagnosis not present

## 2023-06-05 DIAGNOSIS — Z8673 Personal history of transient ischemic attack (TIA), and cerebral infarction without residual deficits: Secondary | ICD-10-CM | POA: Diagnosis not present

## 2023-06-05 NOTE — Progress Notes (Signed)
 [facility] [address] [place]. [place] [address] [phone]       OFFICE FOLLOW UP NOTE  Ms. ABIR EROH Date of Birth:  Feb 10, 1946 Medical Record Number:  [id number]    Reason for visit: OSA on CPAP     Virtual Visit via Video Note  I connected with Babetta Bologna on 06/05/23 at  3:00 PM EDT by a video enabled telemedicine application and verified that I am speaking with the correct person using two identifiers.  Location: Patient: at home Provider: in office, [facility]   I discussed the limitations of evaluation and management by telemedicine and the availability of in person appointments. The patient expressed understanding and agreed to proceed.   SUBJECTIVE:   Brief HPI:   [name] is a 78 y.o. female who is followed for OSA on CPAP.  PMH of TIA in November 2022, anemia, diabetes, hypertension, hyperlipidemia, history of heart murmur, uterine cancer, and borderline overweight state.  Diagnosed with OSA in 03/2021 and AutoPap therapy initiated.   Interval history:  Returns for yearly CPAP compliance visit.  Continues to do well overall on CPAP therapy.  Continues to use fullface mask.  Continues to experience overall benefit with use of CPAP, can wake up in the middle of the night still and have difficulty falling back asleep.  She recently switched her magnesium oxide from the morning to nighttime which she believes has been helpful. ESS 4/24.  Routinely followed by DME [facility].  Continues to routinely follow with PCP, has follow-up visit next month, no new stroke/TIA symptoms and reports compliance on Plavix and Lipitor.               ROS:   14 system review of systems performed and negative with exception of those listed in HPI  PMH:  Past Medical History:  Diagnosis Date   Anemia    Cataract    Diabetes (HCC)    Heart murmur    Hypertension    TIA (transient ischemic attack)     PSH:  Past Surgical History:   Procedure Laterality Date   DILATION AND CURETTAGE OF UTERUS  2006   ESOPHAGOGASTRODUODENOSCOPY (EGD) WITH PROPOFOL N/A 09/21/2021   Procedure: ESOPHAGOGASTRODUODENOSCOPY (EGD) WITH PROPOFOL;  Surgeon: Evangeline Hilts, MD;  Location: [facility];  Service: Gastroenterology;  Laterality: N/A;   UPPER ESOPHAGEAL ENDOSCOPIC ULTRASOUND (EUS) Bilateral 09/21/2021   Procedure: UPPER ESOPHAGEAL ENDOSCOPIC ULTRASOUND (EUS);  Surgeon: Evangeline Hilts, MD;  Location: [facility];  Service: Gastroenterology;  Laterality: Bilateral;    Social History:  Social History   Socioeconomic History   Marital status: Married    Spouse name: Not on file   Number of children: Not on file   Years of education: Not on file   Highest education level: Not on file  Occupational History   Not on file  Tobacco Use   Smoking status: Never   Smokeless tobacco: Never  Vaping Use   Vaping status: Never Used  Substance and Sexual Activity   Alcohol use: Not Currently    Comment: rare glass wine; none in years however   Drug use: No   Sexual activity: Not on file  Other Topics Concern   Not on file  Social History Narrative   Lives at home with husband    Right handed   Caffeine: tea, 3-4 cups/day   Social Drivers of Health   Financial Resource Strain: Low Risk  (07/17/2022)   Received from [facility], [facility]  Overall Financial Resource Strain (CARDIA)    Difficulty of Paying Living Expenses: Not hard at all  Food Insecurity: No Food Insecurity (07/17/2022)   Received from [facility], [facility]   Hunger Vital Sign    Worried About Running Out of Food in the Last Year: Never true    Ran Out of Food in the Last Year: Never true  Transportation Needs: No Transportation Needs (07/17/2022)   Received from [facility], [facility]   PRAPARE - Transportation    Lack of Transportation (Medical): No    Lack of Transportation (Non-Medical): No  Physical Activity: Insufficiently Active  (07/17/2022)   Received from [facility], [facility]   Exercise Vital Sign    Days of Exercise per Week: 3 days    Minutes of Exercise per Session: 30 min  Stress: No Stress Concern Present (07/17/2022)   Received from [facility], [facility] of [facility] - Occupational Stress Questionnaire    Feeling of Stress : Only a little  Social Connections: Socially Integrated (07/17/2022)   Received from [facility], [facility]   Social Network    How would you rate your social network (family, work, friends)?: Good participation with social networks  Intimate Partner Violence: Not At Risk (07/17/2022)   Received from [facility], [facility]   HITS    Over the last 12 months how often did your partner physically hurt you?: Never    Over the last 12 months how often did your partner insult you or talk down to you?: Never    Over the last 12 months how often did your partner threaten you with physical harm?: Never    Over the last 12 months how often did your partner scream or curse at you?: Never    Family History:  Family History  Problem Relation Age of Onset   Diabetes Father    Colon cancer Neg Hx    Sleep apnea Neg Hx    Breast cancer Neg Hx     Medications:   Current Outpatient Medications on File Prior to Visit  Medication Sig Dispense Refill   acetaminophen (TYLENOL) 500 MG tablet Take 1,000 mg by mouth every 8 (eight) hours as needed (arthritis pain).     amLODipine (NORVASC) 5 MG tablet Take 5 mg by mouth daily. (Patient not taking: Reported on 06/15/2022)     atorvastatin (LIPITOR) 40 MG tablet Take 40 mg by mouth daily.     clopidogrel (PLAVIX) 75 MG tablet Take 1 tablet (75 mg total) by mouth daily. 120 tablet 0   [facility] MAGNESIUM OXIDE 250 MG TABS Take 2 tablets by mouth daily.     Cyanocobalamin (VITAMIN B 12 PO) Take 1,000 mcg by mouth daily.     empagliflozin (JARDIANCE) 25 MG TABS tablet Take 25 mg by mouth daily.     losartan  (COZAAR) 100 MG tablet Take 100 mg by mouth daily.     metFORMIN (GLUCOPHAGE) 1000 MG tablet Take 1,000 mg by mouth 2 (two) times daily with a meal.     No current facility-administered medications on file prior to visit.    Allergies:  No Known Allergies    OBJECTIVE:  Physical Exam General: well developed, well nourished, very pleasant elderly African-American female, seated, in no evident distress  Neurologic Exam Mental Status: Awake and fully alert.  Fluent speech and language.  Oriented to place and time. Recent and remote memory intact. Attention span, concentration and fund of  knowledge appropriate. Mood and affect appropriate.         ASSESSMENT: Caroline Blair is a 78 y.o. year old female who returns for follow-up visit for OSA on CPAP as well as prior history of TIA in 2022.      PLAN:  OSA on CPAP: Compliance report shows satisfactory usage with optimal residual AHI.   Continue current pressure settings of 4-11 with EPR 3 Discussed continued nightly usage with ensuring greater than 4 hours nightly for optimal benefit and per insurance purposes.   Encouraged continued use of magnesium for occasional sleep disruption, advised to follow-up with PCP for further discussion/treatment options if symptoms persist Continue to follow with DME company for any needed supplies or CPAP related concerns  TIA:  no reoccurring symptoms. Continue clopidogrel 75 mg daily  and atorvastatin 40 mg daily for secondary stroke prevention managed/prescribed by PCP Discussed secondary stroke prevention measures and importance of close PCP follow up for aggressive stroke risk factor management including BP goal<130/90, HLD with LDL goal<70 and DM with A1c.<7.     Follow-up in 1 year or call earlier if needed   CC:  PCP: Elester Grim, MD    I spent 25 minutes of face-to-face and non-face-to-face time with patient via MyChart video visit.  This included previsit chart review,  lab review, study review, order entry, electronic health record documentation, patient education and discussion regarding above diagnoses and treatment plan and answered all other questions to patient's satisfaction   Johny Nap, [facility]  [facility] [address] [place], [place] [address]  Phone [phone] Fax [phone] Note: This document was prepared with digital dictation and possible smart phrase technology. Any transcriptional errors that result from this process are unintentional.

## 2023-06-06 NOTE — Progress Notes (Signed)
 Caroline Blair, CMA  Caroline Blair, Caroline Blair; Caroline Blair, Caroline Blair New orders have been placed for the above pt, DOB: 2046-02-18 Thanks

## 2023-06-07 DIAGNOSIS — I10 Essential (primary) hypertension: Secondary | ICD-10-CM | POA: Diagnosis not present

## 2023-06-07 DIAGNOSIS — I700 Atherosclerosis of aorta: Secondary | ICD-10-CM | POA: Diagnosis not present

## 2023-06-07 DIAGNOSIS — H35033 Hypertensive retinopathy, bilateral: Secondary | ICD-10-CM | POA: Diagnosis not present

## 2023-06-07 DIAGNOSIS — E1165 Type 2 diabetes mellitus with hyperglycemia: Secondary | ICD-10-CM | POA: Diagnosis not present

## 2023-06-20 DIAGNOSIS — N183 Chronic kidney disease, stage 3 unspecified: Secondary | ICD-10-CM | POA: Diagnosis not present

## 2023-06-20 DIAGNOSIS — G459 Transient cerebral ischemic attack, unspecified: Secondary | ICD-10-CM | POA: Diagnosis not present

## 2023-06-20 DIAGNOSIS — I10 Essential (primary) hypertension: Secondary | ICD-10-CM | POA: Diagnosis not present

## 2023-06-20 DIAGNOSIS — I700 Atherosclerosis of aorta: Secondary | ICD-10-CM | POA: Diagnosis not present

## 2023-06-20 DIAGNOSIS — H35033 Hypertensive retinopathy, bilateral: Secondary | ICD-10-CM | POA: Diagnosis not present

## 2023-06-20 DIAGNOSIS — E1165 Type 2 diabetes mellitus with hyperglycemia: Secondary | ICD-10-CM | POA: Diagnosis not present

## 2023-07-04 DIAGNOSIS — E113593 Type 2 diabetes mellitus with proliferative diabetic retinopathy without macular edema, bilateral: Secondary | ICD-10-CM | POA: Diagnosis not present

## 2023-07-04 DIAGNOSIS — I700 Atherosclerosis of aorta: Secondary | ICD-10-CM | POA: Diagnosis not present

## 2023-07-04 DIAGNOSIS — D508 Other iron deficiency anemias: Secondary | ICD-10-CM | POA: Diagnosis not present

## 2023-07-04 DIAGNOSIS — Z0000 Encounter for general adult medical examination without abnormal findings: Secondary | ICD-10-CM | POA: Diagnosis not present

## 2023-07-04 DIAGNOSIS — E1165 Type 2 diabetes mellitus with hyperglycemia: Secondary | ICD-10-CM | POA: Diagnosis not present

## 2023-07-04 DIAGNOSIS — G459 Transient cerebral ischemic attack, unspecified: Secondary | ICD-10-CM | POA: Diagnosis not present

## 2023-07-04 DIAGNOSIS — E113393 Type 2 diabetes mellitus with moderate nonproliferative diabetic retinopathy without macular edema, bilateral: Secondary | ICD-10-CM | POA: Diagnosis not present

## 2023-07-04 DIAGNOSIS — K869 Disease of pancreas, unspecified: Secondary | ICD-10-CM | POA: Diagnosis not present

## 2023-07-04 DIAGNOSIS — C541 Malignant neoplasm of endometrium: Secondary | ICD-10-CM | POA: Diagnosis not present

## 2023-07-04 DIAGNOSIS — I10 Essential (primary) hypertension: Secondary | ICD-10-CM | POA: Diagnosis not present

## 2023-07-04 DIAGNOSIS — E785 Hyperlipidemia, unspecified: Secondary | ICD-10-CM | POA: Diagnosis not present

## 2023-07-05 ENCOUNTER — Other Ambulatory Visit: Payer: Self-pay | Admitting: Internal Medicine

## 2023-07-05 DIAGNOSIS — Z1231 Encounter for screening mammogram for malignant neoplasm of breast: Secondary | ICD-10-CM

## 2023-07-07 DIAGNOSIS — I10 Essential (primary) hypertension: Secondary | ICD-10-CM | POA: Diagnosis not present

## 2023-07-07 DIAGNOSIS — I700 Atherosclerosis of aorta: Secondary | ICD-10-CM | POA: Diagnosis not present

## 2023-07-07 DIAGNOSIS — H35033 Hypertensive retinopathy, bilateral: Secondary | ICD-10-CM | POA: Diagnosis not present

## 2023-07-07 DIAGNOSIS — E1165 Type 2 diabetes mellitus with hyperglycemia: Secondary | ICD-10-CM | POA: Diagnosis not present

## 2023-07-19 DIAGNOSIS — C541 Malignant neoplasm of endometrium: Secondary | ICD-10-CM | POA: Diagnosis not present

## 2023-07-21 DIAGNOSIS — I10 Essential (primary) hypertension: Secondary | ICD-10-CM | POA: Diagnosis not present

## 2023-07-21 DIAGNOSIS — N183 Chronic kidney disease, stage 3 unspecified: Secondary | ICD-10-CM | POA: Diagnosis not present

## 2023-07-21 DIAGNOSIS — H35033 Hypertensive retinopathy, bilateral: Secondary | ICD-10-CM | POA: Diagnosis not present

## 2023-07-21 DIAGNOSIS — G459 Transient cerebral ischemic attack, unspecified: Secondary | ICD-10-CM | POA: Diagnosis not present

## 2023-07-21 DIAGNOSIS — I700 Atherosclerosis of aorta: Secondary | ICD-10-CM | POA: Diagnosis not present

## 2023-07-21 DIAGNOSIS — E1165 Type 2 diabetes mellitus with hyperglycemia: Secondary | ICD-10-CM | POA: Diagnosis not present

## 2023-08-06 DIAGNOSIS — E1165 Type 2 diabetes mellitus with hyperglycemia: Secondary | ICD-10-CM | POA: Diagnosis not present

## 2023-08-06 DIAGNOSIS — I10 Essential (primary) hypertension: Secondary | ICD-10-CM | POA: Diagnosis not present

## 2023-08-06 DIAGNOSIS — H35033 Hypertensive retinopathy, bilateral: Secondary | ICD-10-CM | POA: Diagnosis not present

## 2023-08-06 DIAGNOSIS — I700 Atherosclerosis of aorta: Secondary | ICD-10-CM | POA: Diagnosis not present

## 2023-08-20 ENCOUNTER — Ambulatory Visit

## 2023-08-20 DIAGNOSIS — I10 Essential (primary) hypertension: Secondary | ICD-10-CM | POA: Diagnosis not present

## 2023-08-20 DIAGNOSIS — G459 Transient cerebral ischemic attack, unspecified: Secondary | ICD-10-CM | POA: Diagnosis not present

## 2023-08-20 DIAGNOSIS — N183 Chronic kidney disease, stage 3 unspecified: Secondary | ICD-10-CM | POA: Diagnosis not present

## 2023-08-20 DIAGNOSIS — E1165 Type 2 diabetes mellitus with hyperglycemia: Secondary | ICD-10-CM | POA: Diagnosis not present

## 2023-08-20 DIAGNOSIS — I700 Atherosclerosis of aorta: Secondary | ICD-10-CM | POA: Diagnosis not present

## 2023-08-20 DIAGNOSIS — H35033 Hypertensive retinopathy, bilateral: Secondary | ICD-10-CM | POA: Diagnosis not present

## 2023-08-21 ENCOUNTER — Ambulatory Visit: Admitting: Dermatology

## 2023-08-29 ENCOUNTER — Ambulatory Visit
Admission: RE | Admit: 2023-08-29 | Discharge: 2023-08-29 | Disposition: A | Discharge status: Home / Self Care | Source: Ambulatory Visit | Attending: Internal Medicine

## 2023-08-29 DIAGNOSIS — Z1231 Encounter for screening mammogram for malignant neoplasm of breast: Secondary | ICD-10-CM

## 2023-09-05 DIAGNOSIS — H35033 Hypertensive retinopathy, bilateral: Secondary | ICD-10-CM | POA: Diagnosis not present

## 2023-09-05 DIAGNOSIS — I700 Atherosclerosis of aorta: Secondary | ICD-10-CM | POA: Diagnosis not present

## 2023-09-05 DIAGNOSIS — I10 Essential (primary) hypertension: Secondary | ICD-10-CM | POA: Diagnosis not present

## 2023-09-05 DIAGNOSIS — E1165 Type 2 diabetes mellitus with hyperglycemia: Secondary | ICD-10-CM | POA: Diagnosis not present

## 2023-09-20 DIAGNOSIS — E1165 Type 2 diabetes mellitus with hyperglycemia: Secondary | ICD-10-CM | POA: Diagnosis not present

## 2023-09-20 DIAGNOSIS — N183 Chronic kidney disease, stage 3 unspecified: Secondary | ICD-10-CM | POA: Diagnosis not present

## 2023-09-20 DIAGNOSIS — G459 Transient cerebral ischemic attack, unspecified: Secondary | ICD-10-CM | POA: Diagnosis not present

## 2023-09-20 DIAGNOSIS — I10 Essential (primary) hypertension: Secondary | ICD-10-CM | POA: Diagnosis not present

## 2023-09-20 DIAGNOSIS — I700 Atherosclerosis of aorta: Secondary | ICD-10-CM | POA: Diagnosis not present

## 2023-09-20 DIAGNOSIS — H35033 Hypertensive retinopathy, bilateral: Secondary | ICD-10-CM | POA: Diagnosis not present

## 2023-10-05 DIAGNOSIS — E1165 Type 2 diabetes mellitus with hyperglycemia: Secondary | ICD-10-CM | POA: Diagnosis not present

## 2023-10-05 DIAGNOSIS — I700 Atherosclerosis of aorta: Secondary | ICD-10-CM | POA: Diagnosis not present

## 2023-10-05 DIAGNOSIS — H35033 Hypertensive retinopathy, bilateral: Secondary | ICD-10-CM | POA: Diagnosis not present

## 2023-10-05 DIAGNOSIS — I10 Essential (primary) hypertension: Secondary | ICD-10-CM | POA: Diagnosis not present

## 2023-10-12 DIAGNOSIS — C541 Malignant neoplasm of endometrium: Secondary | ICD-10-CM | POA: Diagnosis not present

## 2023-10-12 DIAGNOSIS — D490 Neoplasm of unspecified behavior of digestive system: Secondary | ICD-10-CM | POA: Diagnosis not present

## 2023-10-21 DIAGNOSIS — I700 Atherosclerosis of aorta: Secondary | ICD-10-CM | POA: Diagnosis not present

## 2023-10-21 DIAGNOSIS — I10 Essential (primary) hypertension: Secondary | ICD-10-CM | POA: Diagnosis not present

## 2023-10-21 DIAGNOSIS — G459 Transient cerebral ischemic attack, unspecified: Secondary | ICD-10-CM | POA: Diagnosis not present

## 2023-10-21 DIAGNOSIS — H35033 Hypertensive retinopathy, bilateral: Secondary | ICD-10-CM | POA: Diagnosis not present

## 2023-10-21 DIAGNOSIS — E1165 Type 2 diabetes mellitus with hyperglycemia: Secondary | ICD-10-CM | POA: Diagnosis not present

## 2023-10-21 DIAGNOSIS — N183 Chronic kidney disease, stage 3 unspecified: Secondary | ICD-10-CM | POA: Diagnosis not present

## 2023-11-04 DIAGNOSIS — I700 Atherosclerosis of aorta: Secondary | ICD-10-CM | POA: Diagnosis not present

## 2023-11-04 DIAGNOSIS — I10 Essential (primary) hypertension: Secondary | ICD-10-CM | POA: Diagnosis not present

## 2023-11-04 DIAGNOSIS — E1165 Type 2 diabetes mellitus with hyperglycemia: Secondary | ICD-10-CM | POA: Diagnosis not present

## 2023-11-04 DIAGNOSIS — H35033 Hypertensive retinopathy, bilateral: Secondary | ICD-10-CM | POA: Diagnosis not present

## 2023-11-20 DIAGNOSIS — I700 Atherosclerosis of aorta: Secondary | ICD-10-CM | POA: Diagnosis not present

## 2023-11-20 DIAGNOSIS — H35033 Hypertensive retinopathy, bilateral: Secondary | ICD-10-CM | POA: Diagnosis not present

## 2023-11-20 DIAGNOSIS — N183 Chronic kidney disease, stage 3 unspecified: Secondary | ICD-10-CM | POA: Diagnosis not present

## 2023-11-20 DIAGNOSIS — G459 Transient cerebral ischemic attack, unspecified: Secondary | ICD-10-CM | POA: Diagnosis not present

## 2023-11-20 DIAGNOSIS — I10 Essential (primary) hypertension: Secondary | ICD-10-CM | POA: Diagnosis not present

## 2023-11-20 DIAGNOSIS — E1165 Type 2 diabetes mellitus with hyperglycemia: Secondary | ICD-10-CM | POA: Diagnosis not present

## 2023-12-04 DIAGNOSIS — H35033 Hypertensive retinopathy, bilateral: Secondary | ICD-10-CM | POA: Diagnosis not present

## 2023-12-04 DIAGNOSIS — I700 Atherosclerosis of aorta: Secondary | ICD-10-CM | POA: Diagnosis not present

## 2023-12-04 DIAGNOSIS — I10 Essential (primary) hypertension: Secondary | ICD-10-CM | POA: Diagnosis not present

## 2023-12-04 DIAGNOSIS — E1165 Type 2 diabetes mellitus with hyperglycemia: Secondary | ICD-10-CM | POA: Diagnosis not present

## 2023-12-11 DIAGNOSIS — Z8639 Personal history of other endocrine, nutritional and metabolic disease: Secondary | ICD-10-CM | POA: Diagnosis not present

## 2023-12-11 DIAGNOSIS — R748 Abnormal levels of other serum enzymes: Secondary | ICD-10-CM | POA: Diagnosis not present

## 2023-12-11 DIAGNOSIS — E042 Nontoxic multinodular goiter: Secondary | ICD-10-CM | POA: Diagnosis not present

## 2023-12-11 DIAGNOSIS — Z23 Encounter for immunization: Secondary | ICD-10-CM | POA: Diagnosis not present

## 2023-12-21 DIAGNOSIS — I700 Atherosclerosis of aorta: Secondary | ICD-10-CM | POA: Diagnosis not present

## 2023-12-21 DIAGNOSIS — G459 Transient cerebral ischemic attack, unspecified: Secondary | ICD-10-CM | POA: Diagnosis not present

## 2023-12-21 DIAGNOSIS — E1165 Type 2 diabetes mellitus with hyperglycemia: Secondary | ICD-10-CM | POA: Diagnosis not present

## 2023-12-21 DIAGNOSIS — N183 Chronic kidney disease, stage 3 unspecified: Secondary | ICD-10-CM | POA: Diagnosis not present

## 2023-12-21 DIAGNOSIS — I10 Essential (primary) hypertension: Secondary | ICD-10-CM | POA: Diagnosis not present

## 2023-12-21 DIAGNOSIS — H35033 Hypertensive retinopathy, bilateral: Secondary | ICD-10-CM | POA: Diagnosis not present

## 2023-12-27 DIAGNOSIS — H6122 Impacted cerumen, left ear: Secondary | ICD-10-CM | POA: Diagnosis not present

## 2024-01-08 DIAGNOSIS — G4733 Obstructive sleep apnea (adult) (pediatric): Secondary | ICD-10-CM | POA: Diagnosis not present

## 2024-01-08 DIAGNOSIS — G459 Transient cerebral ischemic attack, unspecified: Secondary | ICD-10-CM | POA: Diagnosis not present

## 2024-01-08 DIAGNOSIS — I10 Essential (primary) hypertension: Secondary | ICD-10-CM | POA: Diagnosis not present

## 2024-01-08 DIAGNOSIS — I700 Atherosclerosis of aorta: Secondary | ICD-10-CM | POA: Diagnosis not present

## 2024-01-08 DIAGNOSIS — E1165 Type 2 diabetes mellitus with hyperglycemia: Secondary | ICD-10-CM | POA: Diagnosis not present

## 2024-01-08 DIAGNOSIS — E785 Hyperlipidemia, unspecified: Secondary | ICD-10-CM | POA: Diagnosis not present

## 2024-01-08 DIAGNOSIS — E113393 Type 2 diabetes mellitus with moderate nonproliferative diabetic retinopathy without macular edema, bilateral: Secondary | ICD-10-CM | POA: Diagnosis not present

## 2024-01-08 DIAGNOSIS — D508 Other iron deficiency anemias: Secondary | ICD-10-CM | POA: Diagnosis not present

## 2024-01-08 DIAGNOSIS — C541 Malignant neoplasm of endometrium: Secondary | ICD-10-CM | POA: Diagnosis not present

## 2024-06-05 ENCOUNTER — Ambulatory Visit: Admitting: Adult Health

## 2024-06-12 ENCOUNTER — Ambulatory Visit (HOSPITAL_BASED_OUTPATIENT_CLINIC_OR_DEPARTMENT_OTHER): Admitting: Cardiovascular Disease
